# Patient Record
Sex: Male | Born: 1960 | Race: Black or African American | Hispanic: No | Marital: Married | Smoking: Former smoker
Health system: Southern US, Community
[De-identification: ages and names within clinical notes are randomized; demographics above are authoritative.]

## PROBLEM LIST (undated history)

## (undated) DIAGNOSIS — G8929 Other chronic pain: Secondary | ICD-10-CM

## (undated) DIAGNOSIS — I10 Essential (primary) hypertension: Secondary | ICD-10-CM

## (undated) DIAGNOSIS — E785 Hyperlipidemia, unspecified: Secondary | ICD-10-CM

## (undated) DIAGNOSIS — M545 Low back pain, unspecified: Secondary | ICD-10-CM

## (undated) DIAGNOSIS — K219 Gastro-esophageal reflux disease without esophagitis: Secondary | ICD-10-CM

## (undated) DIAGNOSIS — M1990 Unspecified osteoarthritis, unspecified site: Secondary | ICD-10-CM

## (undated) DIAGNOSIS — T7840XA Allergy, unspecified, initial encounter: Secondary | ICD-10-CM

## (undated) DIAGNOSIS — E119 Type 2 diabetes mellitus without complications: Secondary | ICD-10-CM

## (undated) DIAGNOSIS — J302 Other seasonal allergic rhinitis: Secondary | ICD-10-CM

## (undated) HISTORY — DX: Essential (primary) hypertension: I10

## (undated) HISTORY — DX: Type 2 diabetes mellitus without complications: E11.9

## (undated) HISTORY — PX: ANKLE SURGERY: SHX546

## (undated) HISTORY — DX: Allergy, unspecified, initial encounter: T78.40XA

## (undated) HISTORY — PX: JOINT REPLACEMENT: SHX530

## (undated) HISTORY — PX: DENTAL SURGERY: SHX609

## (undated) HISTORY — DX: Hyperlipidemia, unspecified: E78.5

---

## 1999-07-02 ENCOUNTER — Emergency Department (HOSPITAL_COMMUNITY): Admission: EM | Admit: 1999-07-02 | Discharge: 1999-07-02 | Payer: Self-pay | Admitting: Internal Medicine

## 2001-10-23 ENCOUNTER — Encounter: Payer: Self-pay | Admitting: Emergency Medicine

## 2001-10-23 ENCOUNTER — Emergency Department (HOSPITAL_COMMUNITY): Admission: EM | Admit: 2001-10-23 | Discharge: 2001-10-23 | Payer: Self-pay | Admitting: Emergency Medicine

## 2002-04-20 ENCOUNTER — Ambulatory Visit (HOSPITAL_COMMUNITY): Admission: RE | Admit: 2002-04-20 | Discharge: 2002-04-20 | Payer: Self-pay | Admitting: Family Medicine

## 2002-04-20 ENCOUNTER — Encounter: Payer: Self-pay | Admitting: Family Medicine

## 2004-08-06 ENCOUNTER — Emergency Department (HOSPITAL_COMMUNITY): Admission: EM | Admit: 2004-08-06 | Discharge: 2004-08-06 | Payer: Self-pay | Admitting: *Deleted

## 2004-09-19 ENCOUNTER — Ambulatory Visit: Payer: Self-pay | Admitting: Internal Medicine

## 2004-10-09 ENCOUNTER — Ambulatory Visit (HOSPITAL_COMMUNITY): Admission: RE | Admit: 2004-10-09 | Discharge: 2004-10-09 | Payer: Self-pay | Admitting: Orthopedic Surgery

## 2004-10-09 ENCOUNTER — Ambulatory Visit (HOSPITAL_BASED_OUTPATIENT_CLINIC_OR_DEPARTMENT_OTHER): Admission: RE | Admit: 2004-10-09 | Discharge: 2004-10-09 | Payer: Self-pay | Admitting: Orthopedic Surgery

## 2004-11-01 ENCOUNTER — Encounter: Admission: RE | Admit: 2004-11-01 | Discharge: 2004-12-10 | Payer: Self-pay | Admitting: Orthopedic Surgery

## 2005-03-05 ENCOUNTER — Encounter: Admission: RE | Admit: 2005-03-05 | Discharge: 2005-03-22 | Payer: Self-pay | Admitting: Orthopedic Surgery

## 2005-08-03 ENCOUNTER — Emergency Department (HOSPITAL_COMMUNITY): Admission: EM | Admit: 2005-08-03 | Discharge: 2005-08-03 | Payer: Self-pay | Admitting: Emergency Medicine

## 2005-08-05 ENCOUNTER — Ambulatory Visit: Payer: Self-pay | Admitting: Internal Medicine

## 2006-02-19 ENCOUNTER — Emergency Department (HOSPITAL_COMMUNITY): Admission: EM | Admit: 2006-02-19 | Discharge: 2006-02-19 | Payer: Self-pay | Admitting: Family Medicine

## 2006-02-27 ENCOUNTER — Ambulatory Visit: Payer: Self-pay | Admitting: Internal Medicine

## 2006-05-07 ENCOUNTER — Ambulatory Visit: Payer: Self-pay | Admitting: Internal Medicine

## 2006-07-04 ENCOUNTER — Emergency Department (HOSPITAL_COMMUNITY): Admission: EM | Admit: 2006-07-04 | Discharge: 2006-07-04 | Payer: Self-pay | Admitting: Family Medicine

## 2006-10-07 ENCOUNTER — Ambulatory Visit: Payer: Self-pay | Admitting: Internal Medicine

## 2006-12-30 HISTORY — PX: ANKLE ARTHROSCOPY W/ OPEN REPAIR: SHX1145

## 2007-11-17 ENCOUNTER — Emergency Department (HOSPITAL_COMMUNITY): Admission: EM | Admit: 2007-11-17 | Discharge: 2007-11-17 | Payer: Self-pay | Admitting: Emergency Medicine

## 2007-12-07 ENCOUNTER — Emergency Department (HOSPITAL_COMMUNITY): Admission: EM | Admit: 2007-12-07 | Discharge: 2007-12-07 | Payer: Self-pay | Admitting: Emergency Medicine

## 2008-01-04 ENCOUNTER — Emergency Department (HOSPITAL_COMMUNITY): Admission: EM | Admit: 2008-01-04 | Discharge: 2008-01-04 | Payer: Self-pay | Admitting: *Deleted

## 2008-01-08 ENCOUNTER — Ambulatory Visit: Payer: Self-pay | Admitting: *Deleted

## 2008-01-08 ENCOUNTER — Ambulatory Visit: Payer: Self-pay | Admitting: Internal Medicine

## 2008-01-20 ENCOUNTER — Ambulatory Visit: Payer: Self-pay | Admitting: Internal Medicine

## 2008-01-20 LAB — CONVERTED CEMR LAB
BUN: 18 mg/dL (ref 6–23)
CO2: 23 meq/L (ref 19–32)
Cholesterol: 205 mg/dL — ABNORMAL HIGH (ref 0–200)
Glucose, Bld: 97 mg/dL (ref 70–99)
LDL Cholesterol: 109 mg/dL — ABNORMAL HIGH (ref 0–99)
Potassium: 4.6 meq/L (ref 3.5–5.3)
VLDL: 19 mg/dL (ref 0–40)

## 2008-03-15 ENCOUNTER — Ambulatory Visit: Payer: Self-pay | Admitting: Internal Medicine

## 2008-09-14 ENCOUNTER — Ambulatory Visit: Payer: Self-pay | Admitting: Internal Medicine

## 2008-12-14 ENCOUNTER — Ambulatory Visit: Payer: Self-pay | Admitting: Internal Medicine

## 2008-12-14 LAB — CONVERTED CEMR LAB
BUN: 13 mg/dL (ref 6–23)
Chloride: 105 meq/L (ref 96–112)
Potassium: 3.9 meq/L (ref 3.5–5.3)
Sodium: 140 meq/L (ref 135–145)

## 2009-01-24 ENCOUNTER — Emergency Department (HOSPITAL_COMMUNITY): Admission: EM | Admit: 2009-01-24 | Discharge: 2009-01-24 | Payer: Self-pay | Admitting: Emergency Medicine

## 2009-06-16 ENCOUNTER — Ambulatory Visit: Payer: Self-pay | Admitting: Family Medicine

## 2010-09-29 ENCOUNTER — Emergency Department (HOSPITAL_COMMUNITY): Admission: EM | Admit: 2010-09-29 | Discharge: 2010-09-29 | Payer: Self-pay | Admitting: Family Medicine

## 2011-01-25 ENCOUNTER — Emergency Department (HOSPITAL_COMMUNITY)
Admission: EM | Admit: 2011-01-25 | Discharge: 2011-01-25 | Payer: Self-pay | Source: Home / Self Care | Admitting: Family Medicine

## 2011-03-18 ENCOUNTER — Emergency Department (HOSPITAL_COMMUNITY)
Admission: EM | Admit: 2011-03-18 | Discharge: 2011-03-18 | Disposition: A | Discharge status: Home / Self Care | Payer: Self-pay | Attending: Emergency Medicine | Admitting: Emergency Medicine

## 2011-03-18 DIAGNOSIS — Q638 Other specified congenital malformations of kidney: Secondary | ICD-10-CM | POA: Insufficient documentation

## 2011-03-18 DIAGNOSIS — M25569 Pain in unspecified knee: Secondary | ICD-10-CM | POA: Insufficient documentation

## 2011-03-18 DIAGNOSIS — M109 Gout, unspecified: Secondary | ICD-10-CM | POA: Insufficient documentation

## 2011-03-18 DIAGNOSIS — M7989 Other specified soft tissue disorders: Secondary | ICD-10-CM | POA: Insufficient documentation

## 2011-03-18 DIAGNOSIS — I10 Essential (primary) hypertension: Secondary | ICD-10-CM | POA: Insufficient documentation

## 2011-05-17 NOTE — Op Note (Signed)
[name], [name]               ACCOUNT NO.:  [id number]   MEDICAL RECORD NO.:  [id number]          PATIENT TYPE:  AMB   LOCATION:  DSC                          FACILITY:  [facility]   PHYSICIAN:  Leonides Grills, M.D.     DATE OF BIRTH:  [birth year]   DATE OF PROCEDURE:  10/09/2004  DATE OF DISCHARGE:                                 OPERATIVE REPORT   PREOPERATIVE DIAGNOSES:  1.  Right ankle impingement.  2.  Right tibial spurs.  3.  Right talar spurs.   POSTOPERATIVE DIAGNOSES:  1.  Right ankle impingement.  2.  Right tibial spurs.  3.  Right talar spurs.   OPERATION:  1.  Right ankle arthroscopy with extensive debridement.  2.  Excision, right tibial spurs.  3.  Excision, right talar spurs.  4.  Stress x-rays, right ankle.   ANESTHESIA:  General endotracheal tube.   SURGEON:  Leonides Grills, M.D.   ASSISTANT:  Lianne Cure, P.A.   ESTIMATED BLOOD LOSS:  Minimal.   TOURNIQUET:  None.   DISPOSITION:  Stable to the PAR.   INDICATIONS:  This is a 50 year old male who has had longstanding  progressive anterior ankle pain that was interfering with his life to the  point that he could not do what he wants to do.  He was consented for the  above procedure.  All risks, which include infection, nerve or vessel  injury, persistent pain, worsening pain, stiffness, arthritis, and possible  future surgery, which included fusion, were all explained, and questions  were encouraged and answered.   OPERATION:  The patient was brought to the operating room and placed in  supine position after adequate general endotracheal tube anesthesia was  administered as well as Ancef 1 g IV piggyback.  The right lower extremity  was then prepped and draped in a sterile manner over a proximally-placed  thigh tourniquet, which was not used, after a bump was placed on the  ipsilateral hip and the leg was placed on an arthroscopy leg holder.  We  started the procedure by mapping out the anatomical  landmarks, including  anterior tibialis peroneus tertius, and superficial peroneal nerve.  A  spinal needle was then placed through the anterior medial aspect of the  ankle just medial to the anterior tibialis.  Normal saline 20 mL was placed  in the ankle and nick and spread technique was utilized to create the  anterior medial portal.  A blunt-tipped trocar with cannula followed by  camera was then placed in the ankle under direct visualization.  The  anterolateral portal was then created using a spinal needle just lateral to  the peroneus tertius and superficial peroneal nerve and a nick and spread  technique was then utilized to create the portal as well.  There was a  tremendous amount of synovitis in the entire anterior aspect of the ankle.  This was then extensively debrided with a bevel as well as a shaver,  including the entire anterior aspect of the ankle as well as the gutters.  The cartilage had a calcinosis-type appearance similar to gout  as well as  post-cortisone injection.  Once this was extensively debrided and spurs in  both the tibia and talus were exposed nicely, we then used a bur to bur down  the spurs.  This took quite a bit of time to get each individual spur on  both the distal anterior tibia as well as dorsal talar neck.  Stress x-rays  were obtained with the mini C-arm in dorsiflexion and that showed that the  ankle was adequately decompressed.  All bony fragments were obviously  removed from the joint.  Hemostasis was obtained.  Pictures were obtained  throughout the procedure as well.  Cameras were removed.  The wounds were  closed with 4-0 nylon suture.  A sterile dressing was applied.  A Cam walker  boot was applied.  The patient was stable to the PAR.       PB/MEDQ  D:  10/09/2004  T:  10/09/2004  Job:  [id number]

## 2012-01-15 ENCOUNTER — Encounter: Payer: Self-pay | Admitting: Gastroenterology

## 2012-01-22 ENCOUNTER — Ambulatory Visit (AMBULATORY_SURGERY_CENTER): Payer: Self-pay | Admitting: *Deleted

## 2012-01-22 VITALS — Ht 71.0 in | Wt 241.2 lb

## 2012-01-22 DIAGNOSIS — Z1211 Encounter for screening for malignant neoplasm of colon: Secondary | ICD-10-CM

## 2012-01-22 MED ORDER — MOVIPREP 100 G PO SOLR: ORAL | Status: DC

## 2012-01-28 ENCOUNTER — Telehealth: Payer: Self-pay | Admitting: Gastroenterology

## 2012-01-28 NOTE — Telephone Encounter (Signed)
Spoke with pt's wife Angelique Blonder). Pt is unable to afford the Moviprep. Advised wife to come to our office to pick up the voucher for a free Moviprep. She understood and will come pick up the voucher today. She will call back if they have further questions. Ulis Rias RN

## 2012-01-28 NOTE — Telephone Encounter (Signed)
No answer at home number.   Left message on cell number.   

## 2012-02-03 ENCOUNTER — Encounter: Payer: Self-pay | Admitting: Gastroenterology

## 2012-02-03 ENCOUNTER — Ambulatory Visit (AMBULATORY_SURGERY_CENTER): Payer: Self-pay | Admitting: Gastroenterology

## 2012-02-03 VITALS — BP 134/95 | HR 79 | Temp 98.3°F | Resp 13 | Ht 71.0 in | Wt 241.0 lb

## 2012-02-03 DIAGNOSIS — D126 Benign neoplasm of colon, unspecified: Secondary | ICD-10-CM

## 2012-02-03 DIAGNOSIS — Z1211 Encounter for screening for malignant neoplasm of colon: Secondary | ICD-10-CM

## 2012-02-03 MED ORDER — SODIUM CHLORIDE 0.9 % IV SOLN: 500.0000 mL | INTRAVENOUS | Status: DC

## 2012-02-03 NOTE — Patient Instructions (Signed)
FOLLOW DISCHARGE INSTRUCTIONS (BLUE AND GREEN SHEETS).. 

## 2012-02-03 NOTE — Progress Notes (Signed)
Patient did not experience any of the following events: a burn prior to discharge; a fall within the facility; wrong site/side/patient/procedure/implant event; or a hospital transfer or hospital admission upon discharge from the facility. (G8907) Patient did not have preoperative order for IV antibiotic SSI prophylaxis. (G8918)  

## 2012-02-03 NOTE — Op Note (Signed)
[facility] [address] [place], [place]  [address]  COLONOSCOPY PROCEDURE REPORT  PATIENT:  [name], [name]  MR#:  [id number] BIRTHDATE:  07/25/1961, 50 yrs. old  GENDER:  male ENDOSCOPIST:  Barbette Hair. Arlyce Dice, MD REF. BY:  Dow Adolph, M.D. PROCEDURE DATE:  02/03/2012 PROCEDURE:  Colonoscopy with snare polypectomy ASA CLASS:  Class I INDICATIONS:  Routine Risk Screening MEDICATIONS:   MAC sedation, administered by CRNA propofol 350mg IV  DESCRIPTION OF PROCEDURE:   After the risks benefits and alternatives of the procedure were thoroughly explained, informed consent was obtained.  Digital rectal exam was performed and revealed no abnormalities.   The [id number] endoscope was introduced through the anus and advanced to the cecum, which was identified by both the appendix and ileocecal valve, without limitations.  The quality of the prep was excellent, using MoviPrep.  The instrument was then slowly withdrawn as the colon was fully examined. <<PROCEDUREIMAGES>>  FINDINGS:  A sessile polyp was found in the ascending colon. It was 2 - 3 mm in size. Polyp was snared without cautery. Retrieval was successful (see image1). snare polyp  A sessile polyp was found in the descending colon. It was 4 mm in size. Polyp was snared without cautery. Retrieval was successful (see image3). snare polyp  This was otherwise a normal examination of the colon (see image2 and image4).   Retroflexed views in the rectum revealed no abnormalities.    The time to cecum =  1) 1.50 minutes. The scope was then withdrawn in  1) 12.0  minutes from the cecum and the procedure completed. COMPLICATIONS:  None ENDOSCOPIC IMPRESSION: 1) 2 - 3 mm sessile polyp in the ascending colon 2) 4 mm sessile polyp in the descending colon 3) Otherwise normal examination RECOMMENDATIONS: 1) If the polyp(s) removed today are proven to be adenomatous (pre-cancerous) polyps, you will need a repeat colonoscopy  in 5 years. Otherwise you should continue to follow colorectal cancer screening guidelines for "routine risk" patients with colonoscopy in 10 years. REPEAT EXAM:   You will receive a letter from Dr. Arlyce Dice in 1-2 weeks, after reviewing the final pathology, with followup recommendations.  ______________________________ Barbette Hair Arlyce Dice, MD  CC:  n. eSIGNED:   Barbette Hair. Kaplan at 02/03/2012 02:44 PM  Harder, Gaspar Garbe, [phone]

## 2012-02-04 ENCOUNTER — Telehealth: Payer: Self-pay

## 2012-02-04 NOTE — Telephone Encounter (Signed)
  Follow up Call-  Call back number 02/03/2012  Post procedure Call Back phone  # [phone] hm  Permission to leave phone message Yes     Patient questions:  Do you have a fever, pain , or abdominal swelling? no Pain Score  0 *  Have you tolerated food without any problems? no  Have you been able to return to your normal activities? yes  Do you have any questions about your discharge instructions: Diet   no Medications  no Follow up visit  no  Do you have questions or concerns about your Care? no  Actions: * If pain score is 4 or above: No action needed, pain <4.  I spoke with the pt's wife he had left for the morning.  No problems noted. Maw

## 2012-02-10 ENCOUNTER — Encounter: Payer: Self-pay | Admitting: Gastroenterology

## 2012-05-20 ENCOUNTER — Encounter (HOSPITAL_COMMUNITY): Payer: Self-pay | Admitting: *Deleted

## 2012-05-20 ENCOUNTER — Emergency Department (HOSPITAL_COMMUNITY)
Admission: EM | Admit: 2012-05-20 | Discharge: 2012-05-20 | Disposition: A | Discharge status: Home / Self Care | Payer: Self-pay | Source: Home / Self Care | Attending: Family Medicine | Admitting: Family Medicine

## 2012-05-20 DIAGNOSIS — K047 Periapical abscess without sinus: Secondary | ICD-10-CM

## 2012-05-20 MED ORDER — AMOXICILLIN 500 MG PO CAPS: 500.0000 mg | ORAL_CAPSULE | Freq: Three times a day (TID) | ORAL | Status: AC

## 2012-05-20 MED ORDER — AMOXICILLIN 500 MG PO CAPS: 500.0000 mg | ORAL_CAPSULE | Freq: Three times a day (TID) | ORAL | Status: DC

## 2012-05-20 NOTE — ED Notes (Signed)
Pt with c/o right upper tooth pain onset x 2 days - taking ibuprofen without relief - does not have a dentist -

## 2012-05-20 NOTE — ED Provider Notes (Signed)
History     CSN: [id number]  Arrival date & time 05/20/12  1315   First MD Initiated Contact with Patient 05/20/12 1359      Chief Complaint  Patient presents with  . Dental Pain  . Facial Swelling    (Consider location/radiation/quality/duration/timing/severity/associated sxs/prior treatment) Patient is a 51 y.o. male presenting with tooth pain. The history is provided by the patient. No language interpreter was used.  Dental PainThe primary symptoms include mouth pain. The symptoms began 2 days ago. The symptoms are worsening. The symptoms are new. The symptoms occur constantly.  Additional symptoms include: gum swelling and gum tenderness.  Pt request antibiotic.  Pt reports swelling to upper mouth.    Past Medical History  Diagnosis Date  . Allergy   . Hypertension   . Gout     Past Surgical History  Procedure Date  . Ankle arthroscopy w/ open repair 2008    both ankles    Family History  Problem Relation Age of Onset  . Heart disease Mother   . Diabetes Mother   . Liver disease Father   . Diabetes Sister   . Colon polyps Neg Hx   . Colon cancer Neg Hx   . Esophageal cancer Neg Hx   . Stomach cancer Neg Hx     History  Substance Use Topics  . Smoking status: Current Everyday Smoker -- 0.4 packs/day for 32 years    Types: Cigarettes  . Smokeless tobacco: Former Neurosurgeon  . Alcohol Use: 14.4 oz/week    24 Cans of beer per week      Review of Systems  Allergies  Review of patient's allergies indicates no known allergies.  Home Medications   Current Outpatient Rx  Name Route Sig Dispense Refill  . ALLOPURINOL 100 MG PO TABS Oral Take 100 mg by mouth daily at 2 PM daily at 2 PM. Takes two tablets daily    . COLCHICINE PO Oral Take by mouth.    Marland Kitchen DILTIAZEM HCL ER BEADS 120 MG PO CP24 Oral Take 120 mg by mouth daily.    Marland Kitchen LORATADINE 10 MG PO TABS Oral Take 10 mg by mouth daily.    . INDOMETHACIN 25 MG PO CAPS Oral Take 25 mg by mouth 3 (three) times  daily with meals as needed.      BP 156/106  Pulse 86  Temp(Src) 98.6 F (37 C) (Oral)  Resp 16  SpO2 98%  Physical Exam  Nursing note and vitals reviewed. Constitutional: He appears well-developed and well-nourished.  HENT:  Head: Normocephalic and atraumatic.  Right Ear: External ear normal.  Left Ear: External ear normal.  Nose: Nose normal.  Mouth/Throat: Oropharynx is clear and moist.       Swollen right upper gumline  Eyes: Conjunctivae and EOM are normal. Pupils are equal, round, and reactive to light.  Neck: Normal range of motion. Neck supple.  Cardiovascular: Normal rate.   Pulmonary/Chest: Effort normal.    ED Course  Procedures (including critical care time)  Labs Reviewed - No data to display No results found.   1. Dental abscess       MDM  Rx for amoxicillian.          Lonia Skinner [place], [place] 05/20/12 1505

## 2012-05-20 NOTE — ED Notes (Signed)
Pt did not take bp medication this morning

## 2012-05-20 NOTE — Discharge Instructions (Signed)
Abscessed Tooth A tooth abscess is a collection of infected fluid (pus) from a bacterial infection in the inner part of the tooth (pulp). It usually occurs at the end of the tooth's root.  CAUSES   A very bad cavity (extensive tooth decay).   Trauma to the tooth, such as a broken or chipped tooth, that allows bacteria to enter into the pulp.  SYMPTOMS  Severe pain in and around the infected tooth.   Swelling and redness around the abscessed tooth or in the mouth or face.   Tenderness.   Pus drainage.   Bad breath.   Bitter taste in the mouth.   Difficulty swallowing.   Difficulty opening the mouth.   Feeling sick to your stomach (nauseous).   Vomiting.   Chills.   Swollen neck glands.  DIAGNOSIS  A medical and dental history will be taken.   An examination will be performed by tapping on the abscessed tooth.   X-rays may be taken of the tooth to identify the abscess.  TREATMENT The goal of treatment is to eliminate the infection.   You may be prescribed antibiotic medicine to stop the infection from spreading.   A root canal may be performed to save the tooth. If the tooth cannot be saved, it may be pulled (extracted) and the abscess may be drained.  HOME CARE INSTRUCTIONS  Only take over-the-counter or prescription medicines for pain, fever, or discomfort as directed by your caregiver.   Do not drive after taking pain medicine (narcotics).   Rinse your mouth (gargle) often with salt water ( tsp salt in 8 oz of warm water) to relieve pain or swelling.   Do not apply heat to the outside of your face.   Return to your dentist for further treatment as directed.  SEEK IMMEDIATE DENTAL CARE IF:  You have a temperature by mouth above 102 F (38.9 C), not controlled by medicine.   You have chills or a very bad headache.   You have problems breathing or swallowing.   Your have trouble opening your mouth.   You develop swelling in the neck or around the eye.    Your pain is not helped by medicine.   Your pain is getting worse instead of better.  Document Released: 12/16/2005 Document Revised: 12/05/2011 Document Reviewed: 03/26/2011 ExitCare Patient Information 2012 [employer].Dental Abscess A dental abscess usually starts from an infected tooth. Antibiotic medicine and pain pills can be helpful, but dental infections require the attention of a dentist. Rinse around the infected area often with salt water (a pinch of salt in 8 oz of warm water). Do not apply heat to the outside of your face. See your dentist or oral surgeon as soon as possible.  SEEK IMMEDIATE MEDICAL CARE IF:  You have increasing, severe pain that is not relieved by medicine.   You or your child has an oral temperature above 102 F (38.9 C), not controlled by medicine.   Your baby is older than 3 months with a rectal temperature of 102 F (38.9 C) or higher.   Your baby is 3 months old or younger with a rectal temperature of 100.4 F (38 C) or higher.   You develop chills, severe headache, difficulty breathing, or trouble swallowing.   You have swelling in the neck or around the eye.  Document Released: 12/16/2005 Document Revised: 12/05/2011 Document Reviewed: 05/27/2007 Exit[employer] Patient Information 2012 [employer]. 

## 2012-06-11 NOTE — ED Provider Notes (Signed)
Medical screening examination/treatment/procedure(s) were performed by resident physician or non-physician practitioner and as supervising physician I was immediately available for consultation/collaboration.   Barkley Bruns MD.    Linna Hoff, MD 06/11/12 [phone]

## 2013-04-12 ENCOUNTER — Emergency Department (HOSPITAL_COMMUNITY)
Admission: EM | Admit: 2013-04-12 | Discharge: 2013-04-12 | Disposition: A | Discharge status: Home Health | Payer: No Typology Code available for payment source | Source: Home / Self Care

## 2013-04-12 ENCOUNTER — Encounter (HOSPITAL_COMMUNITY): Payer: Self-pay | Admitting: *Deleted

## 2013-04-12 DIAGNOSIS — L0390 Cellulitis, unspecified: Secondary | ICD-10-CM

## 2013-04-12 MED ORDER — TRAMADOL HCL 50 MG PO TABS: 50.0000 mg | ORAL_TABLET | Freq: Four times a day (QID) | ORAL | Status: DC | PRN

## 2013-04-12 MED ORDER — DOXYCYCLINE HYCLATE 100 MG PO TABS: 100.0000 mg | ORAL_TABLET | Freq: Two times a day (BID) | ORAL | Status: DC

## 2013-04-12 NOTE — ED Notes (Signed)
Patient complains of right knee pain that has gotten worse in the last week. Patient states he usually takes an injection for his knee.

## 2013-04-12 NOTE — ED Provider Notes (Signed)
History     CSN: [id number]  Arrival date & time 04/12/13  1019   First MD Initiated Contact with Patient 04/12/13 1040      Chief Complaint  Patient presents with  . Knee Pain    (Consider location/radiation/quality/duration/timing/severity/associated sxs/prior treatment) HPI Patient is a 52 year old male who presents to clinic with main concern of right lower extremity swelling, tenderness to touch, redness and warmth to touch, started one week prior to this visit and has been progressively worse. 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 Patient explains that pain is all, constant, 10 out of 10 in severity, extending from ankle to knee, no similar events in the past, worse with ambulation.  Past Medical History  Diagnosis Date  . Allergy   . Hypertension   . Gout     Past Surgical History  Procedure Laterality Date  . Ankle arthroscopy w/ open repair  2008    both ankles    Family History  Problem Relation Age of Onset  . Heart disease Mother   . Diabetes Mother   . Liver disease Father   . Diabetes Sister   . Colon polyps Neg Hx   . Colon cancer Neg Hx   . Esophageal cancer Neg Hx   . Stomach cancer Neg Hx     History  Substance Use Topics  . Smoking status: Current Every Day Smoker -- 0.40 packs/day for 32 years    Types: Cigarettes  . Smokeless tobacco: Former Neurosurgeon  . Alcohol Use: 14.4 oz/week    24 Cans of beer per week      Review of Systems  Constitutional: Positive for fever, chills, activity change, and fatigue.  HENT: Negative for ear pain, nosebleeds, congestion, facial swelling, rhinorrhea, neck pain, neck stiffness and ear discharge.   Eyes: Negative for pain, discharge, redness, itching and visual disturbance.  Respiratory: Negative for cough, choking, chest tightness, shortness of breath, wheezing and stridor.   Cardiovascular: Negative for chest  pain, palpitations and leg swelling.  Gastrointestinal: Negative for abdominal distention.  Genitourinary: Negative for dysuria, urgency, frequency, hematuria, flank pain, decreased urine volume, difficulty urinating and dyspareunia.  Musculoskeletal: Negative for back pain Neurological: Negative for dizziness, tremors, seizures, syncope, facial asymmetry, speech difficulty, weakness, light-headedness, numbness and headaches.  Hematological: Negative for adenopathy. Does not bruise/bleed easily.  Psychiatric/Behavioral: Negative for hallucinations, behavioral problems, confusion, dysphoric mood, decreased concentration and agitation.    Allergies  Review of patient's allergies indicates no known allergies.  Home Medications   Current Outpatient Rx  Name  Route  Sig  Dispense  Refill  . allopurinol (ZYLOPRIM) 100 MG tablet   Oral   Take 100 mg by mouth daily at 2 PM daily at 2 PM. Takes two tablets daily         . COLCHICINE PO   Oral   Take by mouth.         . diltiazem (TIAZAC) 120 MG 24 hr capsule   Oral   Take 120 mg by mouth daily.         . indomethacin (INDOCIN) 25 MG capsule   Oral   Take 25 mg by mouth 3 (three) times daily with meals as needed.         . loratadine (CLARITIN) 10 MG tablet   Oral   Take 10 mg by mouth daily.         Marland Kitchen  doxycycline (VIBRA-TABS) 100 MG tablet   Oral   Take 1 tablet (100 mg total) by mouth 2 (two) times daily.   20 tablet   0   . traMADol (ULTRAM) 50 MG tablet   Oral   Take 1 tablet (50 mg total) by mouth every 6 (six) hours as needed for pain.   65 tablet   0     BP 152/108  Pulse 83  Temp(Src) 98.8 F (37.1 C) (Oral)  Resp 14  SpO2 98%  Physical Exam  Constitutional: Appears well-developed and well-nourished. No distress.  HENT: Normocephalic. External right and left ear normal. Oropharynx is clear and moist.  Eyes: Conjunctivae and EOM are normal. PERRLA, no scleral icterus.  Neck: Normal ROM. Neck  supple. No JVD. No tracheal deviation. No thyromegaly.  CVS: RRR, S1/S2 +, no murmurs, no gallops, no carotid bruit.  Pulmonary: Effort and breath sounds normal, no stridor, rhonchi, wheezes, rales.  Abdominal: Soft. BS +,  no distension, tenderness, rebound or guarding.  Musculoskeletal: Normal range of motion. Right lower extremity swelling, erythema, tenderness to palpation and 2+ pitting edema extending from the knee to the ankle  Lymphadenopathy: No lymphadenopathy noted, cervical, inguinal. Neuro: Alert. Normal reflexes, muscle tone coordination. No cranial nerve deficit. Skin: Skin is warm and dry. No rash noted. Not diaphoretic. No erythema. No pallor.  Psychiatric: Normal mood and affect. Behavior, judgment, thought content normal.    ED Course  Procedures (including critical care time)  Labs Reviewed - No data to display No results found.   1. Cellulitis    - Symptoms and physical exam findings consistent with cellulitis - Will treat with course of doxycycline 100 mg twice a day - I will also prescribe tramadol for symptomatic relief of pain - Patient advised to come back and see [place] if the symptoms do not improve or get were   MDM  Cellulitis, course of Doxycycline      Dorothea Ogle, MD 04/12/13 1100

## 2013-04-15 ENCOUNTER — Encounter (HOSPITAL_COMMUNITY): Payer: Self-pay

## 2013-04-15 ENCOUNTER — Emergency Department (HOSPITAL_COMMUNITY): Payer: Self-pay

## 2013-04-15 ENCOUNTER — Emergency Department (INDEPENDENT_AMBULATORY_CARE_PROVIDER_SITE_OTHER)
Admission: EM | Admit: 2013-04-15 | Discharge: 2013-04-15 | Disposition: A | Discharge status: Nursing Home (Includes ICF) | Payer: No Typology Code available for payment source | Source: Home / Self Care

## 2013-04-15 ENCOUNTER — Emergency Department (HOSPITAL_COMMUNITY)
Admission: EM | Admit: 2013-04-15 | Discharge: 2013-04-15 | Disposition: A | Discharge status: Home / Self Care | Payer: Self-pay | Attending: Emergency Medicine | Admitting: Emergency Medicine

## 2013-04-15 ENCOUNTER — Encounter (HOSPITAL_COMMUNITY): Payer: Self-pay | Admitting: Emergency Medicine

## 2013-04-15 DIAGNOSIS — I10 Essential (primary) hypertension: Secondary | ICD-10-CM | POA: Insufficient documentation

## 2013-04-15 DIAGNOSIS — F17200 Nicotine dependence, unspecified, uncomplicated: Secondary | ICD-10-CM | POA: Insufficient documentation

## 2013-04-15 DIAGNOSIS — M109 Gout, unspecified: Secondary | ICD-10-CM | POA: Insufficient documentation

## 2013-04-15 DIAGNOSIS — R21 Rash and other nonspecific skin eruption: Secondary | ICD-10-CM | POA: Insufficient documentation

## 2013-04-15 DIAGNOSIS — M1310 Monoarthritis, not elsewhere classified, unspecified site: Secondary | ICD-10-CM

## 2013-04-15 DIAGNOSIS — Z79899 Other long term (current) drug therapy: Secondary | ICD-10-CM | POA: Insufficient documentation

## 2013-04-15 DIAGNOSIS — M25579 Pain in unspecified ankle and joints of unspecified foot: Secondary | ICD-10-CM | POA: Insufficient documentation

## 2013-04-15 DIAGNOSIS — Z9889 Other specified postprocedural states: Secondary | ICD-10-CM | POA: Insufficient documentation

## 2013-04-15 LAB — CBC WITH DIFFERENTIAL/PLATELET
Basophils Absolute: 0 10*3/uL (ref 0.0–0.1)
Basophils Relative: 0 % (ref 0–1)
Eosinophils Relative: 0 % (ref 0–5)
Lymphocytes Relative: 22 % (ref 12–46)
MCHC: 36.3 g/dL — ABNORMAL HIGH (ref 30.0–36.0)
Monocytes Absolute: 1.4 10*3/uL — ABNORMAL HIGH (ref 0.1–1.0)
Neutro Abs: 9.3 10*3/uL — ABNORMAL HIGH (ref 1.7–7.7)
Platelets: 382 10*3/uL (ref 150–400)
RDW: 13.2 % (ref 11.5–15.5)
WBC: 13.9 10*3/uL — ABNORMAL HIGH (ref 4.0–10.5)

## 2013-04-15 LAB — SYNOVIAL CELL COUNT + DIFF, W/ CRYSTALS: Other Cells-SYN: 0

## 2013-04-15 LAB — POCT I-STAT, CHEM 8
Calcium, Ion: 1.19 mmol/L (ref 1.12–1.23)
Chloride: 99 mEq/L (ref 96–112)
Creatinine, Ser: 1.1 mg/dL (ref 0.50–1.35)
Glucose, Bld: 107 mg/dL — ABNORMAL HIGH (ref 70–99)
HCT: 44 % (ref 39.0–52.0)
Potassium: 4 mEq/L (ref 3.5–5.1)

## 2013-04-15 MED ORDER — LIDOCAINE-EPINEPHRINE 2 %-1:100000 IJ SOLN
20.0000 mL | Freq: Once | INTRAMUSCULAR | Status: AC
  Administered 2013-04-15: 20 mL
  Filled 2013-04-15: qty 20

## 2013-04-15 MED ORDER — HYDROMORPHONE HCL PF 1 MG/ML IJ SOLN
1.0000 mg | Freq: Once | INTRAMUSCULAR | Status: AC
  Administered 2013-04-15: 1 mg via INTRAVENOUS
  Filled 2013-04-15: qty 1

## 2013-04-15 NOTE — ED Notes (Signed)
EDP at bedside  

## 2013-04-15 NOTE — ED Notes (Signed)
30mL clear yellow liquid drawn off of right knee by Dr. Noe Gens. Pt tolerated well.

## 2013-04-15 NOTE — ED Notes (Signed)
Pt comfortable with d/c instructions 

## 2013-04-15 NOTE — Progress Notes (Signed)
Patient Demographics  [name], is a 52 y.o. male  ION:[id number]  KGM:[id number]  DOB - [birth year]  Chief Complaint  Patient presents with  .  worsening right knee pain in spite of being on scheduled indomethacin, colchicine and doxycycline.  Now has pain in the left knee and in the bilateral ankle area         Subjective:   [name] today is here for a follow up vist, he was recently seen for right knee pain and swelling and was given doxycycline for presumed cellulitis. He has a history of gout and is on colchicine and indomethacin. Since his last visit, he has had worsening pain, swelling and redness of his right knee. This is in spite of doxycycline and NSAID therapy. He claims that he now has some tenderness and swelling in his left knee and bilateral ankle area. He is unable to ambulate.  Patient has No headache, No chest pain, No abdominal pain - No Nausea, No new weakness tingling or numbness, No Cough - SOB.   Objective:    Filed Vitals:   04/15/13 1558  BP: 123/92  Pulse: 99  Temp: 97.8 F (36.6 C)  TempSrc: Oral  Resp: 16  SpO2: 99%     ALLERGIES:  No Known Allergies  PAST MEDICAL HISTORY: Past Medical History  Diagnosis Date  . Allergy   . Hypertension   . Gout     MEDICATIONS AT HOME: Prior to Admission medications   Medication Sig Start Date End Date Taking? Authorizing Provider  allopurinol (ZYLOPRIM) 100 MG tablet Take 100 mg by mouth daily at 2 PM daily at 2 PM. Takes two tablets daily    Historical Provider, MD  COLCHICINE PO Take by mouth.    Historical Provider, MD  diltiazem (TIAZAC) 120 MG 24 hr capsule Take 120 mg by mouth daily.    Historical Provider, MD  doxycycline (VIBRA-TABS) 100 MG tablet Take 1 tablet (100 mg total) by mouth 2 (two) times daily. 04/12/13   Dorothea Ogle, MD  indomethacin (INDOCIN) 25 MG capsule Take 25 mg by mouth 3 (three) times daily with meals as needed.    Historical Provider, MD  loratadine (CLARITIN)  10 MG tablet Take 10 mg by mouth daily.    Historical Provider, MD  traMADol (ULTRAM) 50 MG tablet Take 1 tablet (50 mg total) by mouth every 6 (six) hours as needed for pain. 04/12/13   Dorothea Ogle, MD     Exam  General appearance :Awake, alert, not in any distress. Speech Clear. Not toxic Looking HEENT: Atraumatic and Normocephalic, pupils equally reactive to light and accomodation Neck: supple, no JVD. No cervical lymphadenopathy.  Chest:Good air entry bilaterally, no added sounds  CVS: S1 S2 regular, no murmurs.  Abdomen: Bowel sounds present, Non tender and not distended with no gaurding, rigidity or rebound. Extremities: B/L Lower Ext shows no edema. Left knee-warm, mildly tender and with effusion/swelling. Right knee- mild swelling. Mild swelling in the bilateral lower ankles as well. Neurology: Awake alert, and oriented X 3, CN II-XII intact, Non focal Skin:No Rash Wounds:N/A    Data Review   CBC No results found for this basename: WBC, HGB, HCT, PLT, MCV, MCH, MCHC, RDW, NEUTRABS, LYMPHSABS, MONOABS, EOSABS, BASOSABS, BANDABS, BANDSABD,  in the last 168 hours  Chemistries   No results found for this basename: NA, K, CL, CO2, GLUCOSE, BUN, CREATININE, GFRCGP, CALCIUM, MG, AST, ALT, ALKPHOS, BILITOT,  in the last 168 hours ------------------------------------------------------------------------------------------------------------------ No results  found for this basename: HGBA1C,  in the last 72 hours ------------------------------------------------------------------------------------------------------------------ No results found for this basename: CHOL, HDL, LDLCALC, TRIG, CHOLHDL, LDLDIRECT,  in the last 72 hours ------------------------------------------------------------------------------------------------------------------ No results found for this basename: TSH, T4TOTAL, FREET3, T3FREE, THYROIDAB,  in the last 72  hours ------------------------------------------------------------------------------------------------------------------ No results found for this basename: VITAMINB12, FOLATE, FERRITIN, TIBC, IRON, RETICCTPCT,  in the last 72 hours  Coagulation profile  No results found for this basename: INR, PROTIME,  in the last 168 hours    Assessment & Plan   Active Problems:  #1 Right knee pain and swelling - Suspect acute gouty arthritis-however and not responsive to colchicine and indomethacin therapy. Patient now unable to ambulate.  We'll need to send to the emergency room for further evaluation-may need intra-articular steroids or systemic steroids after a diagnostic arthrocentesis to make sure he does not have septic arthritis.    Follow-up Information   Follow up with [facility]. Schedule an appointment as soon as possible for a visit in 2 weeks.

## 2013-04-15 NOTE — ED Notes (Signed)
Pt sent here from [facility] for eval for possible infected right knee; pt sts thinks it could be gout but having increasing bilateral knee pain

## 2013-04-15 NOTE — ED Notes (Signed)
Patient complains of a gout flair up in both legs Has gotten worse

## 2013-04-15 NOTE — ED Provider Notes (Signed)
History     CSN: [id number]  Arrival date & time 04/15/13  1642   First MD Initiated Contact with Patient 04/15/13 1913      Chief Complaint  Patient presents with  . Knee Pain    Patient is a 52 y.o. male presenting with knee pain, ankle pain, and rash. The history is provided by the patient.  Knee Pain Location:  Knee Time since incident:  4 days Injury: no   Knee location:  R knee and L knee Pain details:    Quality:  Aching   Radiates to:  Does not radiate   Severity:  Moderate   Onset quality:  Gradual   Timing:  Constant   Progression:  Worsening Chronicity:  Recurrent (History of gout) Dislocation: no   Foreign body present:  No foreign bodies Prior injury to area:  No Worsened by:  Activity Associated symptoms: no fever and no neck pain   Risk factors comment:  History of Gout Ankle Pain Location:  Ankle Time since incident:  4 days Injury: no   Ankle location:  R ankle Pain details:    Quality:  Aching   Radiates to:  Does not radiate   Severity:  Moderate   Onset quality:  Gradual   Duration:  4 days Prior injury to area:  No Associated symptoms: no fever and no neck pain   Rash Location: right shin. Severity:  Moderate Onset quality:  Gradual Duration:  4 days Progression:  Improving Chronicity:  New Associated symptoms: joint pain (right knee, left knee, right ankle)   Associated symptoms: no abdominal pain, no diarrhea, no fever, no headaches, no nausea, no shortness of breath, not vomiting and not wheezing     Past Medical History  Diagnosis Date  . Allergy   . Hypertension   . Gout     Past Surgical History  Procedure Laterality Date  . Ankle arthroscopy w/ open repair  2008    both ankles    Family History  Problem Relation Age of Onset  . Heart disease Mother   . Diabetes Mother   . Liver disease Father   . Diabetes Sister   . Colon polyps Neg Hx   . Colon cancer Neg Hx   . Esophageal cancer Neg Hx   . Stomach cancer Neg  Hx     History  Substance Use Topics  . Smoking status: Current Every Day Smoker -- 0.40 packs/day for 32 years    Types: Cigarettes  . Smokeless tobacco: Former Neurosurgeon  . Alcohol Use: 14.4 oz/week    24 Cans of beer per week      Review of Systems  Constitutional: Negative for fever, chills, diaphoresis, activity change and appetite change.  HENT: Negative for neck pain.   Respiratory: Negative for cough, chest tightness, shortness of breath and wheezing.   Cardiovascular: Negative for chest pain, palpitations and leg swelling.  Gastrointestinal: Negative for nausea, vomiting, abdominal pain, diarrhea and constipation.  Musculoskeletal: Positive for joint swelling (right knee, left knee, right ankle) and arthralgias (right knee, left knee, right ankle).  Skin: Positive for rash. Negative for wound.  Neurological: Negative for dizziness, seizures, syncope, weakness, light-headedness, numbness and headaches.  All other systems reviewed and are negative.    Allergies  Review of patient's allergies indicates no known allergies.  Home Medications   Current Outpatient Rx  Name  Route  Sig  Dispense  Refill  . allopurinol (ZYLOPRIM) 100 MG tablet   Oral  Take 100 mg by mouth daily.          Marland Kitchen diltiazem (TIAZAC) 120 MG 24 hr capsule   Oral   Take 120 mg by mouth daily.         Marland Kitchen doxycycline (VIBRA-TABS) 100 MG tablet   Oral   Take 1 tablet (100 mg total) by mouth 2 (two) times daily.   20 tablet   0   . indomethacin (INDOCIN) 25 MG capsule   Oral   Take 25 mg by mouth 3 (three) times daily with meals.          Marland Kitchen loratadine (CLARITIN) 10 MG tablet   Oral   Take 10 mg by mouth daily.         . traMADol (ULTRAM) 50 MG tablet   Oral   Take 50 mg by mouth every 6 (six) hours as needed for pain.           BP 110/68  Pulse 100  Temp(Src) 98.6 F (37 C) (Oral)  Resp 20  SpO2 96%  Physical Exam  Nursing note and vitals reviewed. Constitutional: He  appears well-developed and well-nourished.  HENT:  Head: Normocephalic and atraumatic.  Right Ear: External ear normal.  Left Ear: External ear normal.  Nose: Nose normal.  Mouth/Throat: Oropharynx is clear and moist. No oropharyngeal exudate.  Eyes: Conjunctivae are normal. Pupils are equal, round, and reactive to light.  Neck: Normal range of motion. Neck supple.  Cardiovascular: Normal rate, regular rhythm, normal heart sounds and intact distal pulses.   Pulmonary/Chest: Effort normal and breath sounds normal. No respiratory distress. He has no wheezes. He has no rales. He exhibits no tenderness.  Abdominal: Soft. Bowel sounds are normal. He exhibits no distension and no mass. There is no tenderness. There is no rebound and no guarding.  Musculoskeletal: He exhibits edema (right knee, left knee, right ankle) and tenderness (right knee, left knee, right ankle).  Neurological: He is alert. He displays normal reflexes. No cranial nerve deficit. He exhibits normal muscle tone. Coordination normal.  Skin: Skin is warm and dry. Rash (mild erythema overlying right shin) noted. There is erythema. No pallor.  Psychiatric: He has a normal mood and affect. His behavior is normal. Judgment and thought content normal.    ED Course  ARTHOCENTESIS Date/Time: 04/15/2013 8:29 PM Performed by: Clemetine Marker Authorized by: Joya Gaskins Consent: written consent obtained. Risks and benefits: risks, benefits and alternatives were discussed Consent given by: patient Patient understanding: patient states understanding of the procedure being performed Patient consent: the patient's understanding of the procedure matches consent given Procedure consent: procedure consent matches procedure scheduled Relevant documents: relevant documents present and verified Test results: test results available and properly labeled Patient identity confirmed: arm band, hospital-assigned identification number and verbally  with patient Time out: Immediately prior to procedure a "time out" was called to verify the correct patient, procedure, equipment, support staff and site/side marked as required. Indications: joint swelling, pain, possible septic joint and diagnostic evaluation  Body area: knee Joint: right knee Local anesthesia used: yes Anesthesia: local infiltration Local anesthetic: lidocaine 1% with epinephrine Anesthetic total: 3 ml Patient sedated: no Preparation: Patient was prepped and draped in the usual sterile fashion. Needle gauge: 20 G Approach: medial Aspirate: serous Aspirate amount: 30 ml Patient tolerance: Patient tolerated the procedure well with no immediate complications.   (including critical care time)  Labs Reviewed  CBC WITH DIFFERENTIAL - Abnormal; Notable for the following:  WBC 13.9 (*)    MCHC 36.3 (*)    Neutro Abs 9.3 (*)    Monocytes Absolute 1.4 (*)    All other components within normal limits  POCT I-STAT, CHEM 8 - Abnormal; Notable for the following:    Glucose, Bld 107 (*)    All other components within normal limits  BODY FLUID CULTURE  HEMOGLOBINOPATHY EVALUATION  BODY FLUID CRYSTAL  CELL COUNT + DIFF,  W/ CRYST-SYNVL FLD   Dg Knee Complete 4 Views Left  04/15/2013  *RADIOLOGY REPORT*  Clinical Data: Left knee pain and swelling.  LEFT KNEE - COMPLETE 4+ VIEW  Comparison: None.  Findings: There is an extensive inhomogeneous sclerotic lesion in the distal femoral shaft, most likely a large bone infarct.  There is no cortical disruption. The proximal extent of the lesion is not appreciable.  The patient has slight medial joint space narrowing as well as tiny osteophytes on the patella.  There is a joint effusion.  IMPRESSION:  1.  No acute osseous abnormality. 2.  Arthritic changes of the medial patellofemoral compartments. 3.  Small joint effusion. 4.  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  Does the patient have a history of sickle cell disease or  steroid use or other disorders that that might create a bone infarction?   Original Report Authenticated By: Francene Boyers, M.D.    Dg Knee Complete 4 Views Right  04/15/2013  *RADIOLOGY REPORT*  Clinical Data: Right knee pain and swelling.  RIGHT KNEE - COMPLETE 4+ VIEW  Comparison: Left knee radiographs dated 04/15/2013  Findings: The patient has an extensive infarct in the distal right femur as well as a small infarct in the proximal right tibia.  The there is a small exostosis of the anterior medial aspect of the proximal right tibia.  Minimal medial joint space narrowing and small osteophytes on the patella.  Large joint effusion.  IMPRESSION:  1.  Multiple bone infarcts. 2.  Large joint effusion. 3.  Minimal arthritic changes of the medial patellofemoral compartments. 4.  The presence of bilateral knee joint effusions in the absence of trauma suggests systemic arthritis/synovitis.   Original Report Authenticated By: Francene Boyers, M.D.      1. Gouty arthritis      MDM  53 yo M w/hx of gout presents for continued bilateral knee and right ankle pain. Recently diagnosed with cellulitis of right shin; on Doxycyline. Pt reports cellulitis has improved since he started taking antibiotics. AFVSS. Effusions on exam of right knee/ankle and left knee. Arthrocentesis performed (see procedure note) and consistent with gout and not septic arthritis. Analgesia administered. Pt counseled to use prescribed Indomethacin and Tramadol for gout and Doxycycline for cellulitis. Patient placed in knee immobilizer, given return precautions, including worsening of signs or symptoms. Patient instructed to follow-up with primary care physician.          Clemetine Marker, MD 04/15/13 2252

## 2013-04-16 NOTE — ED Provider Notes (Signed)
Labs tests c/w gouty arthritis, but pt will require outpatient followup and monitoring Stable for d/c home  Joya Gaskins, MD 04/16/13 1818

## 2013-04-16 NOTE — ED Provider Notes (Signed)
I have personally seen and examined the patient and discussed plan of care with the resident.  I was present for entire procedure.  I have reviewed the appropriate documentation on PMH/FH/Soc. History.  I have reviewed the documentation of the resident and agree.      Joya Gaskins, MD 04/16/13 [phone]

## 2013-04-19 LAB — HEMOGLOBINOPATHY EVALUATION
Hgb A2 Quant: 2.4 % (ref 2.2–3.2)
Hgb A: 97.6 % (ref 96.8–97.8)
Hgb S Quant: 0 %

## 2013-04-19 LAB — BODY FLUID CULTURE: Culture: NO GROWTH

## 2013-04-20 ENCOUNTER — Emergency Department (HOSPITAL_COMMUNITY)
Admission: EM | Admit: 2013-04-20 | Discharge: 2013-04-20 | Disposition: A | Discharge status: Home / Self Care | Payer: Self-pay | Attending: Emergency Medicine | Admitting: Emergency Medicine

## 2013-04-20 ENCOUNTER — Encounter (HOSPITAL_COMMUNITY): Payer: Self-pay | Admitting: *Deleted

## 2013-04-20 DIAGNOSIS — L02419 Cutaneous abscess of limb, unspecified: Secondary | ICD-10-CM | POA: Insufficient documentation

## 2013-04-20 DIAGNOSIS — I10 Essential (primary) hypertension: Secondary | ICD-10-CM | POA: Insufficient documentation

## 2013-04-20 DIAGNOSIS — M25579 Pain in unspecified ankle and joints of unspecified foot: Secondary | ICD-10-CM | POA: Insufficient documentation

## 2013-04-20 DIAGNOSIS — M7989 Other specified soft tissue disorders: Secondary | ICD-10-CM | POA: Insufficient documentation

## 2013-04-20 DIAGNOSIS — F17200 Nicotine dependence, unspecified, uncomplicated: Secondary | ICD-10-CM | POA: Insufficient documentation

## 2013-04-20 DIAGNOSIS — Z9889 Other specified postprocedural states: Secondary | ICD-10-CM | POA: Insufficient documentation

## 2013-04-20 DIAGNOSIS — Z96659 Presence of unspecified artificial knee joint: Secondary | ICD-10-CM | POA: Insufficient documentation

## 2013-04-20 DIAGNOSIS — M109 Gout, unspecified: Secondary | ICD-10-CM | POA: Insufficient documentation

## 2013-04-20 DIAGNOSIS — Z79899 Other long term (current) drug therapy: Secondary | ICD-10-CM | POA: Insufficient documentation

## 2013-04-20 MED ORDER — ALLOPURINOL 100 MG PO TABS: 100.0000 mg | ORAL_TABLET | Freq: Every day | ORAL | Status: DC

## 2013-04-20 MED ORDER — HYDROCODONE-ACETAMINOPHEN 5-325 MG PO TABS: 1.0000 | ORAL_TABLET | Freq: Four times a day (QID) | ORAL | Status: DC | PRN

## 2013-04-20 MED ORDER — INDOMETHACIN 25 MG PO CAPS: 25.0000 mg | ORAL_CAPSULE | Freq: Three times a day (TID) | ORAL | Status: DC | PRN

## 2013-04-20 NOTE — ED Notes (Signed)
Pt was treated for cellulitis in knees and has had antbx.  Pt feel this is gout.  Pt is not diabetic.

## 2013-04-20 NOTE — ED Provider Notes (Signed)
History     CSN: [id number]  Arrival date & time 04/20/13  1244   First MD Initiated Contact with Patient 04/20/13 1340      Chief Complaint  Patient presents with  . Ankle Pain  . Knee Pain    (Consider location/radiation/quality/duration/timing/severity/associated sxs/prior treatment) HPI Comments: Patient with a history of Gout presents with a chief complaint of bilateral knee pain and ankle pain.  Pain has been present for the past couple of weeks.  He reports that he continues to have pain, but the swelling of the joints has improved.  No erythema of the joints at this time.  He was seen in the ED five days ago and had arthrocentesis done of the knee, which showed results consistent with Gout.  He was discharged home with Indomethicin, which he reports that he has been taking.  He states that in the past he was on Allupurinol chronically, but recently ran out of the medication.  He is requesting more Allupurinol and also pain medication.  He was taking Ultram without relief.  He denies fever or chills.    Patient is a 52 y.o. male presenting with ankle pain and knee pain. The history is provided by the patient.  Ankle Pain Associated symptoms: no fever   Knee Pain Associated symptoms: no fever     Past Medical History  Diagnosis Date  . Allergy   . Hypertension   . Gout     Past Surgical History  Procedure Laterality Date  . Ankle arthroscopy w/ open repair  2008    both ankles    Family History  Problem Relation Age of Onset  . Heart disease Mother   . Diabetes Mother   . Liver disease Father   . Diabetes Sister   . Colon polyps Neg Hx   . Colon cancer Neg Hx   . Esophageal cancer Neg Hx   . Stomach cancer Neg Hx     History  Substance Use Topics  . Smoking status: Current Every Day Smoker -- 0.40 packs/day for 32 years    Types: Cigarettes  . Smokeless tobacco: Former Neurosurgeon  . Alcohol Use: 14.4 oz/week    24 Cans of beer per week      Review of  Systems  Constitutional: Negative for fever and chills.  Musculoskeletal:       Bilateral knee pain Left ankle pain  All other systems reviewed and are negative.    Allergies  Review of patient's allergies indicates no known allergies.  Home Medications   Current Outpatient Rx  Name  Route  Sig  Dispense  Refill  . allopurinol (ZYLOPRIM) 100 MG tablet   Oral   Take 100 mg by mouth daily.          Marland Kitchen diltiazem (TIAZAC) 120 MG 24 hr capsule   Oral   Take 120 mg by mouth daily.         Marland Kitchen doxycycline (VIBRA-TABS) 100 MG tablet   Oral   Take 1 tablet (100 mg total) by mouth 2 (two) times daily.   20 tablet   0   . indomethacin (INDOCIN) 25 MG capsule   Oral   Take 25 mg by mouth 3 (three) times daily with meals.          Marland Kitchen loratadine (CLARITIN) 10 MG tablet   Oral   Take 10 mg by mouth daily.         . traMADol (ULTRAM) 50 MG tablet  Oral   Take 50 mg by mouth every 6 (six) hours as needed for pain.           BP 131/87  Pulse 104  Temp(Src) 98 F (36.7 C) (Oral)  Resp 18  SpO2 98%  Physical Exam  Nursing note and vitals reviewed. Constitutional: He appears well-developed and well-nourished. No distress.  HENT:  Head: Normocephalic and atraumatic.  Neck: Normal range of motion. Neck supple.  Cardiovascular: Normal rate, regular rhythm and normal heart sounds.   Pulses:      Dorsalis pedis pulses are 2+ on the right side, and 2+ on the left side.  Pulmonary/Chest: Effort normal and breath sounds normal.  Musculoskeletal:  Mild swelling of both knees.  No erythema of the knees visualized.  Full ROM of both knees.  Mild swelling of the left ankle.  No erythema visualized.  Full ROM of the left ankle.  Neurological: He is alert. Gait normal.  Skin: Skin is warm and dry. He is not diaphoretic.  Psychiatric: He has a normal mood and affect.    ED Course  Procedures (including critical care time)  Labs Reviewed - No data to display No results  found.   No diagnosis found.    MDM  52 yo M w/hx of gout presents for continued bilateral knee and right ankle pain. Recently diagnosed with cellulitis of right shin; on Doxycyline. Cellulitis appears to have improved at this time.  Patient recently seen in the ED 5 days ago and arthrocentesis performed and synovial fluid cultured.   Synovial fluid showed uric acid crystals.  Patient had been on Indomethacin.  Patient presenting today requesting stronger pain medication and wanted refill of Allupurinol.  Patient given short course of pain medication and Allupurinol.  Patient instructed to follow up with PCP.            Pascal Lux Wheeler, PA-C 04/21/13 1745

## 2013-04-22 NOTE — ED Provider Notes (Signed)
Medical screening examination/treatment/procedure(s) were performed by non-physician practitioner and as supervising physician I was immediately available for consultation/collaboration.  Taylin Leder, MD 04/22/13 0950 

## 2013-04-30 ENCOUNTER — Ambulatory Visit (INDEPENDENT_AMBULATORY_CARE_PROVIDER_SITE_OTHER): Payer: No Typology Code available for payment source | Admitting: Internal Medicine

## 2013-04-30 ENCOUNTER — Encounter: Payer: Self-pay | Admitting: Internal Medicine

## 2013-04-30 ENCOUNTER — Ambulatory Visit: Payer: Self-pay

## 2013-04-30 VITALS — BP 126/84 | HR 81 | Temp 97.1°F | Ht 71.0 in | Wt 217.5 lb

## 2013-04-30 DIAGNOSIS — M109 Gout, unspecified: Secondary | ICD-10-CM | POA: Insufficient documentation

## 2013-04-30 DIAGNOSIS — I10 Essential (primary) hypertension: Secondary | ICD-10-CM | POA: Insufficient documentation

## 2013-04-30 LAB — URIC ACID: Uric Acid, Serum: 5.6 mg/dL (ref 4.0–7.8)

## 2013-04-30 MED ORDER — ALLOPURINOL 100 MG PO TABS: 100.0000 mg | ORAL_TABLET | Freq: Every day | ORAL | Status: DC

## 2013-04-30 MED ORDER — DILTIAZEM HCL ER BEADS 120 MG PO CP24: 120.0000 mg | ORAL_CAPSULE | Freq: Every day | ORAL | Status: DC

## 2013-04-30 NOTE — Progress Notes (Signed)
  Subjective:    Patient ID: Reichen Hutzler, male    DOB: 06/06/1961, 52 y.o.   MRN: [id number]  HPI patient is a pleasant 52 year old man with gout and hypertension who comes the clinic for first visit to get established as a new patient.  He had recent flareup of his gout and was seen at urgent care twice in April. Arthrocentesis of right knee showed gout effusion but no septic joint. He was out of his allopurinol before gout flareup happened. Usually he gets a flareup every 2 years.  He also had right leg cellulitis during this time which was treated with doxycycline for 10 days. He has resolution of his cellulitis now.  He still complains of bilateral knee pain and has minimal swelling, which makes it difficult for him to walk and work as he is supposed to do.   He denies any fever, chills, nausea, vomiting abdominal pain, chest pain, short of breath, diarrhea.  He needs refills for allopurinol and diltiazem.  Review of Systems    as per history of present illness. Objective:   Physical Exam  General: NAD HEENT: PERRL, EOMI, no scleral icterus Cardiac: S1, S2, RRR, no rubs, murmurs or gallops Pulm: clear to auscultation bilaterally, moving normal volumes of air Abd: soft, nontender, nondistended, BS present Ext: warm and well perfused. Minimal bilateral knee swelling with minimal lateral tenderness to palpation. No redness. Decrease in range of motion- cannot fully flex the knee joints. Neuro: alert and oriented X3, cranial nerves II-XII grossly intact       Assessment & Plan:

## 2013-04-30 NOTE — Assessment & Plan Note (Signed)
BP Readings from Last 3 Encounters:  04/30/13 126/84  04/20/13 131/86  04/15/13 137/74    Lab Results  Component Value Date   NA 137 04/15/2013   K 4.0 04/15/2013   CREATININE 1.10 04/15/2013    Assessment: Blood pressure control:  At goal Progress toward BP goal:    Comments: continue diltiazem  Plan: Medications:  continue current medications Educational resources provided:   Self management tools provided:   Other plans: prescription sent to pharmacy.

## 2013-04-30 NOTE — Patient Instructions (Addendum)
Please continue taking indomethacin as needed 3 times daily for gout flareup. Continue the allopurinol.  I will call you with results if the dose of allopurinol needs to be increased.  Prescriptions for allopurinol and diltiazem are in pharmacy

## 2013-04-30 NOTE — Assessment & Plan Note (Signed)
Patient has gout for about 10 years.  Is on allopurinol. - prolonged flareup this time.  - Check uric acid levels and adjust allopurinol dose accordingly. - Advised him to start cutting on alcohol intake- to prevent gout attacks. - Continue indomethacin for one more week for symptom relief.

## 2013-05-06 NOTE — Progress Notes (Signed)
Case discussed with Dr. Patel immediately after the resident saw the patient.  We reviewed the resident's history and exam and pertinent patient test results.  I agree with the assessment, diagnosis and plan of care documented in the resident's note. 

## 2013-06-07 ENCOUNTER — Ambulatory Visit: Payer: No Typology Code available for payment source

## 2013-06-22 ENCOUNTER — Encounter: Payer: Self-pay | Admitting: Internal Medicine

## 2013-06-22 ENCOUNTER — Telehealth: Payer: Self-pay | Admitting: *Deleted

## 2013-06-22 ENCOUNTER — Ambulatory Visit (INDEPENDENT_AMBULATORY_CARE_PROVIDER_SITE_OTHER): Payer: No Typology Code available for payment source | Admitting: Internal Medicine

## 2013-06-22 VITALS — BP 144/98 | HR 98 | Temp 98.2°F | Ht 71.0 in | Wt 225.1 lb

## 2013-06-22 DIAGNOSIS — M109 Gout, unspecified: Secondary | ICD-10-CM

## 2013-06-22 LAB — BASIC METABOLIC PANEL
CO2: 28 mEq/L (ref 19–32)
Chloride: 106 mEq/L (ref 96–112)
Creat: 0.9 mg/dL (ref 0.50–1.35)
Potassium: 4.4 mEq/L (ref 3.5–5.3)
Sodium: 141 mEq/L (ref 135–145)

## 2013-06-22 MED ORDER — KETOROLAC TROMETHAMINE 60 MG/2ML IM SOLN
60.0000 mg | Freq: Once | INTRAMUSCULAR | Status: AC
  Administered 2013-06-22: 60 mg via INTRAMUSCULAR

## 2013-06-22 NOTE — Telephone Encounter (Signed)
Pt and family came to clinic - problems with gout and has been out of work - needs note. Appt today with Dr Dierdre Searles 3:15PM. Mahin Guardia RN 06/22/13 2:20PM

## 2013-06-22 NOTE — Progress Notes (Signed)
  Subjective:    Patient ID: [name], male    DOB: Apr 20, 1961, 52 y.o.   MRN: [id number]  HPI  Dx with gout 15 years ago.  Most current pain X 2 days.  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  After these kinds of things, he normally has flares of his gout.  Exactly same as previous gout attacks.  Left knee, left ankle.  Left big toe is improved today.    H/O damage to joints by sports in the past.   Mostly treated with allopurinol over the years.  4 flares in the past half year, which is typical for him.  Indomethacin not helping.  Toradol shots have helped the most in the past.   Drinks lots of beer.  Old habit 12 pack per day of 24 oz beer.  Slowly decreasing to 12 pack every 2-3 days ([place] beer).  (~36 beers per week).  Loves red meat, but tries to stay away from it recently.  He had a flare after shrimp, and he is trying to stay away from this food.  He drinks a lot of fruit juice, which has decreased his water intake.  He is willing to attempt to drink more water.   He is smoking.    Review of Systems  Constitutional: Negative for fever, chills and activity change.  HENT: Negative for hearing loss and ear pain.   Eyes: Negative for pain and visual disturbance.  Respiratory: Negative for cough and shortness of breath.   Cardiovascular: Negative for chest pain and leg swelling.  Gastrointestinal: Negative for abdominal pain, diarrhea and constipation.  Genitourinary: Negative for dysuria and difficulty urinating.  Musculoskeletal: Positive for joint swelling (due to gout), arthralgias and gait problem (due to pain).  Skin: Positive for color change (erythema at knee and ankle on the left).  Neurological: Negative for dizziness, light-headedness and headaches.  Psychiatric/Behavioral: Negative for dysphoric mood and decreased concentration.       Objective:   Physical Exam  Constitutional: He is oriented to person, place, and time. He appears  well-developed and well-nourished. No distress.  HENT:  Head: Normocephalic and atraumatic.  Eyes: Conjunctivae are normal. No scleral icterus.  Cardiovascular: Normal rate, regular rhythm and normal heart sounds.   Pulmonary/Chest: Effort normal and breath sounds normal.  Abdominal: Soft. Bowel sounds are normal.  Musculoskeletal:       Left elbow: He exhibits normal range of motion, no swelling and no effusion.       Left knee: He exhibits decreased range of motion, swelling and erythema. He exhibits no effusion.       Left ankle: He exhibits decreased range of motion and swelling. He exhibits normal pulse. Tenderness.       Arms: Neurological: He is alert and oriented to person, place, and time.  Skin: Skin is warm and dry. There is erythema (at left knee and left ankle).  Psychiatric: He has a normal mood and affect. His behavior is normal.       Assessment & Plan:

## 2013-06-22 NOTE — Patient Instructions (Addendum)
You were seen in the [facility]. Please follow up with your primary care doctor, Dr. Burtis Junes, in one month to follow up your gout.  For your pain today, we gave you an injection of Tordol (ketorolac). Your gout is very likely still a problem in part because of your diet. We spoke today about a number of different ways to avoid gout triggers. The goals that we set for you before your next visit are:  Instead of fruit juices, you will start drinking more water. One of the ways you mentioned that you can do this is to start bringing a gallon jug of water to work.  You have done a great job with cutting back on your beer drinking. You mentioned that you think it is possible to get down to just 1 1/2 to 2 12-packs a week. This will be our goal, but if you think you can cut back more then you may do so.  You were sent to get blood work done today to look at your kidney function. We will contact you with these results in the next few days.   Gout  Gout is an inflammatory condition (arthritis) caused by a buildup of uric acid crystals in the joints. Uric acid is a chemical that is normally present in the blood. Under some circumstances, uric acid can form into crystals in your joints. This causes joint redness, soreness, and swelling (inflammation). Repeat attacks are common. Over time, uric acid crystals can form into masses (tophi) near a joint, causing disfigurement. Gout is treatable and often preventable. CAUSES  The disease begins with elevated levels of uric acid in the blood. Uric acid is produced by your body when it breaks down a naturally found substance called purines. This also happens when you eat certain foods such as meats and fish. Causes of an elevated uric acid level include:  Being passed down from parent to child (heredity).  Diseases that cause increased uric acid production (obesity, psoriasis, some cancers).  Excessive alcohol use.  Diet, especially diets rich in  meat and seafood.  Medicines, including certain cancer-fighting drugs (chemotherapy), diuretics, and aspirin.  Chronic kidney disease. The kidneys are no longer able to remove uric acid well.  Problems with metabolism. Conditions strongly associated with gout include:  Obesity.  High blood pressure.  High cholesterol.  Diabetes. Not everyone with elevated uric acid levels gets gout. It is not understood why some people get gout and others do not. Surgery, joint injury, and eating too much of certain foods are some of the factors that can lead to gout. SYMPTOMS   An attack of gout comes on quickly. It causes intense pain with redness, swelling, and warmth in a joint.  Fever can occur.  Often, only one joint is involved. Certain joints are more commonly involved:  Base of the big toe.  Knee.  Ankle.  Wrist.  Finger. Without treatment, an attack usually goes away in a few days to weeks. Between attacks, you usually will not have symptoms, which is different from many other forms of arthritis. DIAGNOSIS  Your caregiver will suspect gout based on your symptoms and exam. Removal of fluid from the joint (arthrocentesis) is done to check for uric acid crystals. Your caregiver will give you a medicine that numbs the area (local anesthetic) and use a needle to remove joint fluid for exam. Gout is confirmed when uric acid crystals are seen in joint fluid, using a special microscope. Sometimes, blood, urine, and X-ray  tests are also used. TREATMENT  There are 2 phases to gout treatment: treating the sudden onset (acute) attack and preventing attacks (prophylaxis). Treatment of an Acute Attack  Medicines are used. These include anti-inflammatory medicines or steroid medicines.  An injection of steroid medicine into the affected joint is sometimes necessary.  The painful joint is rested. Movement can worsen the arthritis.  You may use warm or cold treatments on painful joints,  depending which works best for you.  Discuss the use of coffee, vitamin C, or cherries with your caregiver. These may be helpful treatment options. Treatment to Prevent Attacks After the acute attack subsides, your caregiver may advise prophylactic medicine. These medicines either help your kidneys eliminate uric acid from your body or decrease your uric acid production. You may need to stay on these medicines for a very long time. The early phase of treatment with prophylactic medicine can be associated with an increase in acute gout attacks. For this reason, during the first few months of treatment, your caregiver may also advise you to take medicines usually used for acute gout treatment. Be sure you understand your caregiver's directions. You should also discuss dietary treatment with your caregiver. Certain foods such as meats and fish can increase uric acid levels. Other foods such as dairy can decrease levels. Your caregiver can give you a list of foods to avoid. HOME CARE INSTRUCTIONS   Do not take aspirin to relieve pain. This raises uric acid levels.  Only take over-the-counter or prescription medicines for pain, discomfort, or fever as directed by your caregiver.  Rest the joint as much as possible. When in bed, keep sheets and blankets off painful areas.  Keep the affected joint raised (elevated).  Use crutches if the painful joint is in your leg.  Drink enough water and fluids to keep your urine clear or pale yellow. This helps your body get rid of uric acid. Do not drink alcoholic beverages. They slow the passage of uric acid.  Follow your caregiver's dietary instructions. Pay careful attention to the amount of protein you eat. Your daily diet should emphasize fruits, vegetables, whole grains, and fat-free or low-fat milk products.  Maintain a healthy body weight. SEEK MEDICAL CARE IF:   You have an oral temperature above 102 F (38.9 C).  You develop diarrhea, vomiting, or  any side effects from medicines.  You do not feel better in 24 hours, or you are getting worse. SEEK IMMEDIATE MEDICAL CARE IF:   Your joint becomes suddenly more tender and you have:  Chills.  An oral temperature above 102 F (38.9 C), not controlled by medicine. MAKE SURE YOU:   Understand these instructions.  Will watch your condition.  Will get help right away if you are not doing well or get worse. Document Released: 12/13/2000 Document Revised: 03/09/2012 Document Reviewed: 03/26/2010 [facility] Patient Information 2014 [place], [place].

## 2013-06-22 NOTE — Assessment & Plan Note (Addendum)
Last uric acid: 5.6 on 04/30/2013 Gout was diagnosed ~15 years ago, per patient.  Gout flare in the left knee, ankle, and big toe, likely precipitated by increased lifting activity at work and diet. His diet is concerning for alcohol intake (~36 beers a week, which is an improvement from prior baseline of 12 pack/day), red meat (which he reports to be cutting out), sugary fruit juices and minimal water (sugary drinks and dehydration). Patient has already removed shrimp from his diet.  The patient continues to have multiple gout flares every year despite therapy with allopurinol 100 mg daily. This appears to in large part be due to diet and activity. 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 In the event that this is a tophi, tophaceous gout warrants more stringent uric acid control to <5 mg/dL (per uptodate). Given his history with this physical exam finding would recommend reassessing the patient in 1 month after the current flare passes to uptitrate his allopurinol. - Tordol 60mg  IM for pain today. - BMET to assess renal function on indomethacin. Would consider increasing indomethacin from 25 mg TID as needed for pain to 50mg  TID as needed for pain in the setting of poor pain response to current dose. - Reassess in one month and strongly consider uptitrating allopurinol to goal serum uric acid of <5 mg/dL in tophaceous gout.   PATIENT GOALS - Decrease fruit juice intake. He will take a gallon jug of water to work and drink from this instead. - Attempt to decrease beer intake to at most 1.5 to 2 12-packs weekly (18-24 beers a week). This would be a small improvement from his current state.  Update by Dr. Criselda Peaches -   Agree with MS III Seraya Jobst's assesement/plan A goal serum urate of <5 mg/dL (<130 micromol/L) may be appropriate in patients with tophi, as such lower serum urate levels may speed resolution of tophi.  (uptodate).  Discussed at length dietary changes  with Mr. Richman including decreasing beer intake, decreasing red meat and also increasing water and decreasing fruit juice intake.  - Toradol today.  BMET shows normal renal function, will inform Mr. Mancinas that he can increase his indocin to 50mg  TID for any further flares.  - F/U in 1 month with PCP Dr. Burtis Junes for evaluation of increase allopurinol dose to reach goal of < 5 uric acid (since he continues to have flare and has ? Tophus on left elbow)  Advised Mr. Dauphinais that his dietary issues would likely cause him to continue to have flares of his gout, despite optimal medical management.

## 2013-06-22 NOTE — Progress Notes (Signed)
This is a Psychologist, occupational Note.  The care of the patient was discussed with Dr. Criselda Peaches and the assessment and plan was formulated with their assistance.  Please see their note for official documentation of the patient encounter.   Subjective:   Patient ID: [name] male   DOB: 10-18-1961 52 y.o.   MRN: [id number]  HPI: [name] is a 52 y.o. male with a history of hypertension, sports injury, and gout presenting with pain in his left knee, ankle, and big toe. [name] attributes his current symptoms to a gout flare, noting the similarity in this to his previous flares. The patient was diagnosed with gout 15 years ago, and has a history of monosodium urate crystals seen in the ED on joint aspiration 04/15/2013. He experiences multiple gout flares a year. Patient also has a history of joint trauma from years of football and basketball, including a surgically repaired right ankle due to basketball injury.  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 The pain and stiffness started in his left knee, then went to his left ankle and big toe. The pain does not respond to indomethacin, so the patient has used some of his remaining tramadol to help with pain. He currently cannot fully bend or flex his left knee. He has more range of motion in the left ankle and swelling has resolved from the left big toe.  He reports compliance with allopurinol but notes a diet rich in known food triggers for gout. The patient has decreased his alcohol intake from a 12-pack of beer daily to a 12-pack of beer every 2 to 3 days. He is reluctant to cut back on his drinking but acknowledges that cutting back may be worth an improvement in knee pain. He expresses a love for seafood and red meat. He has cut back on both, expressing a need to get rid of the "freezer full" of steaks and that he has already removed shrimp  from his diet. He does not drink a lot of water, instead drinking a lot of sugary fruit juices. He is willing to replace his fruit juices with more water. It is also worth noting that the patient mentions a "knot" on his left elbow which has been present for about 3 years. He does not have these bumps anywhere else.   Past Medical History  Diagnosis Date  . Allergy   . Hypertension   . Gout    Current Outpatient Prescriptions  Medication Sig Dispense Refill  . allopurinol (ZYLOPRIM) 100 MG tablet Take 1 tablet (100 mg total) by mouth daily.  30 tablet  0  . allopurinol (ZYLOPRIM) 100 MG tablet Take 1 tablet (100 mg total) by mouth daily.  30 tablet  11  . diltiazem (TIAZAC) 120 MG 24 hr capsule Take 1 capsule (120 mg total) by mouth daily.  30 capsule  5  . doxycycline (VIBRA-TABS) 100 MG tablet Take 1 tablet (100 mg total) by mouth 2 (two) times daily.  20 tablet  0  . HYDROcodone-acetaminophen (NORCO/VICODIN) 5-325 MG per tablet Take 1-2 tablets by mouth every 6 (six) hours as needed for pain.  20 tablet  0  . indomethacin (INDOCIN) 25 MG capsule Take 25 mg by mouth 3 (three) times daily with meals.       . indomethacin (INDOCIN) 25 MG capsule Take 1 capsule (25 mg total)  by mouth 3 (three) times daily as needed.  30 capsule  0  . loratadine (CLARITIN) 10 MG tablet Take 10 mg by mouth daily.      . traMADol (ULTRAM) 50 MG tablet Take 50 mg by mouth every 6 (six) hours as needed for pain.       No current facility-administered medications for this visit.   Family History  Problem Relation Age of Onset  . Heart disease Mother   . Diabetes Mother   . Liver disease Father   . Diabetes Sister   . Colon polyps Neg Hx   . Colon cancer Neg Hx   . Esophageal cancer Neg Hx   . Stomach cancer Neg Hx    History   Social History  . Marital Status: Married    Spouse Name: N/A    Number of Children: N/A  . Years of Education: N/A   Social History Main Topics  . Smoking status: Current  Every Day Smoker -- 0.40 packs/day for 32 years    Types: Cigarettes  . Smokeless tobacco: Former Neurosurgeon  . Alcohol Use: 21.6 oz/week    36 Cans of beer per week  . Drug Use: No     Comment: Used cocaine about 15 years before.  Marland Kitchen Sexually Active: None   Other Topics Concern  . None   Social History Narrative   Patient lives in [place] with his wife and 3 daughters.   He has have a total of 6 daughters.   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   Review of Systems: A comprehensive 12 point review of systems was performed and is negative except as stated above. Objective:  Physical Exam: Filed Vitals:   06/22/13 1434  BP: 144/98  Pulse: 98  Temp: 98.2 F (36.8 C)  TempSrc: Oral  Height: 5\' 11"  (1.803 m)  Weight: 225 lb 1.6 oz (102.105 kg)  SpO2: 98%   General appearance: alert, cooperative and no distress Head: Normocephalic, without obvious abnormality, atraumatic Eyes: EOMI, anicteric sclera. Throat: moist mucus membranes of oropharynx. Neck: no adenopathy, supple, symmetrical, trachea midline and thyroid not enlarged, symmetric, no tenderness/mass/nodules Lungs: clear to auscultation bilaterally and normal work of breathing. Heart: regular rate and rhythm, S1, S2 normal, no murmur, click, rub or gallop Abdomen: soft, nontender. bowel sounds present. Extremities: nontender, semi-firm 2 cm mass at left elbow. Left knee is nonerythematous, warm, boggy, and tender to palpation laterally. ROM is extremely limited but extension > flexion. Left ankle is mildly edematous, nonerythematous, nontender. ROM in ankle is nearly full. 2 cm fluctuant mass on left dorsal foot surface which is nonerythematous, not warm, nontender. Pulses: 2+ and symmetric radial pulses. Assessment & Plan:  [name] is a 52 y.o. male with a history of gout presenting with an acute flare.  Gout Last uric acid: 5.6 on 04/30/2013 Gout was diagnosed ~15 years ago, per  patient.  Gout flare in the left knee, ankle, and big toe, likely precipitated by increased lifting activity at work and diet. His diet is concerning for alcohol intake (~36 beers a week, which is an improvement from prior baseline of 12 pack/day), red meat (which he reports to be cutting out), sugary fruit juices and minimal water (sugary drinks and dehydration). Patient has already removed shrimp from his diet.  The patient continues to have multiple gout flares every year despite therapy with allopurinol 100 mg daily. This appears to in large part  be due to diet and activity. 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 In the event that this is a tophi, tophaceous gout warrants more stringent uric acid control to <5 mg/dL (per uptodate). Given his history with this physical exam finding would recommend reassessing the patient in 1 month after the current flare passes to uptitrate his allopurinol. - Tordol 60mg  IM for pain today. - BMET to assess renal function on indomethacin. Would consider increasing indomethacin from 25 mg TID as needed for pain to 50mg  TID as needed for pain in the setting of poor pain response to current dose. - Reassess in one month and strongly consider uptitrating allopurinol to goal serum uric acid of <5 mg/dL in tophaceous gout.   PATIENT GOALS - Decrease fruit juice intake. He will take a gallon jug of water to work and drink from this instead. - Attempt to decrease beer intake to at most 1.5 to 2 12-packs weekly (18-24 beers a week). This would be a small improvement from his current state.    FOLLOW UP: Return visit in 1 month.

## 2013-06-23 ENCOUNTER — Encounter: Payer: Self-pay | Admitting: Internal Medicine

## 2013-08-31 ENCOUNTER — Encounter: Payer: Self-pay | Admitting: Internal Medicine

## 2013-08-31 ENCOUNTER — Ambulatory Visit (INDEPENDENT_AMBULATORY_CARE_PROVIDER_SITE_OTHER): Payer: No Typology Code available for payment source | Admitting: Internal Medicine

## 2013-08-31 VITALS — BP 140/88 | HR 95 | Temp 97.5°F | Wt 228.2 lb

## 2013-08-31 DIAGNOSIS — F17200 Nicotine dependence, unspecified, uncomplicated: Secondary | ICD-10-CM | POA: Insufficient documentation

## 2013-08-31 DIAGNOSIS — J069 Acute upper respiratory infection, unspecified: Secondary | ICD-10-CM | POA: Insufficient documentation

## 2013-08-31 DIAGNOSIS — H6120 Impacted cerumen, unspecified ear: Secondary | ICD-10-CM

## 2013-08-31 DIAGNOSIS — H6122 Impacted cerumen, left ear: Secondary | ICD-10-CM | POA: Insufficient documentation

## 2013-08-31 DIAGNOSIS — H6123 Impacted cerumen, bilateral: Secondary | ICD-10-CM

## 2013-08-31 HISTORY — DX: Nicotine dependence, unspecified, uncomplicated: F17.200

## 2013-08-31 MED ORDER — ALBUTEROL SULFATE HFA 108 (90 BASE) MCG/ACT IN AERS: 2.0000 | INHALATION_SPRAY | Freq: Four times a day (QID) | RESPIRATORY_TRACT | Status: DC | PRN

## 2013-08-31 MED ORDER — BENZONATATE 200 MG PO CAPS: 200.0000 mg | ORAL_CAPSULE | Freq: Three times a day (TID) | ORAL | Status: DC | PRN

## 2013-08-31 MED ORDER — LORATADINE 10 MG PO TABS: 10.0000 mg | ORAL_TABLET | Freq: Every day | ORAL | Status: DC

## 2013-08-31 NOTE — Progress Notes (Signed)
Patient ID: [name], male   DOB: [birth year], 53 y.o.   MRN: [id number]   Subjective:   Patient ID: [name] male   DOB: 1961-09-17 52 y.o.   MRN: [id number]  HPI: [name] is a 52 y.o. male who presents today with a 4 day history of cough, congestion, HA, runny nose.  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 He reports frequent URIs typically this time of year.  He is also a current smoker of about 1/3 ppd. He reports he has been taking claritin with some relief but yesterday he started taking mucinex which has helped a lot to decrease his cough and help his expectorate what he coughs. Patient denies any history of COPD or asthma.   Past Medical History  Diagnosis Date  . Allergy   . Hypertension   . Gout    Current Outpatient Prescriptions  Medication Sig Dispense Refill  . allopurinol (ZYLOPRIM) 100 MG tablet Take 1 tablet (100 mg total) by mouth daily.  30 tablet  0  . allopurinol (ZYLOPRIM) 100 MG tablet Take 1 tablet (100 mg total) by mouth daily.  30 tablet  11  . benzonatate (TESSALON) 200 MG capsule Take 1 capsule (200 mg total) by mouth 3 (three) times daily as needed for cough.  20 capsule  0  . diltiazem (TIAZAC) 120 MG 24 hr capsule Take 1 capsule (120 mg total) by mouth daily.  30 capsule  5  . doxycycline (VIBRA-TABS) 100 MG tablet Take 1 tablet (100 mg total) by mouth 2 (two) times daily.  20 tablet  0  . HYDROcodone-acetaminophen (NORCO/VICODIN) 5-325 MG per tablet Take 1-2 tablets by mouth every 6 (six) hours as needed for pain.  20 tablet  0  . indomethacin (INDOCIN) 25 MG capsule Take 25 mg by mouth 3 (three) times daily with meals.       . indomethacin (INDOCIN) 25 MG capsule Take 1 capsule (25 mg total) by mouth 3 (three) times daily as needed.  30 capsule  0  . loratadine (CLARITIN) 10 MG tablet Take 1 tablet (10 mg total) by mouth daily.  30 tablet  3  . traMADol  (ULTRAM) 50 MG tablet Take 50 mg by mouth every 6 (six) hours as needed for pain.       No current facility-administered medications for this visit.   Family History  Problem Relation Age of Onset  . Heart disease Mother   . Diabetes Mother   . Liver disease Father   . Diabetes Sister   . Colon polyps Neg Hx   . Colon cancer Neg Hx   . Esophageal cancer Neg Hx   . Stomach cancer Neg Hx    History   Social History  . Marital Status: Married    Spouse Name: N/A    Number of Children: N/A  . Years of Education: N/A   Social History Main Topics  . Smoking status: Current Every Day Smoker -- 0.40 packs/day for 32 years    Types: Cigarettes  . Smokeless tobacco: Former Neurosurgeon  . Alcohol Use: 21.6 oz/week    36 Cans of beer per week  . Drug Use: No     Comment: Used cocaine about 15 years before.  Marland Kitchen Sexual Activity: None   Other Topics Concern  . None   Social History Narrative   Patient lives in [place] with his wife and 3 daughters.  He has have a total of 6 daughters.   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   Review of Systems: Review of Systems  Constitutional: Positive for fever. Negative for chills and malaise/fatigue.  HENT: Positive for congestion. Negative for sore throat.   Eyes: Negative for blurred vision and discharge.  Respiratory: Positive for cough and sputum production. Negative for hemoptysis, shortness of breath and wheezing.   Cardiovascular: Negative for chest pain, palpitations and leg swelling.  Gastrointestinal: Negative for heartburn, nausea, vomiting, abdominal pain, diarrhea and constipation.  Genitourinary: Negative for dysuria.  Musculoskeletal: Negative for myalgias.  Neurological: Negative for dizziness and headaches.    Objective:  Physical Exam: Filed Vitals:   08/31/13 1450  BP: 155/93  Pulse: 95  Temp: 97.5 F (36.4 C)  TempSrc: Oral  Weight: 228 lb 3.2 oz (103.511 kg)  SpO2: 97%    Physical Exam  Nursing note and vitals reviewed. Constitutional: He is well-developed, well-nourished, and in no distress. No distress.  HENT:  Head: Normocephalic and atraumatic.  Right Ear: Hearing and tympanic membrane normal.  Left Ear: Decreased hearing is noted.  Left ear cerumen impaction.  Eyes: Conjunctivae are normal.  Cardiovascular: Normal rate, regular rhythm, normal heart sounds and intact distal pulses.   No murmur heard. Pulmonary/Chest: Effort normal. He has wheezes.  Abdominal: Soft. Bowel sounds are normal. He exhibits no distension. There is no tenderness.  Skin: He is not diaphoretic.    Assessment & Plan:  See Problem Based Assessment and Plan

## 2013-08-31 NOTE — Patient Instructions (Signed)
1.  Continue to take Mucinex twice a day. 2.  Take Benzonatate up to 3 times a day as needed for cough. 3.  Take Loratadine 10mg  daily. 4.  Use Ventonlin Inhaler 2 puffs every 4-6 hours as needed.\ 5.  Call and make another appointment if symptoms continue for another 5-6 days. 6.  Please go to [email or web address] to register to quit smoking and to get free nicotine replacement patches.  Upper Respiratory Infection, Adult An upper respiratory infection (URI) is also sometimes known as the common cold. The upper respiratory tract includes the nose, sinuses, throat, trachea, and bronchi. Bronchi are the airways leading to the lungs. Most people improve within 1 week, but symptoms can last up to 2 weeks. A residual cough may last even longer.  CAUSES Many different viruses can infect the tissues lining the upper respiratory tract. The tissues become irritated and inflamed and often become very moist. Mucus production is also common. A cold is contagious. You can easily spread the virus to others by oral contact. This includes kissing, sharing a glass, coughing, or sneezing. Touching your mouth or nose and then touching a surface, which is then touched by another person, can also spread the virus. SYMPTOMS  Symptoms typically develop 1 to 3 days after you come in contact with a cold virus. Symptoms vary from person to person. They may include:  Runny nose.  Sneezing.  Nasal congestion.  Sinus irritation.  Sore throat.  Loss of voice (laryngitis).  Cough.  Fatigue.  Muscle aches.  Loss of appetite.  Headache.  Low-grade fever. DIAGNOSIS  You might diagnose your own cold based on familiar symptoms, since most people get a cold 2 to 3 times a year. Your caregiver can confirm this based on your exam. Most importantly, your caregiver can check that your symptoms are not due to another disease such as strep throat, sinusitis, pneumonia, asthma, or epiglottitis. Blood tests, throat  tests, and X-rays are not necessary to diagnose a common cold, but they may sometimes be helpful in excluding other more serious diseases. Your caregiver will decide if any further tests are required. RISKS AND COMPLICATIONS  You may be at risk for a more severe case of the common cold if you smoke cigarettes, have chronic heart disease (such as heart failure) or lung disease (such as asthma), or if you have a weakened immune system. The very young and very old are also at risk for more serious infections. Bacterial sinusitis, middle ear infections, and bacterial pneumonia can complicate the common cold. The common cold can worsen asthma and chronic obstructive pulmonary disease (COPD). Sometimes, these complications can require emergency medical care and may be life-threatening. PREVENTION  The best way to protect against getting a cold is to practice good hygiene. Avoid oral or hand contact with people with cold symptoms. Wash your hands often if contact occurs. There is no clear evidence that vitamin C, vitamin E, echinacea, or exercise reduces the chance of developing a cold. However, it is always recommended to get plenty of rest and practice good nutrition. TREATMENT  Treatment is directed at relieving symptoms. There is no cure. Antibiotics are not effective, because the infection is caused by a virus, not by bacteria. Treatment may include:  Increased fluid intake. Sports drinks offer valuable electrolytes, sugars, and fluids.  Breathing heated mist or steam (vaporizer or shower).  Eating chicken soup or other clear broths, and maintaining good nutrition.  Getting plenty of rest.  Using gargles or lozenges for  comfort.  Controlling fevers with ibuprofen or acetaminophen as directed by your caregiver.  Increasing usage of your inhaler if you have asthma. Zinc gel and zinc lozenges, taken in the first 24 hours of the common cold, can shorten the duration and lessen the severity of  symptoms. Pain medicines may help with fever, muscle aches, and throat pain. A variety of non-prescription medicines are available to treat congestion and runny nose. Your caregiver can make recommendations and may suggest nasal or lung inhalers for other symptoms.  HOME CARE INSTRUCTIONS   Only take over-the-counter or prescription medicines for pain, discomfort, or fever as directed by your caregiver.  Use a warm mist humidifier or inhale steam from a shower to increase air moisture. This may keep secretions moist and make it easier to breathe.  Drink enough water and fluids to keep your urine clear or pale yellow.  Rest as needed.  Return to work when your temperature has returned to normal or as your caregiver advises. You may need to stay home longer to avoid infecting others. You can also use a face mask and careful hand washing to prevent spread of the virus. SEEK MEDICAL CARE IF:   After the first few days, you feel you are getting worse rather than better.  You need your caregiver's advice about medicines to control symptoms.  You develop chills, worsening shortness of breath, or brown or red sputum. These may be signs of pneumonia.  You develop yellow or brown nasal discharge or pain in the face, especially when you bend forward. These may be signs of sinusitis.  You develop a fever, swollen neck glands, pain with swallowing, or white areas in the back of your throat. These may be signs of strep throat. SEEK IMMEDIATE MEDICAL CARE IF:   You have a fever.  You develop severe or persistent headache, ear pain, sinus pain, or chest pain.  You develop wheezing, a prolonged cough, cough up blood, or have a change in your usual mucus (if you have chronic lung disease).  You develop sore muscles or a stiff neck. Document Released: 06/11/2001 Document Revised: 03/09/2012 Document Reviewed: 04/19/2011 [facility] Patient Information 2014 [place], [place].

## 2013-08-31 NOTE — Assessment & Plan Note (Signed)
Patient appears to have typical URI.   Advised to continue to take mucinex.   Patient does have some wheezing on exam and was given Albuterol Inhaler and instructed on use.   Benzoate 200mg  TID PRN for cough Loratadine 10mg  daily was refilled. Given note for 2 days off work. Instructed to make follow up appointment if symptoms not improved or get worse in 5-6 days. Smoking cessation consuling was given, patient willing, given information for [place] [facility]uit[facility].  Encouraged to quit with his wife.

## 2013-08-31 NOTE — Assessment & Plan Note (Signed)
Patient reported some decreased hearing, exam revaled impacted cerumen.  Will disimpact in office today.

## 2013-08-31 NOTE — Assessment & Plan Note (Signed)
Patient uses 1/3 PPD willing to quit, referred to [email or web address].

## 2013-09-01 NOTE — Progress Notes (Signed)
I saw and evaluated the patient.  I personally confirmed the key portions of the history and exam documented by Dr. Hoffman and I reviewed pertinent patient test results.  The assessment, diagnosis, and plan were formulated together and I agree with the documentation in the resident's note. 

## 2013-09-03 ENCOUNTER — Telehealth: Payer: Self-pay | Admitting: *Deleted

## 2013-09-03 NOTE — Telephone Encounter (Signed)
Wife called about pt - feeling sl better - thought antibiotic would work faster. Reassured. Had no temp and cont with instructions from Dr Mikey Bussing. Will be glad to see if no change. Stanton Kidney Janiel Derhammer RN 09/03/13 4:40PM

## 2013-10-01 ENCOUNTER — Encounter: Payer: Self-pay | Admitting: Internal Medicine

## 2013-10-01 ENCOUNTER — Ambulatory Visit (INDEPENDENT_AMBULATORY_CARE_PROVIDER_SITE_OTHER): Payer: No Typology Code available for payment source | Admitting: Internal Medicine

## 2013-10-01 VITALS — BP 166/87 | HR 90 | Temp 97.7°F | Ht 71.0 in | Wt 227.3 lb

## 2013-10-01 DIAGNOSIS — J069 Acute upper respiratory infection, unspecified: Secondary | ICD-10-CM

## 2013-10-01 DIAGNOSIS — J329 Chronic sinusitis, unspecified: Secondary | ICD-10-CM

## 2013-10-01 MED ORDER — AMOXICILLIN 500 MG PO CAPS: 500.0000 mg | ORAL_CAPSULE | Freq: Three times a day (TID) | ORAL | Status: DC

## 2013-10-01 MED ORDER — ALBUTEROL SULFATE HFA 108 (90 BASE) MCG/ACT IN AERS: 2.0000 | INHALATION_SPRAY | Freq: Four times a day (QID) | RESPIRATORY_TRACT | Status: DC | PRN

## 2013-10-03 ENCOUNTER — Encounter: Payer: Self-pay | Admitting: Internal Medicine

## 2013-10-03 NOTE — Patient Instructions (Signed)
Continue using nasal saline spray and the inhaler for symptoms. Finish all the antibiotics as this is important. If you don't improve after starting or experience worsening symptoms makes an appt and come back in to be seen.   It is not a recommended to take the flu shot with your active illness. A nurse visit will be scheduled for you to take your flu shot once you are feeling better. Sinusitis Sinusitis is redness, soreness, and puffiness (inflammation) of the air pockets in the bones of your face (sinuses). The redness, soreness, and puffiness can cause air and mucus to get trapped in your sinuses. This can allow germs to grow and cause an infection.  HOME CARE   Drink enough fluids to keep your pee (urine) clear or pale yellow.  Use a humidifier in your home.  Run a hot shower to create steam in the bathroom. Sit in the bathroom with the door closed. Breathe in the steam 3 4 times a day.  Put a warm, moist washcloth on your face 3 4 times a day, or as told by your doctor.  Use salt water sprays (saline sprays) to wet the thick fluid in your nose. This can help the sinuses drain.  Only take medicine as told by your doctor. GET HELP RIGHT AWAY IF:   Your pain gets worse.  You have very bad headaches.  You are sick to your stomach (nauseous).  You throw up (vomit).  You are very sleepy (drowsy) all the time.  Your face is puffy (swollen).  Your vision changes.  You have a stiff neck.  You have trouble breathing. MAKE SURE YOU:   Understand these instructions.  Will watch your condition.  Will get help right away if you are not doing well or get worse. Document Released: 06/03/2008 Document Revised: 09/09/2012 Document Reviewed: 07/21/2012 [facility] Patient Information 2014 [place], [place].

## 2013-10-03 NOTE — Progress Notes (Signed)
Subjective:   Patient ID: [name] male   DOB: Feb 10, 1961 52 y.o.   MRN: [id number]  HPI: [name] is a 52 y.o. AA man here for acute visit in terms of ongoing congestion/URI symptoms. Pt states that for the past 1 month after being evaluated and following symptomatic management prescribed he continues to have productive cough of sometimes yellow sputum, cough, PND worse at night, sinus pressure worse with bending, HA, and some night sweats.   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 He has had no sick contacts, continues to smoke at least 3 unfiltered cigarettes daily. No CP, SOB, LE edema, or orthopnea.     Past Medical History  Diagnosis Date  . Allergy   . Hypertension   . Gout    Current Outpatient Prescriptions  Medication Sig Dispense Refill  . albuterol (PROVENTIL HFA;VENTOLIN HFA) 108 (90 BASE) MCG/ACT inhaler Inhale 2 puffs into the lungs every 6 (six) hours as needed for wheezing.  1 Inhaler  0  . allopurinol (ZYLOPRIM) 100 MG tablet Take 1 tablet (100 mg total) by mouth daily.  30 tablet  0  . amoxicillin (AMOXIL) 500 MG capsule Take 1 capsule (500 mg total) by mouth 3 (three) times daily.  21 capsule  0  . benzonatate (TESSALON) 200 MG capsule Take 1 capsule (200 mg total) by mouth 3 (three) times daily as needed for cough.  20 capsule  0  . diltiazem (TIAZAC) 120 MG 24 hr capsule Take 1 capsule (120 mg total) by mouth daily.  30 capsule  5  . indomethacin (INDOCIN) 25 MG capsule Take 1 capsule (25 mg total) by mouth 3 (three) times daily as needed.  30 capsule  0  . loratadine (CLARITIN) 10 MG tablet Take 1 tablet (10 mg total) by mouth daily.  30 tablet  3   No current facility-administered medications for this visit.   Family History  Problem Relation Age of Onset  . Heart disease Mother   . Diabetes Mother   . Liver disease Father   . Diabetes Sister   . Colon polyps Neg Hx   . Colon cancer Neg Hx    . Esophageal cancer Neg Hx   . Stomach cancer Neg Hx    History   Social History  . Marital Status: Married    Spouse Name: N/A    Number of Children: N/A  . Years of Education: N/A   Social History Main Topics  . Smoking status: Current Every Day Smoker -- 0.40 packs/day for 32 years    Types: Cigarettes  . Smokeless tobacco: Former Neurosurgeon     Comment: PATIENT STATES HE HAS CUT BACK SOME  . Alcohol Use: 21.6 oz/week    36 Cans of beer per week  . Drug Use: No     Comment: Used cocaine about 15 years before.  Marland Kitchen Sexual Activity: Not on file   Other Topics Concern  . Not on file   Social History Narrative   Patient lives in [place] with his wife and 3 daughters.   He has have a total of 6 daughters.   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   Review of Systems: Otherwise negative unless listed in HPI  Objective:  Physical Exam: Filed Vitals:   10/01/13 1525  BP: 166/87  Pulse: 90  Temp: 97.7 F (36.5 C)  TempSrc: Oral  Height: 5\' 11"  (1.803 m)  Weight: 227 lb 4.8 oz (103.103 kg)  SpO2: 96%   General: sitting in chair, speech sounds congested HEENT: PERRL, EOMI, no scleral icterus, conjunctivae injected, bulging TM with clear fluid, no erythema or cerumen Cardiac: RRR, no rubs, murmurs or gallops Pulm: LL with expiratory wheezing, no crackles or rhonci, moving normal volumes of air Abd: soft, nontender, nondistended, BS present Ext: warm and well perfused, no pedal edema Neuro: alert and oriented X3, cranial nerves II-XII grossly intact   Assessment & Plan:  1. URI/sinusitis: pt has had symptoms for 1 month with no relief from symptomatic management.  amoxicillin 500mg  TID for 7 days -no flu shot today as actively sick will schedule nurse visit  Pt discussed with Dr. Dalphine Handing

## 2013-10-04 NOTE — Progress Notes (Signed)
Case discussed with Dr. Sadek at the time of the visit.  We reviewed the resident's history and exam and pertinent patient test results.  I agree with the assessment, diagnosis, and plan of care documented in the resident's note. 

## 2014-01-21 ENCOUNTER — Encounter: Payer: Self-pay | Admitting: Internal Medicine

## 2014-01-21 ENCOUNTER — Ambulatory Visit (INDEPENDENT_AMBULATORY_CARE_PROVIDER_SITE_OTHER): Payer: No Typology Code available for payment source | Admitting: Internal Medicine

## 2014-01-21 VITALS — BP 149/91 | HR 85 | Temp 98.7°F | Ht 71.0 in | Wt 237.3 lb

## 2014-01-21 DIAGNOSIS — I10 Essential (primary) hypertension: Secondary | ICD-10-CM

## 2014-01-21 DIAGNOSIS — Z760 Encounter for issue of repeat prescription: Secondary | ICD-10-CM

## 2014-01-21 DIAGNOSIS — M109 Gout, unspecified: Secondary | ICD-10-CM

## 2014-01-21 MED ORDER — PREDNISONE 10 MG PO TABS: 10.0000 mg | ORAL_TABLET | Freq: Every day | ORAL | Status: DC

## 2014-01-21 MED ORDER — ALLOPURINOL 100 MG PO TABS: 100.0000 mg | ORAL_TABLET | Freq: Every day | ORAL | Status: DC

## 2014-01-21 MED ORDER — KETOROLAC TROMETHAMINE 30 MG/ML IJ SOLN: 30.0000 mg | Freq: Once | INTRAMUSCULAR | Status: DC

## 2014-01-21 MED ORDER — INDOMETHACIN 50 MG PO CAPS: 50.0000 mg | ORAL_CAPSULE | Freq: Three times a day (TID) | ORAL | Status: DC

## 2014-01-21 MED ORDER — OMEPRAZOLE 20 MG PO CPDR: 20.0000 mg | DELAYED_RELEASE_CAPSULE | Freq: Two times a day (BID) | ORAL | Status: DC

## 2014-01-21 MED ORDER — DILTIAZEM HCL ER BEADS 120 MG PO CP24: 120.0000 mg | ORAL_CAPSULE | Freq: Every day | ORAL | Status: DC

## 2014-01-21 MED ORDER — KETOROLAC TROMETHAMINE 60 MG/2ML IM SOLN
60.0000 mg | Freq: Once | INTRAMUSCULAR | Status: AC
  Administered 2014-01-21: 60 mg via INTRAMUSCULAR

## 2014-01-21 NOTE — Progress Notes (Signed)
   Subjective:    Patient ID: [name], male    DOB: 02-Jul-1961, 53 y.o.   MRN: [id number]  HPI [name] is a 52 yo man pmh as listed below presents for gout flare.  Pt states that over the past 3-4 days when he ran out of his indomethacin he began having what are typical gout symptoms for the patient. This includes pain in his hand and elbow. He works at a [employer] and cold temperatures always seem to be an associated trigger for the patient. He also has repetitive trauma to the areas which exacerbate and prolong his symptoms. He takes allopurinol and indomethacin to control his attacks but still seems to have at least 4-6/year per the patient report. He is unable to leave his job at this point.   Review of Systems  Constitutional: Negative for fever, chills, activity change and fatigue.  Respiratory: Negative for shortness of breath.   Cardiovascular: Negative for chest pain, palpitations and leg swelling.  Gastrointestinal: Negative for nausea, vomiting, abdominal pain and diarrhea.  Musculoskeletal: Positive for arthralgias (left elbow and right hand) and joint swelling (left elbow and right hand). Negative for myalgias.  Skin: Negative for color change, rash and wound.  Neurological: Negative for weakness and numbness.    Past Medical History  Diagnosis Date  . Allergy   . Hypertension   . Gout    Social, surgical, family history reviewed with patient and updated in appropriate chart locations.     Objective:   Physical Exam Filed Vitals:   01/21/14 1600  BP: 149/91  Pulse: 85  Temp: 98.7 F (37.1 C)   General: sitting in chair, uncomfortable, NAD HEENT: PERRL, EOMI, no scleral icterus Cardiac: RRR, no rubs, murmurs or gallops Pulm: clear to auscultation bilaterally, moving normal volumes of air Abd: soft, nontender, nondistended, BS present Ext: warm and well perfused, no pedal edema Msk: warm and edematous right hand MP and PIP joints along  with left elbow, pt still has FROM not limited by pain, no other warm joints or other effusions, hard to discern foci in setting of edema Neuro: alert and oriented X3, cranial nerves II-XII grossly intact    Assessment & Plan:  Please see problem oriented charting  Pt discussed with Dr. Lynnae January

## 2014-01-21 NOTE — Patient Instructions (Signed)
Take the steroids:  3 pills for 3 days, 2 pills for 3 days and 1 pill until finished  We will see you back once you are feeling better.

## 2014-01-23 NOTE — Assessment & Plan Note (Signed)
BP Readings from Last 3 Encounters:  01/21/14 149/91  10/01/13 166/87  08/31/13 140/88   Pt continues to have good control will continue current medications. Slight elevation most likely 2/2 ongoing pain from current gouty flare.  -medication refill provided

## 2014-01-23 NOTE — Assessment & Plan Note (Signed)
Per chart review pt has several attacks per year. This is hard to understand given pts most recent uric acid 5.6 on 5/14 on allopurinol and indomethacin. Hard to state whether pt has truly "failed" treatment given goal uric acid but pt continues to have multiple flares per year. His triggers seem to be cold and dietary which the patient can't change at this point in time. During his last evaluation an increase of his indomethacin was recommended but the patient didn't make this change.  -IM toradol in clinic -prednisone taper -indomethacin 50mg  TID prn pain -cont allopurinol -once pt receives insurance may need to have rheumatological referral for possible other agents or tips in flare management

## 2014-01-25 NOTE — Progress Notes (Signed)
Case discussed with Dr. Sadek soon after the resident saw the patient.  We reviewed the resident's history and exam and pertinent patient test results.  I agree with the assessment, diagnosis, and plan of care documented in the resident's note. 

## 2014-03-11 ENCOUNTER — Encounter: Payer: Self-pay | Admitting: Internal Medicine

## 2014-03-11 ENCOUNTER — Ambulatory Visit (INDEPENDENT_AMBULATORY_CARE_PROVIDER_SITE_OTHER): Payer: No Typology Code available for payment source | Admitting: Internal Medicine

## 2014-03-11 VITALS — BP 141/88 | HR 98 | Temp 97.7°F | Ht 71.0 in | Wt 239.2 lb

## 2014-03-11 DIAGNOSIS — M109 Gout, unspecified: Secondary | ICD-10-CM

## 2014-03-11 DIAGNOSIS — R7303 Prediabetes: Secondary | ICD-10-CM | POA: Insufficient documentation

## 2014-03-11 DIAGNOSIS — R7309 Other abnormal glucose: Secondary | ICD-10-CM

## 2014-03-11 LAB — GLUCOSE, CAPILLARY: Glucose-Capillary: 185 mg/dL — ABNORMAL HIGH (ref 70–99)

## 2014-03-11 LAB — POCT GLYCOSYLATED HEMOGLOBIN (HGB A1C): Hemoglobin A1C: 6

## 2014-03-11 MED ORDER — INDOMETHACIN 50 MG PO CAPS: 50.0000 mg | ORAL_CAPSULE | Freq: Three times a day (TID) | ORAL | Status: DC

## 2014-03-11 NOTE — Assessment & Plan Note (Signed)
Patient is doing much better and completed prednisone taper and is not in an acute flare. Patient does need refill on indomethacin. Patient continues on allopurinol

## 2014-03-11 NOTE — Assessment & Plan Note (Addendum)
This is a new diagnosis for the patient as his hemoglobin A1c was 6.0 and random CBG was 185. Patient does have very strong family history and is starting to exhibit some polyuria and polydipsia. Extensive education in terms of this diagnosis and information regarding lifestyle and diet changes were given to the patient. Patient's urinary frequency does not seem to be secondary to a UTI as urine dipstick was negative for nitrites and leukocytes.patient does not have any other systemic signs of concurrent infection. -A followup in 6 months -Recommended smoking cessation -Patient was offered to see diabetes educator and deferred at this time

## 2014-03-11 NOTE — Patient Instructions (Signed)
7 [employer] of Healthy Eating Here are seven sound diet principles that can keep your blood sugars from creeping upward, among other health benefits.  Skip the sugary drinks. No sweet tea. No juice. No soda. No sweetened lemonade. No mocha latte coffee creations. "My number one recommendation to people is: Don't drink your sugar," Borcik says. Sugary drinks provide nothing more than empty calories, and they won't help you feel full. "All the sugary drinks out there are a real risk factor for obesity," she stresses. Pull back on portions. You still can eat many of the foods you like, just have smaller amounts of them, Borcik says, adding that this is especially true for starchy foods like white rice, white potatoes, and pasta. Cut out high-calorie, junky snacks, and save your decadent desserts for special occasions. Remember that even healthy foods can lead to weight gain if you eat too much of them, and being overweight is a primary risk factor for type 2 diabetes. Fill up on fiber. Eat plenty of high-fiber foods, including vegetables, fruits, beans, and whole grains. Fiber helps you feel fuller longer and can help you eat less to avoid weight gain. At least half your plate should be fruits and vegetables that have been steamed or sauted in healthy fats. Those veggies can be fresh, frozen, or canned. Just skip the canned vegetables with added salt. Be choosy about fats. Your diet should have some fat, but opt for the healthiest sources: olive and vegetable oils, nuts, seeds, and avocado. Buy low-fat or fat-free dairy products such as reduced-fat cheeses, non-fat or low-fat yogurt, and skim milk. Drink alcohol only in moderation. Men should have no more than two drinks a day, women no more than one. A drink is 12 ounces of beer, 5 ounces of wine, or 1.5 ounces of distilled spirits of 80-proof. Choose lean meats. To easily identify lean red meats, look for cuts that have the word "round" or "loin" in their  name, such as top round or pork loin. Even with these better-for-you picks, trim all visible fat. Opt for white-meat chicken or [place] without the skin. Adding fish to your diet two to three times a week is part of a diet that can help diabetes prevention. Bake, broil, roast, grill, or saut rather than fry to keep it lean, Borcik says. Stay hydrated. Drink plenty of water. People often mistake thirst for hunger, which can lead to overeating and weight gain. "You feel better when you're hydrated," Borcik says. "Staying hydrated helps to lower your blood sugar, which you should do anyway." Diet and Exercise Go Hand-in-Hand A prediabetes regimen also includes regular exercise, both aerobics and strength training, to help you get to a healthy weight and maintain it once you're there, says Borcik. Plan for at least 30 minutes a day.

## 2014-03-11 NOTE — Progress Notes (Signed)
   Subjective:    Patient ID: [name], male    DOB: 08-08-1961, 53 y.o.   MRN: [id number]  HPI [name] is a 53 yo man pmh as listed below presents for increased urinary frequency.  Patient does not have any fevers or chills, hematuria, new sexual partners, abdominal pain, suprapubic pain, penile discharge, low back pain, or foul-smelling urine. The patient describes that he's been more thirsty and also been peeing more frequently but not necessarily having frank urgency.   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  He continues to smoke and drink EtOH daily (beers/hard liquor). He has extensive family hx of diabetes with a younger sister and mother.   Review of Systems  Constitutional: Negative for fever, diaphoresis, appetite change, fatigue and unexpected weight change.  Cardiovascular: Negative for chest pain, palpitations and leg swelling.  Gastrointestinal: Negative for nausea, vomiting, abdominal pain, diarrhea, constipation, blood in stool and abdominal distention.  Endocrine: Positive for polydipsia and polyuria.  Genitourinary: Positive for frequency. Negative for dysuria, urgency, hematuria, flank pain, decreased urine volume, discharge, penile swelling, scrotal swelling, difficulty urinating, penile pain and testicular pain.  Musculoskeletal: Negative for back pain.  Skin: Negative for rash.  Neurological: Negative for dizziness, syncope, weakness and headaches.    Past Medical History  Diagnosis Date  . Allergy   . Hypertension   . Gout    Social, surgical, family history reviewed with patient and updated in appropriate chart locations.     Objective:   Physical Exam Filed Vitals:   03/11/14 1351  BP: 141/88  Pulse: 98  Temp: 97.7 F (36.5 C)   General: sitting in chair,  NAD HEENT: no scleral icterus Cardiac: RRR, no rubs, murmurs or gallops Pulm: clear to auscultation bilaterally, moving normal volumes of air Abd: soft, nontender, nondistended, BS present Ext: warm and well perfused, no pedal edema Neuro: alert and oriented X3, cranial nerves II-XII grossly intact    Assessment & Plan:  Please see problem oriented charting  Pt discussed with Dr. Ellwood Dense

## 2014-03-18 NOTE — Progress Notes (Signed)
Case discussed with Dr. Sadek at the time of the visit.  We reviewed the resident's history and exam and pertinent patient test results.  I agree with the assessment, diagnosis, and plan of care documented in the resident's note. 

## 2014-04-20 ENCOUNTER — Telehealth: Payer: Self-pay | Admitting: *Deleted

## 2014-04-20 ENCOUNTER — Encounter: Payer: Self-pay | Admitting: Internal Medicine

## 2014-04-20 ENCOUNTER — Ambulatory Visit (INDEPENDENT_AMBULATORY_CARE_PROVIDER_SITE_OTHER): Payer: No Typology Code available for payment source | Admitting: Internal Medicine

## 2014-04-20 VITALS — BP 148/96 | HR 78 | Temp 97.5°F | Ht 71.0 in | Wt 239.6 lb

## 2014-04-20 DIAGNOSIS — I10 Essential (primary) hypertension: Secondary | ICD-10-CM

## 2014-04-20 DIAGNOSIS — M109 Gout, unspecified: Secondary | ICD-10-CM

## 2014-04-20 MED ORDER — PREDNISONE (PAK) 10 MG PO TABS
ORAL_TABLET | Freq: Every day | ORAL | Status: DC
Start: 2014-04-20 — End: 2014-05-25

## 2014-04-20 NOTE — Progress Notes (Signed)
Subjective:   Patient ID: [name] male   DOB: 03-14-1961 53 y.o.   MRN: [id number]  HPI: [name] is a 53 y.o. with PMH significant for Gout with history multiple gouty flares despite on therapy with Allopurinol comes to the clinic with CC of Left elbow pain and swelling for the last 3 days.  Patient reports that his symptoms started about 3 days ago and gradually worsened. He states taking Indomethacin 50 mg TID without any relief and he reports compliance to his allopurinol. He denies any fever, chills, body pains. He states that he movements at the left elbow are limited due to pain.  He denies any other complaints.   Past Medical History  Diagnosis Date  . Allergy   . Hypertension   . Gout    Current Outpatient Prescriptions  Medication Sig Dispense Refill  . albuterol (PROVENTIL HFA;VENTOLIN HFA) 108 (90 BASE) MCG/ACT inhaler Inhale 2 puffs into the lungs every 6 (six) hours as needed for wheezing.  1 Inhaler  0  . allopurinol (ZYLOPRIM) 100 MG tablet Take 1 tablet (100 mg total) by mouth daily.  30 tablet  0  . benzonatate (TESSALON) 200 MG capsule Take 1 capsule (200 mg total) by mouth 3 (three) times daily as needed for cough.  20 capsule  0  . diltiazem (TIAZAC) 120 MG 24 hr capsule Take 1 capsule (120 mg total) by mouth daily.  30 capsule  5  . indomethacin (INDOCIN) 50 MG capsule Take 1 capsule (50 mg total) by mouth 3 (three) times daily with meals.  30 capsule  0  . loratadine (CLARITIN) 10 MG tablet Take 1 tablet (10 mg total) by mouth daily.  30 tablet  3  . omeprazole (PRILOSEC) 20 MG capsule Take 1 capsule (20 mg total) by mouth 2 (two) times daily.  60 capsule  1  . predniSONE (STERAPRED UNI-PAK) 10 MG tablet Take by mouth daily. Use as directed  21 tablet  0   No current facility-administered medications for this visit.   Family History  Problem Relation Age of Onset  . Heart disease Mother   . Diabetes Mother   . Liver disease Father   .  Diabetes Sister   . Colon polyps Neg Hx   . Colon cancer Neg Hx   . Esophageal cancer Neg Hx   . Stomach cancer Neg Hx    History   Social History  . Marital Status: Married    Spouse Name: N/A    Number of Children: N/A  . Years of Education: N/A   Social History Main Topics  . Smoking status: Current Every Day Smoker -- 0.40 packs/day for 32 years    Types: Cigarettes  . Smokeless tobacco: Former Systems developer     Comment: PATIENT STATES HE HAS CUT BACK SOME  . Alcohol Use: 21.6 oz/week    36 Cans of beer per week  . Drug Use: No     Comment: Used cocaine about 15 years before.  Marland Kitchen Sexual Activity: None   Other Topics Concern  . None   Social History Narrative   Patient lives in [place] with his wife and 3 daughters.   He has have a total of 6 daughters.   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   Review of Systems: Pertinent items are noted in HPI. Objective:  Physical Exam: Filed Vitals:   04/20/14 1516  BP: 148/96  Pulse: 78  Temp: 97.5 F (36.4 C)  TempSrc: Oral  Height: 5\' 11"  (1.803 m)  Weight: 239 lb 9.6 oz (108.682 kg)  SpO2: 95%   Constitutional: Vital signs reviewed.  Patient is a well-developed and well-nourished and is in no acute distress and cooperative with exam. Alert and oriented x3.  Cardiovascular: RRR, S1 normal, S2 normal, no MRG, pulses symmetric and intact bilaterally Pulmonary/Chest: normal respiratory effort, CTAB, no wheezes, rales, or rhonchi Musculoskeletal: Left elbow is swollen posteriorly slightly above the olecranon fossa. The skin over the swelling is erythematous. The posterior aspect of the elbow joint feels warm to touch. There is tenderness to palpation over the posterior aspect of the elbow joint and there is no bony tenderness over the olecranon process. ROM slightly restricted secondary to the  Pain. No other swollen joints noted in the body.  Skin: Warm, dry and intact. No rash, cyanosis, or  clubbing.    Assessment & Plan:

## 2014-04-20 NOTE — Patient Instructions (Addendum)
Take Prednisone as instructed. Take all the other medications as advised. If your pain worsens or persists after a week, please give [place] a call or seek medical help. Decrease beer intake as it causes gout flares.

## 2014-04-20 NOTE — Progress Notes (Signed)
Attending note: I reviewed presenting complaints, problems, physical exam, and medications with resident physician Vijaya Boggala  Murriel Hopper, MD, [place]  Hematology-Oncology/Internal Medicine

## 2014-04-20 NOTE — Telephone Encounter (Signed)
Pt called - work has inc working hours  - having a flare up of gout in  left elbow past 2 days. Still on gout med. Appt today 2:45PM Dr Eyvonne Mechanic. Pt aware. Hilda Blades Anayi Bricco RN 04/20/14 12N

## 2014-04-20 NOTE — Assessment & Plan Note (Signed)
Slightly elevated in the setting of Acute gouty attack. Plans: Continue current meds.

## 2014-04-20 NOTE — Assessment & Plan Note (Signed)
Clinical symptoms and signs consistent with another episode of gout flare up. Flare up in the setting of chronic allopurinol therapy and ongoing heavy alcohol intake. Symptoms not responding after 2-3 days of Indomethacin 50 mg TID Discussed with Dr. Beryle Beams regarding further management.  Plans: Short course of Prednisone taper. Continue allopurinol. Counseled him regarding cutting down on alcohol and patient doesn't seem to be interested at this point.

## 2014-05-02 ENCOUNTER — Other Ambulatory Visit: Payer: Self-pay | Admitting: *Deleted

## 2014-05-02 DIAGNOSIS — M109 Gout, unspecified: Secondary | ICD-10-CM

## 2014-05-03 MED ORDER — ALLOPURINOL 100 MG PO TABS: 100.0000 mg | ORAL_TABLET | Freq: Every day | ORAL | Status: DC

## 2014-05-03 NOTE — Telephone Encounter (Signed)
Called to pharm 

## 2014-05-25 ENCOUNTER — Emergency Department (HOSPITAL_COMMUNITY): Payer: No Typology Code available for payment source

## 2014-05-25 ENCOUNTER — Encounter (HOSPITAL_COMMUNITY): Payer: Self-pay | Admitting: Emergency Medicine

## 2014-05-25 ENCOUNTER — Telehealth: Payer: Self-pay | Admitting: *Deleted

## 2014-05-25 ENCOUNTER — Inpatient Hospital Stay (HOSPITAL_COMMUNITY)
Admission: EM | Admit: 2014-05-25 | Discharge: 2014-05-29 | DRG: 638 | Disposition: A | Discharge status: Home / Self Care | Payer: No Typology Code available for payment source | Attending: Oncology | Admitting: Oncology

## 2014-05-25 DIAGNOSIS — R7309 Other abnormal glucose: Secondary | ICD-10-CM

## 2014-05-25 DIAGNOSIS — F17200 Nicotine dependence, unspecified, uncomplicated: Secondary | ICD-10-CM | POA: Diagnosis present

## 2014-05-25 DIAGNOSIS — I10 Essential (primary) hypertension: Secondary | ICD-10-CM

## 2014-05-25 DIAGNOSIS — E871 Hypo-osmolality and hyponatremia: Secondary | ICD-10-CM | POA: Diagnosis present

## 2014-05-25 DIAGNOSIS — Z833 Family history of diabetes mellitus: Secondary | ICD-10-CM

## 2014-05-25 DIAGNOSIS — E872 Acidosis, unspecified: Secondary | ICD-10-CM | POA: Diagnosis present

## 2014-05-25 DIAGNOSIS — E119 Type 2 diabetes mellitus without complications: Secondary | ICD-10-CM

## 2014-05-25 DIAGNOSIS — E875 Hyperkalemia: Secondary | ICD-10-CM | POA: Diagnosis present

## 2014-05-25 DIAGNOSIS — R1310 Dysphagia, unspecified: Secondary | ICD-10-CM | POA: Diagnosis present

## 2014-05-25 DIAGNOSIS — Z8249 Family history of ischemic heart disease and other diseases of the circulatory system: Secondary | ICD-10-CM

## 2014-05-25 DIAGNOSIS — E1129 Type 2 diabetes mellitus with other diabetic kidney complication: Secondary | ICD-10-CM | POA: Insufficient documentation

## 2014-05-25 DIAGNOSIS — E1100 Type 2 diabetes mellitus with hyperosmolarity without nonketotic hyperglycemic-hyperosmolar coma (NKHHC): Principal | ICD-10-CM

## 2014-05-25 DIAGNOSIS — K0889 Other specified disorders of teeth and supporting structures: Secondary | ICD-10-CM | POA: Diagnosis present

## 2014-05-25 DIAGNOSIS — R809 Proteinuria, unspecified: Secondary | ICD-10-CM

## 2014-05-25 DIAGNOSIS — I7389 Other specified peripheral vascular diseases: Secondary | ICD-10-CM | POA: Diagnosis present

## 2014-05-25 DIAGNOSIS — B3781 Candidal esophagitis: Secondary | ICD-10-CM

## 2014-05-25 DIAGNOSIS — M109 Gout, unspecified: Secondary | ICD-10-CM | POA: Diagnosis present

## 2014-05-25 DIAGNOSIS — B370 Candidal stomatitis: Secondary | ICD-10-CM | POA: Diagnosis present

## 2014-05-25 DIAGNOSIS — K1370 Unspecified lesions of oral mucosa: Secondary | ICD-10-CM | POA: Diagnosis present

## 2014-05-25 DIAGNOSIS — E870 Hyperosmolality and hypernatremia: Secondary | ICD-10-CM

## 2014-05-25 LAB — CBC
HEMATOCRIT: 44.3 % (ref 39.0–52.0)
HEMOGLOBIN: 16 g/dL (ref 13.0–17.0)
MCH: 32.2 pg (ref 26.0–34.0)
MCHC: 36.1 g/dL — ABNORMAL HIGH (ref 30.0–36.0)
MCV: 89.1 fL (ref 78.0–100.0)
Platelets: 361 10*3/uL (ref 150–400)
RBC: 4.97 MIL/uL (ref 4.22–5.81)
RDW: 13 % (ref 11.5–15.5)
WBC: 12.5 10*3/uL — ABNORMAL HIGH (ref 4.0–10.5)

## 2014-05-25 LAB — CBC WITH DIFFERENTIAL/PLATELET
Basophils Absolute: 0 10*3/uL (ref 0.0–0.1)
Basophils Relative: 0 % (ref 0–1)
EOS ABS: 0 10*3/uL (ref 0.0–0.7)
EOS PCT: 0 % (ref 0–5)
HEMATOCRIT: 48 % (ref 39.0–52.0)
HEMOGLOBIN: 16.5 g/dL (ref 13.0–17.0)
LYMPHS ABS: 1.7 10*3/uL (ref 0.7–4.0)
Lymphocytes Relative: 19 % (ref 12–46)
MCH: 32.5 pg (ref 26.0–34.0)
MCHC: 34.4 g/dL (ref 30.0–36.0)
MCV: 94.5 fL (ref 78.0–100.0)
MONOS PCT: 8 % (ref 3–12)
Monocytes Absolute: 0.7 10*3/uL (ref 0.1–1.0)
Neutro Abs: 6.7 10*3/uL (ref 1.7–7.7)
Neutrophils Relative %: 73 % (ref 43–77)
Platelets: 380 10*3/uL (ref 150–400)
RBC: 5.08 MIL/uL (ref 4.22–5.81)
RDW: 13.6 % (ref 11.5–15.5)
WBC: 9.1 10*3/uL (ref 4.0–10.5)

## 2014-05-25 LAB — BASIC METABOLIC PANEL
BUN: 20 mg/dL (ref 6–23)
BUN: 18 mg/dL (ref 6–23)
BUN: 17 mg/dL (ref 6–23)
CHLORIDE: 98 meq/L (ref 96–112)
CHLORIDE: 104 meq/L (ref 96–112)
CO2: 26 mEq/L (ref 19–32)
CO2: 29 meq/L (ref 19–32)
CO2: 27 mEq/L (ref 19–32)
Calcium: 10.3 mg/dL (ref 8.4–10.5)
Calcium: 10.7 mg/dL — ABNORMAL HIGH (ref 8.4–10.5)
Calcium: 10.5 mg/dL (ref 8.4–10.5)
Chloride: 103 mEq/L (ref 96–112)
Creatinine, Ser: 0.89 mg/dL (ref 0.50–1.35)
Creatinine, Ser: 0.9 mg/dL (ref 0.50–1.35)
Creatinine, Ser: 0.93 mg/dL (ref 0.50–1.35)
GFR calc Af Amer: >90 mL/min (ref >90)
GFR calc Af Amer: >90 mL/min (ref >90)
GFR calc Af Amer: >90 mL/min (ref >90)
GFR calc non Af Amer: >90 mL/min (ref >90)
GFR calc non Af Amer: >90 mL/min (ref >90)
GFR calc non Af Amer: >90 mL/min (ref >90)
GLUCOSE: 359 mg/dL — AB (ref 70–99)
Glucose, Bld: 210 mg/dL — ABNORMAL HIGH (ref 70–99)
Glucose, Bld: 176 mg/dL — ABNORMAL HIGH (ref 70–99)
POTASSIUM: 3.6 meq/L — AB (ref 3.7–5.3)
POTASSIUM: 3.6 meq/L — AB (ref 3.7–5.3)
Potassium: 3.8 mEq/L (ref 3.7–5.3)
SODIUM: 146 meq/L (ref 137–147)
Sodium: 141 mEq/L (ref 137–147)
Sodium: 144 mEq/L (ref 137–147)

## 2014-05-25 LAB — COMPREHENSIVE METABOLIC PANEL
ALK PHOS: 248 U/L — AB (ref 39–117)
ALT: 27 U/L (ref 0–53)
AST: 19 U/L (ref 0–37)
Albumin: 4.6 g/dL (ref 3.5–5.2)
BUN: 26 mg/dL — ABNORMAL HIGH (ref 6–23)
CALCIUM: 10.9 mg/dL — AB (ref 8.4–10.5)
CO2: 23 meq/L (ref 19–32)
Chloride: 82 mEq/L — ABNORMAL LOW (ref 96–112)
Creatinine, Ser: 1.18 mg/dL (ref 0.50–1.35)
GFR, EST AFRICAN AMERICAN: 80 mL/min — AB (ref >90)
GFR, EST NON AFRICAN AMERICAN: 69 mL/min — AB (ref >90)
GLUCOSE: 1321 mg/dL — AB (ref 70–99)
POTASSIUM: 5.4 meq/L — AB (ref 3.7–5.3)
Sodium: 128 mEq/L — ABNORMAL LOW (ref 137–147)
Total Bilirubin: 0.5 mg/dL (ref 0.3–1.2)
Total Protein: 9.6 g/dL — ABNORMAL HIGH (ref 6.0–8.3)

## 2014-05-25 LAB — URINALYSIS, ROUTINE W REFLEX MICROSCOPIC
Bilirubin Urine: NEGATIVE
KETONES UR: 15 mg/dL — AB
Leukocytes, UA: NEGATIVE
NITRITE: NEGATIVE
Protein, ur: 30 mg/dL — AB
Specific Gravity, Urine: 1.036 — ABNORMAL HIGH (ref 1.005–1.030)
Urobilinogen, UA: 0.2 mg/dL (ref 0.0–1.0)
pH: 6 (ref 5.0–8.0)

## 2014-05-25 LAB — I-STAT ARTERIAL BLOOD GAS, ED
Acid-base deficit: 2 mmol/L (ref 0.0–2.0)
BICARBONATE: 24 meq/L (ref 20.0–24.0)
O2 Saturation: 93 %
PCO2 ART: 42.9 mmHg (ref 35.0–45.0)
PH ART: 7.355 (ref 7.350–7.450)
PO2 ART: 69 mmHg — AB (ref 80.0–100.0)
TCO2: 25 mmol/L (ref 0–100)

## 2014-05-25 LAB — CBG MONITORING, ED
GLUCOSE-CAPILLARY: 368 mg/dL — AB (ref 70–99)
Glucose-Capillary: >600 mg/dL (ref 70–99)
Glucose-Capillary: 598 mg/dL (ref 70–99)
Glucose-Capillary: 442 mg/dL — ABNORMAL HIGH (ref 70–99)
Glucose-Capillary: 479 mg/dL — ABNORMAL HIGH (ref 70–99)
Glucose-Capillary: >600 mg/dL (ref 70–99)
Glucose-Capillary: 300 mg/dL — ABNORMAL HIGH (ref 70–99)
Glucose-Capillary: 272 mg/dL — ABNORMAL HIGH (ref 70–99)

## 2014-05-25 LAB — URINE MICROSCOPIC-ADD ON

## 2014-05-25 LAB — MRSA PCR SCREENING: MRSA by PCR: NEGATIVE

## 2014-05-25 LAB — I-STAT TROPONIN, ED: Troponin i, poc: 0 ng/mL (ref 0.00–0.08)

## 2014-05-25 MED ORDER — HEPARIN SODIUM (PORCINE) 5000 UNIT/ML IJ SOLN
5000.0000 [IU] | Freq: Three times a day (TID) | INTRAMUSCULAR | Status: DC
  Administered 2014-05-25 – 2014-05-28 (×10): 5000 [IU] via SUBCUTANEOUS
  Filled 2014-05-25 (×14): qty 1

## 2014-05-25 MED ORDER — SODIUM CHLORIDE 0.9 % IV SOLN
INTRAVENOUS | Status: AC
  Administered 2014-05-25: 18:00:00 via INTRAVENOUS

## 2014-05-25 MED ORDER — DEXTROSE-NACL 5-0.45 % IV SOLN
INTRAVENOUS | Status: DC
  Administered 2014-05-25: 100 mL/h via INTRAVENOUS

## 2014-05-25 MED ORDER — POTASSIUM CHLORIDE 10 MEQ/100ML IV SOLN: 10.0000 meq | INTRAVENOUS | Status: DC

## 2014-05-25 MED ORDER — SODIUM CHLORIDE 0.9 % IV BOLUS (SEPSIS)
2000.0000 mL | Freq: Once | INTRAVENOUS | Status: AC
  Administered 2014-05-25: 2000 mL via INTRAVENOUS

## 2014-05-25 MED ORDER — ALLOPURINOL 100 MG PO TABS
100.0000 mg | ORAL_TABLET | Freq: Every day | ORAL | Status: DC
  Administered 2014-05-25 – 2014-05-29 (×5): 100 mg via ORAL
  Filled 2014-05-25 (×5): qty 1

## 2014-05-25 MED ORDER — DEXTROSE-NACL 5-0.45 % IV SOLN: INTRAVENOUS | Status: DC

## 2014-05-25 MED ORDER — SODIUM CHLORIDE 0.9 % IV SOLN
INTRAVENOUS | Status: DC
  Administered 2014-05-25: 21:00:00 via INTRAVENOUS
  Filled 2014-05-25: qty 1

## 2014-05-25 MED ORDER — SODIUM CHLORIDE 0.9 % IV SOLN
INTRAVENOUS | Status: DC
  Administered 2014-05-25: 18:00:00 via INTRAVENOUS

## 2014-05-25 MED ORDER — INSULIN REGULAR HUMAN 100 UNIT/ML IJ SOLN
INTRAMUSCULAR | Status: DC
  Administered 2014-05-25: 0.9 [IU]/h via INTRAVENOUS
  Administered 2014-05-25: 5.4 [IU]/h via INTRAVENOUS
  Filled 2014-05-25 (×2): qty 1

## 2014-05-25 MED ORDER — DEXTROSE 50 % IV SOLN: 25.0000 mL | INTRAVENOUS | Status: DC | PRN

## 2014-05-25 NOTE — ED Notes (Signed)
CBG exceeds 600

## 2014-05-25 NOTE — ED Notes (Signed)
Glucose 1321 critical from lab.

## 2014-05-25 NOTE — ED Notes (Signed)
Pt states difficulty swallowing is not related to inability to swallow related to pain. Pt controlling secretions without remark. Speech clear.

## 2014-05-25 NOTE — Telephone Encounter (Signed)
I agree with RN Helen's recommendations. Patient will need evaluation for a possible CVA and should go to the ED ASAP. Thank you

## 2014-05-25 NOTE — ED Provider Notes (Signed)
CSN: [id number]     Arrival date & time 05/25/14  8242 History   First MD Initiated Contact with Patient 05/25/14 1103     Chief Complaint  Patient presents with  . Dysphagia  . Gout  . Weakness     (Consider location/radiation/quality/duration/timing/severity/associated sxs/prior Treatment) The history is provided by the patient.  Jassiel Flye is a 53 y.o. male hx of HTN, gout here with weakness, weight loss. Starting Friday last week (6 days ago) he started feeling weak. He also had right knee pain and felt that he had a gout flare so took some pain medicine. For the last few days, he has been more thirsty and urinating frequently. His family also noted that he has poor energy and diffuse weakness (not true lethargy). Moreover, his daughter also noted that he has lost some weight. No history of diabetes.   Past Medical History  Diagnosis Date  . Allergy   . Hypertension   . Gout    Past Surgical History  Procedure Laterality Date  . Ankle arthroscopy w/ open repair  2008    both ankles   Family History  Problem Relation Age of Onset  . Heart disease Mother   . Diabetes Mother   . Liver disease Father   . Diabetes Sister   . Colon polyps Neg Hx   . Colon cancer Neg Hx   . Esophageal cancer Neg Hx   . Stomach cancer Neg Hx    History  Substance Use Topics  . Smoking status: Current Every Day Smoker -- 0.40 packs/day for 32 years    Types: Cigarettes  . Smokeless tobacco: Former Systems developer     Comment: PATIENT STATES HE HAS CUT BACK SOME  . Alcohol Use: 21.6 oz/week    36 Cans of beer per week    Review of Systems  Musculoskeletal:       R knee pain   Neurological: Positive for dizziness and weakness.  All other systems reviewed and are negative.     Allergies  Review of patient's allergies indicates no known allergies.  Home Medications   Prior to Admission medications   Medication Sig Start Date End Date Taking? Authorizing Provider  albuterol (PROVENTIL  HFA;VENTOLIN HFA) 108 (90 BASE) MCG/ACT inhaler Inhale 2 puffs into the lungs every 6 (six) hours as needed for wheezing. 10/01/13  Yes Clinton Gallant, MD  allopurinol (ZYLOPRIM) 100 MG tablet Take 1 tablet (100 mg total) by mouth daily.   Yes Clinton Gallant, MD  diltiazem [facility]) 120 MG 24 hr capsule Take 1 capsule (120 mg total) by mouth daily. 01/21/14  Yes Clinton Gallant, MD  indomethacin (INDOCIN) 50 MG capsule Take 1 capsule (50 mg total) by mouth 3 (three) times daily with meals. 03/11/14  Yes Clinton Gallant, MD  loratadine (CLARITIN) 10 MG tablet Take 1 tablet (10 mg total) by mouth daily. 08/31/13  Yes Joni Reining, DO  Menthol, Topical Analgesic, (BIOFREEZE EX) Apply 1 application topically 2 (two) times daily as needed (pain).   Yes Historical Provider, MD   BP 124/91  Pulse 86  Temp(Src) 97 F (36.1 C) (Axillary)  Resp 14  Ht 5\' 11"  (1.803 m)  Wt 201 lb (91.173 kg)  BMI 28.05 kg/m2  SpO2 93% Physical Exam  Nursing note and vitals reviewed. Constitutional: He is oriented to person, place, and time. He appears well-developed and well-nourished.  HENT:  Head: Normocephalic.  MM dry   Eyes: Conjunctivae and EOM are normal. Pupils are equal, round,  and reactive to light.  Neck: Normal range of motion. Neck supple.  Cardiovascular: Normal rate, regular rhythm and normal heart sounds.   Pulmonary/Chest: Effort normal and breath sounds normal. No respiratory distress. He has no wheezes. He has no rales.  Abdominal: Soft. Bowel sounds are normal. He exhibits no distension. There is no tenderness. There is no rebound and no guarding.  Musculoskeletal: Normal range of motion. He exhibits no edema and no tenderness.  Neurological: He is alert and oriented to person, place, and time.  4/5 strength throughout   Skin: Skin is warm and dry.  Psychiatric: He has a normal mood and affect. His behavior is normal. Judgment and thought content normal.    ED Course  Procedures (including critical care  time)  CRITICAL CARE Performed by: Wandra Arthurs   Total critical care time: 30 min   Critical care time was exclusive of separately billable procedures and treating other patients.  Critical care was necessary to treat or prevent imminent or life-threatening deterioration.  Critical care was time spent personally by me on the following activities: development of treatment plan with patient and/or surrogate as well as nursing, discussions with consultants, evaluation of patient's response to treatment, examination of patient, obtaining history from patient or surrogate, ordering and performing treatments and interventions, ordering and review of laboratory studies, ordering and review of radiographic studies, pulse oximetry and re-evaluation of patient's condition.   Labs Review Labs Reviewed  COMPREHENSIVE METABOLIC PANEL - Abnormal; Notable for the following:    Sodium 128 (*)    Potassium 5.4 (*)    Chloride 82 (*)    Glucose, Bld 1321 (*)    BUN 26 (*)    Calcium 10.9 (*)    Total Protein 9.6 (*)    Alkaline Phosphatase 248 (*)    GFR calc non Af Amer 69 (*)    GFR calc Af Amer 80 (*)    All other components within normal limits  I-STAT ARTERIAL BLOOD GAS, ED - Abnormal; Notable for the following:    pO2, Arterial 69.0 (*)    All other components within normal limits  CBC WITH DIFFERENTIAL  URINALYSIS, ROUTINE W REFLEX MICROSCOPIC  BLOOD GAS, ARTERIAL  I-STAT TROPOININ, ED    Imaging Review Dg Chest Portable 1 View  05/25/2014   CLINICAL DATA:  Chest tightness.  EXAM: PORTABLE CHEST - 1 VIEW  COMPARISON:  01/04/2008  FINDINGS: The heart size and mediastinal contours are within normal limits. Both lungs are clear. The visualized skeletal structures are unremarkable.  IMPRESSION: No active disease.   Electronically Signed   By: Lajean Manes M.D.   On: 05/25/2014 11:40     EKG Interpretation None      MDM   Final diagnoses:  None    Wilson Dusenbery is a 53 y.o.  male here with weakness. Glucose was 1321 on CMP. AG 23. But PH nl. I think likely new onset diabetes with dehydration and hyperosmolar. Given 2 L NS and insulin drip started. Will admit to stepdown.      Wandra Arthurs, MD 05/25/14 [phone]

## 2014-05-25 NOTE — H&P (Signed)
Date: 05/25/2014               Patient Name:  [name] MRN: [id number]  DOB: September 28, 1961 Age / Sex: 53 y.o., male   PCP: Clinton Gallant, MD         Medical Service: Internal Medicine Teaching Service         Attending Physician: Dr. Annia Belt, MD    First Contact: Dr. Duwaine Maxin Pager: [phone]  Second Contact: Dr. Clinton Gallant Pager: [phone]       After Hours (After 5p/  First Contact Pager: [phone]  weekends / holidays): Second Contact Pager: [phone]   Chief Complaint: Weakness and excessive thirst  History of Present Illness: [name] is a 53 y.o. man who presents with a cc of progressive weakness. Over the last few weeks the patient has been feeling poorly. He has noticed increased thirst and urine output. He has also lost 30 pounds over the last few months. It has progressed to the point where he cannot perform his job.  He denies somnolence, tremors. He admits to N/V over the last 24-48 hours. He also complains of vague abdominal discomfort. BMP revealed a glucose of 1300. The patient was admitted for management of his hyperglycemia.  Meds: Current Facility-Administered Medications  Medication Dose Route Frequency Provider Last Rate Last Dose  . dextrose 5 %-0.45 % sodium chloride infusion   Intravenous Continuous Wandra Arthurs, MD      . insulin regular (NOVOLIN R,HUMULIN R) 1 Units/mL in sodium chloride 0.9 % 100 mL infusion   Intravenous Continuous Wandra Arthurs, MD 11.5 mL/hr at 05/25/14 1623 11.5 Units/hr at 05/25/14 1623   Current Outpatient Prescriptions  Medication Sig Dispense Refill  . albuterol (PROVENTIL HFA;VENTOLIN HFA) 108 (90 BASE) MCG/ACT inhaler Inhale 2 puffs into the lungs every 6 (six) hours as needed for wheezing.  1 Inhaler  0  . allopurinol (ZYLOPRIM) 100 MG tablet Take 1 tablet (100 mg total) by mouth daily.  30 tablet  5  . diltiazem (TIAZAC) 120 MG 24 hr capsule Take 1 capsule (120 mg total) by mouth daily.  30 capsule  5  . indomethacin  (INDOCIN) 50 MG capsule Take 1 capsule (50 mg total) by mouth 3 (three) times daily with meals.  30 capsule  0  . loratadine (CLARITIN) 10 MG tablet Take 1 tablet (10 mg total) by mouth daily.  30 tablet  3  . Menthol, Topical Analgesic, (BIOFREEZE EX) Apply 1 application topically 2 (two) times daily as needed (pain).        Allergies: Allergies as of 05/25/2014  . (No Known Allergies)   Past Medical History  Diagnosis Date  . Allergy   . Hypertension   . Gout    Past Surgical History  Procedure Laterality Date  . Ankle arthroscopy w/ open repair  2008    both ankles   Family History  Problem Relation Age of Onset  . Heart disease Mother   . Diabetes Mother   . Liver disease Father   . Diabetes Sister   . Colon polyps Neg Hx   . Colon cancer Neg Hx   . Esophageal cancer Neg Hx   . Stomach cancer Neg Hx    History   Social History  . Marital Status: Married    Spouse Name: N/A    Number of Children: N/A  . Years of Education: N/A   Occupational History  . Not on file.   Social History  Main Topics  . Smoking status: Current Every Day Smoker -- 0.40 packs/day for 32 years    Types: Cigarettes  . Smokeless tobacco: Former Systems developer     Comment: PATIENT STATES HE HAS CUT BACK SOME  . Alcohol Use: 21.6 oz/week    36 Cans of beer per week  . Drug Use: No     Comment: Used cocaine about 15 years before.  Marland Kitchen Sexual Activity: Not on file   Other Topics Concern  . Not on file   Social History Narrative   Patient lives in [place] with his wife and 3 daughters.   He has have a total of 6 daughters.   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    Review of Systems: Review of Systems  Constitutional: Positive for weight loss and malaise/fatigue. Negative for fever, chills and diaphoresis.  Eyes: Positive for blurred vision. Negative for double vision, photophobia and pain.  Respiratory: Negative for cough, sputum production and  shortness of breath.   Cardiovascular: Negative for chest pain, palpitations and orthopnea.  Gastrointestinal: Positive for nausea, vomiting and abdominal pain. Negative for heartburn, diarrhea, constipation, blood in stool and melena.  Genitourinary: Positive for frequency. Negative for dysuria.  Musculoskeletal: Negative for back pain, myalgias and neck pain.  Neurological: Positive for weakness. Negative for headaches.     Physical Exam: Blood pressure 135/91, pulse 76, temperature 97 F (36.1 C), temperature source Axillary, resp. rate 16, height 5\' 11"  (1.803 m), weight 201 lb (91.173 kg), SpO2 93.00%. Physical Exam  Constitutional: He is oriented to person, place, and time. He appears well-developed and well-nourished.  HENT:  Head: Normocephalic and atraumatic.  Dry mucous membranes  Cardiovascular: Normal rate, regular rhythm, normal heart sounds and intact distal pulses.  Exam reveals no friction rub.   No murmur heard. Pulmonary/Chest: Effort normal and breath sounds normal. No respiratory distress. He has no wheezes. He has no rales.  Abdominal: Soft. Bowel sounds are normal. He exhibits no distension. There is tenderness. There is no rebound and no guarding.  Mild abdominal tenderness.  Neurological: He is alert and oriented to person, place, and time. No cranial nerve deficit. Coordination normal.  Skin: He is not diaphoretic.  Psychiatric: He has a normal mood and affect. His behavior is normal.     Lab results: Basic Metabolic Panel:  Recent Labs  05/25/14 0955  NA 128*  K 5.4*  CL 82*  CO2 23  GLUCOSE 1321*  BUN 26*  CREATININE 1.18  CALCIUM 10.9*   Liver Function Tests:  Recent Labs  05/25/14 0955  AST 19  ALT 27  ALKPHOS 248*  BILITOT 0.5  PROT 9.6*  ALBUMIN 4.6   CBC:  Recent Labs  05/25/14 0955  WBC 9.1  NEUTROABS 6.7  HGB 16.5  HCT 48.0  MCV 94.5  PLT 380   CBG:  Recent Labs  05/25/14 1238 05/25/14 1350 05/25/14 1455  05/25/14 1617 05/25/14 1619 05/25/14 1620  GLUCAP >600* >600* 598* 479* >600* 442*   Urinalysis:  Recent Labs  05/25/14 1234  COLORURINE STRAW*  LABSPEC 1.036*  PHURINE 6.0  GLUCOSEU >1000*  HGBUR MODERATE*  BILIRUBINUR NEGATIVE  KETONESUR 15*  PROTEINUR 30*  UROBILINOGEN 0.2  NITRITE NEGATIVE  LEUKOCYTESUR NEGATIVE    Imaging results:  Dg Chest Portable 1 View  05/25/2014   CLINICAL DATA:  Chest tightness.  EXAM: PORTABLE CHEST - 1 VIEW  COMPARISON:  01/04/2008  FINDINGS: The  heart size and mediastinal contours are within normal limits. Both lungs are clear. The visualized skeletal structures are unremarkable.  IMPRESSION: No active disease.   Electronically Signed   By: Lajean Manes M.D.   On: 05/25/2014 11:40    Other results: EKG: not available at time of HandP  Assessment & Plan by Problem: Principal Problem:   Hyperglycemic hyperosmolar nonketotic coma Active Problems:   Gout  Hyperglycemia hyperosmolar nonketotic state vs early DKA The patient has severe hyperglycemia. His mental status is clear. He has an elevated AG, but no significant acidosis on BMP making DKA unlikely. However, he complains of abdominal pain and nausea and vomiting. This appears to be 2/2 new onset DM. - glucommander protocol - BMP q 2 - follow K closely and replete as it decreases - IVFs 2 L in ED, now at 150 cc/hr - NPO pending gap closure - F/U a1c  Hyperkalemia Likely due to mild acidoses. Will trend K via BMP q 2 hr. Will likely decrease and may require supplementation. - f/u EKG  Hyponatremia Likely pseudohyponatremia due to hyperglycemia, Na corrects to 146. - treat hyperglycemia as above.  Gout - stable - continue home colchicine.  DVT PPX: Heparin sq  Diet NPO, pending AG closure x 2  Dispo: Disposition is deferred at this time, awaiting improvement of current medical problems. Anticipated discharge in approximately 2-3 day(s).   The patient does have a current PCP  Clinton Gallant, MD) and does need an [facility] hospital follow-up appointment after discharge.  The patient does not have transportation limitations that hinder transportation to clinic appointments.  Signed: Marrion Coy, MD 05/25/2014, 5:34 PM  Attending physician admission note: 53 year old African American man with history of hypertension and gout who recently to be prediabetic when a random blood sugar drawn at time of a 03/11/2014 clinic visit was 185. Hemoglobin A1c 6.0. Initial recommendation made with respect to lifestyle and diet changes. Patient declined to meet with the diabetes educator. He now presents with a 2 to three-week history of progressive weakness. He then developed polyuria and polydipsia. Blood sugar on presentation at 1300. Elevated potassium of 5.4 but not yet grossly acidemic with serum bicarbonate of 23. Anion gap difficult to calculate in view of marked hyperglycemia resulting in hyponatremia but arterial blood gas with pH 7.35. He was started on insulin infusion per protocol. He is responding nicely.  Physical exam: BP 136/102  Pulse 76  Temp(Src) 98.6 F (37 C) (Oral)  Resp 13  Ht 5\' 11"  (1.803 m)  Wt 201 lb (91.173 kg)  BMI 28.05 kg/m2  SpO2 96% Well-nourished African American man in no distress Lungs with minimal basilar rales. Regular cardiac rhythm no murmur gallop or rub. Abdomen is soft and nontender without mass or organomegaly. Extremities no edema. No calf tenderness. Neurologic: He is alert and oriented, motor strength is 5 over 5, reflexes absent but symmetric lower extremities, 1+ symmetric uppers extremities. Pupils equal reactive to light. Cranial nerves are grossly normal.  Impression: #1. Hyperosmolar nonketotic new-onset diabetes #2. Essential hypertension #3. Gout  Problem list, physical findings, and management plan, accurate as recorded by resident physician Dr. Marrion Coy. Hydration, insulin, anticipate fall in potassium and  need for replacement.  Appropriate diabetic medication regimen will be initiated when he is otherwise stable. He'll continue followup in the outpatient clinic by Dr. Algis Liming.  Murriel Hopper, MD, [place]  Hematology-Oncology/Internal Medicine

## 2014-05-25 NOTE — Telephone Encounter (Signed)
A male who identifies herself as pt's wife calls and states pt has been since 5/26 with slurred speech, difficulty swallowing, unsteady gait. He states he feels bad. She is ask to take him to ED asap, she is agreeable

## 2014-05-25 NOTE — ED Notes (Signed)
Pharmacy tech and respiratory at bedside drawing blood gas. This RN started second liter bag for bolus. Patient in NAD

## 2014-05-25 NOTE — ED Notes (Addendum)
Pt weakness starting last week due to gout attack; pt also states he had dizziness; dysphagia. States he feels weak. Has lost 30 pounds in a week. Pt also states he has crystals in his urine that he has noticed around the foreskin of his penis. Wife also states speech is abnormla; sounds slurred like he is lethargic (been going on for several days)

## 2014-05-26 DIAGNOSIS — I10 Essential (primary) hypertension: Secondary | ICD-10-CM

## 2014-05-26 LAB — GLUCOSE, CAPILLARY
GLUCOSE-CAPILLARY: 151 mg/dL — AB (ref 70–99)
GLUCOSE-CAPILLARY: 158 mg/dL — AB (ref 70–99)
GLUCOSE-CAPILLARY: 172 mg/dL — AB (ref 70–99)
GLUCOSE-CAPILLARY: 220 mg/dL — AB (ref 70–99)
GLUCOSE-CAPILLARY: 222 mg/dL — AB (ref 70–99)
GLUCOSE-CAPILLARY: 182 mg/dL — AB (ref 70–99)
GLUCOSE-CAPILLARY: 146 mg/dL — AB (ref 70–99)
GLUCOSE-CAPILLARY: 154 mg/dL — AB (ref 70–99)
GLUCOSE-CAPILLARY: 154 mg/dL — AB (ref 70–99)
GLUCOSE-CAPILLARY: 157 mg/dL — AB (ref 70–99)
Glucose-Capillary: 173 mg/dL — ABNORMAL HIGH (ref 70–99)
Glucose-Capillary: 236 mg/dL — ABNORMAL HIGH (ref 70–99)
Glucose-Capillary: 231 mg/dL — ABNORMAL HIGH (ref 70–99)
Glucose-Capillary: 124 mg/dL — ABNORMAL HIGH (ref 70–99)
Glucose-Capillary: 157 mg/dL — ABNORMAL HIGH (ref 70–99)
Glucose-Capillary: >600 mg/dL (ref 70–99)
Glucose-Capillary: 238 mg/dL — ABNORMAL HIGH (ref 70–99)
Glucose-Capillary: 461 mg/dL — ABNORMAL HIGH (ref 70–99)

## 2014-05-26 LAB — BASIC METABOLIC PANEL
BUN: 17 mg/dL (ref 6–23)
BUN: 15 mg/dL (ref 6–23)
BUN: 14 mg/dL (ref 6–23)
BUN: 13 mg/dL (ref 6–23)
BUN: 15 mg/dL (ref 6–23)
CALCIUM: 9.7 mg/dL (ref 8.4–10.5)
CALCIUM: 9.2 mg/dL (ref 8.4–10.5)
CHLORIDE: 104 meq/L (ref 96–112)
CHLORIDE: 102 meq/L (ref 96–112)
CO2: 28 meq/L (ref 19–32)
CO2: 26 mEq/L (ref 19–32)
CO2: 27 mEq/L (ref 19–32)
CO2: 26 meq/L (ref 19–32)
CO2: 24 mEq/L (ref 19–32)
CREATININE: 0.95 mg/dL (ref 0.50–1.35)
Calcium: 10.2 mg/dL (ref 8.4–10.5)
Calcium: 10.6 mg/dL — ABNORMAL HIGH (ref 8.4–10.5)
Calcium: 10.2 mg/dL (ref 8.4–10.5)
Chloride: 106 mEq/L (ref 96–112)
Chloride: 98 mEq/L (ref 96–112)
Chloride: 93 mEq/L — ABNORMAL LOW (ref 96–112)
Creatinine, Ser: 0.96 mg/dL (ref 0.50–1.35)
Creatinine, Ser: 1.04 mg/dL (ref 0.50–1.35)
Creatinine, Ser: 0.85 mg/dL (ref 0.50–1.35)
Creatinine, Ser: 0.87 mg/dL (ref 0.50–1.35)
GFR calc Af Amer: >90 mL/min (ref >90)
GFR calc Af Amer: >90 mL/min (ref >90)
GFR calc non Af Amer: >90 mL/min (ref >90)
GFR calc non Af Amer: >90 mL/min (ref >90)
GFR calc non Af Amer: >90 mL/min (ref >90)
GFR, EST NON AFRICAN AMERICAN: 80 mL/min — AB (ref >90)
GLUCOSE: 156 mg/dL — AB (ref 70–99)
Glucose, Bld: 235 mg/dL — ABNORMAL HIGH (ref 70–99)
Glucose, Bld: 124 mg/dL — ABNORMAL HIGH (ref 70–99)
Glucose, Bld: 289 mg/dL — ABNORMAL HIGH (ref 70–99)
Glucose, Bld: 457 mg/dL — ABNORMAL HIGH (ref 70–99)
POTASSIUM: 4.5 meq/L (ref 3.7–5.3)
Potassium: 3.7 mEq/L (ref 3.7–5.3)
Potassium: 4.2 mEq/L (ref 3.7–5.3)
Potassium: 3.6 mEq/L — ABNORMAL LOW (ref 3.7–5.3)
Potassium: 3.9 mEq/L (ref 3.7–5.3)
SODIUM: 144 meq/L (ref 137–147)
SODIUM: 130 meq/L — AB (ref 137–147)
Sodium: 143 mEq/L (ref 137–147)
Sodium: 145 mEq/L (ref 137–147)
Sodium: 138 mEq/L (ref 137–147)

## 2014-05-26 LAB — HEMOGLOBIN A1C
Hgb A1c MFr Bld: 13.7 % — ABNORMAL HIGH (ref <5.7)
Mean Plasma Glucose: 346 mg/dL — ABNORMAL HIGH (ref <117)

## 2014-05-26 MED ORDER — LIVING WELL WITH DIABETES BOOK
Freq: Once | Status: AC
  Administered 2014-05-26: 12:00:00
  Filled 2014-05-26: qty 1

## 2014-05-26 MED ORDER — DILTIAZEM HCL ER BEADS 120 MG PO CP24
120.0000 mg | ORAL_CAPSULE | Freq: Every day | ORAL | Status: DC
  Administered 2014-05-26 – 2014-05-29 (×4): 120 mg via ORAL
  Filled 2014-05-26 (×4): qty 1

## 2014-05-26 MED ORDER — INSULIN ASPART 100 UNIT/ML ~~LOC~~ SOLN
0.0000 [IU] | Freq: Three times a day (TID) | SUBCUTANEOUS | Status: DC
  Administered 2014-05-26: 3 [IU] via SUBCUTANEOUS
  Administered 2014-05-27: 5 [IU] via SUBCUTANEOUS
  Administered 2014-05-27: 3 [IU] via SUBCUTANEOUS

## 2014-05-26 MED ORDER — FLUCONAZOLE 200 MG PO TABS
200.0000 mg | ORAL_TABLET | Freq: Once | ORAL | Status: AC
  Administered 2014-05-26: 200 mg via ORAL
  Filled 2014-05-26: qty 1

## 2014-05-26 MED ORDER — SODIUM CHLORIDE 0.9 % IV SOLN
INTRAVENOUS | Status: AC
  Filled 2014-05-26: qty 1

## 2014-05-26 MED ORDER — POTASSIUM CHLORIDE CRYS ER 20 MEQ PO TBCR
40.0000 meq | EXTENDED_RELEASE_TABLET | Freq: Once | ORAL | Status: AC
  Administered 2014-05-26: 40 meq via ORAL
  Filled 2014-05-26: qty 2

## 2014-05-26 MED ORDER — INSULIN ASPART 100 UNIT/ML ~~LOC~~ SOLN
10.0000 [IU] | Freq: Once | SUBCUTANEOUS | Status: AC
  Administered 2014-05-26: 10 [IU] via SUBCUTANEOUS

## 2014-05-26 MED ORDER — POTASSIUM CHLORIDE 10 MEQ/100ML IV SOLN
10.0000 meq | INTRAVENOUS | Status: AC
  Administered 2014-05-26 (×2): 10 meq via INTRAVENOUS
  Filled 2014-05-26 (×2): qty 100

## 2014-05-26 MED ORDER — INSULIN GLARGINE 100 UNIT/ML ~~LOC~~ SOLN
9.0000 [IU] | Freq: Every day | SUBCUTANEOUS | Status: DC
  Administered 2014-05-26 – 2014-05-27 (×2): 9 [IU] via SUBCUTANEOUS
  Filled 2014-05-26 (×2): qty 0.09

## 2014-05-26 MED ORDER — ACETAMINOPHEN 325 MG PO TABS
650.0000 mg | ORAL_TABLET | Freq: Four times a day (QID) | ORAL | Status: DC | PRN
  Administered 2014-05-26 – 2014-05-28 (×2): 650 mg via ORAL
  Filled 2014-05-26 (×2): qty 2

## 2014-05-26 NOTE — Progress Notes (Addendum)
Outpatient DM education entered for Internal Medicine clinic followup. Will need to be cosigned by physician.  Harvel Ricks RN BSN CDE

## 2014-05-26 NOTE — Progress Notes (Signed)
Admitted with hyperglycemia, HHNK.  On glucostabilizer.  No history of DM.  HgbA1C is 13.7%.  Started on Lantus 9 units daily.  Recommend increasing Lantus to 15 units daily  (0.2 units/kg would be equal to 18 units daily) and add Novolog MODERATE correction scale AC & HS when transitioning off  drip.  Will need DM education.  Staff RNs to start teaching patient how to give insulin if patient is to be discharged with insulin.  Consult of dietician, Living Well with Diabetes booklet ordered from pharmacy.  Would benefit from outpatient DM education.  Will continue to follow while in hospital.  Harvel Ricks RN BSN CDE

## 2014-05-26 NOTE — Progress Notes (Signed)
Utilization review completed. Fanny Agan, RN, BSN. 

## 2014-05-26 NOTE — Progress Notes (Signed)
Subjective: No acute events overnight.  Patient seen and examined this AM.  Improving this AM.  No complaints.  Objective: Vital signs in last 24 hours: Filed Vitals:   05/25/14 2311 05/26/14 0305 05/26/14 0730 05/26/14 1116  BP: 134/93 128/89 136/91 136/102  Pulse: 91 81 78 76  Temp: 97.8 F (36.6 C) 98.2 F (36.8 C) 98.4 F (36.9 C) 98.6 F (37 C)  TempSrc: Oral Oral Oral Oral  Resp: 19 13 14 13   Height:      Weight:      SpO2: 100% 96% 93% 96%   Weight change:   Intake/Output Summary (Last 24 hours) at 05/26/14 1218 Last data filed at 05/26/14 0900  Gross per 24 hour  Intake   3350 ml  Output   1600 ml  Net   1750 ml   General: resting in bed in NAD HEENT: mucous membranes dry Cardiac: RRR, no rubs, murmurs or gallops Pulm: + rhonchi, moving normal volumes of air Abd: soft, nontender, nondistended, BS present Ext: warm and well perfused, no pedal edema Neuro: alert and oriented X3, responding appropriately, able to move extremities voluntarily  Lab Results: Basic Metabolic Panel:  Recent Labs Lab 05/26/14 0641 05/26/14 0910  NA 145 144  K 4.5 4.2  CL 106 104  CO2 26 28  GLUCOSE 124* 156*  BUN 15 14  CREATININE 1.04 0.85  CALCIUM 10.6* 10.2   Liver Function Tests:  Recent Labs Lab 05/25/14 0955  AST 19  ALT 27  ALKPHOS 248*  BILITOT 0.5  PROT 9.6*  ALBUMIN 4.6   CBC:  Recent Labs Lab 05/25/14 0955 05/25/14 1953  WBC 9.1 12.5*  NEUTROABS 6.7  --   HGB 16.5 16.0  HCT 48.0 44.3  MCV 94.5 89.1  PLT 380 361   CBG:  Recent Labs Lab 05/26/14 0600 05/26/14 0703 05/26/14 0824 05/26/14 0915 05/26/14 1040 05/26/14 1113  GLUCAP 146* 124* 154* 154* 157* 157*   Hemoglobin A1C:  Recent Labs Lab 05/25/14 1953  HGBA1C 13.7*   Urinalysis:  Recent Labs Lab 05/25/14 1234  COLORURINE STRAW*  LABSPEC 1.036*  PHURINE 6.0  GLUCOSEU >1000*  HGBUR MODERATE*  BILIRUBINUR NEGATIVE  KETONESUR 15*  PROTEINUR 30*  UROBILINOGEN 0.2    NITRITE NEGATIVE  LEUKOCYTESUR NEGATIVE   Micro Results: Recent Results (from the past 240 hour(s))  MRSA PCR SCREENING     Status: None   Collection Time    05/25/14  9:12 PM      Result Value Ref Range Status   MRSA by PCR NEGATIVE  NEGATIVE Final   Comment:            The GeneXpert MRSA Assay (FDA     approved for NASAL specimens     only), is one component of a     comprehensive MRSA colonization     surveillance program. It is not     intended to diagnose MRSA     infection nor to guide or     monitor treatment for     MRSA infections.   Studies/Results: Dg Chest Portable 1 View  05/25/2014   CLINICAL DATA:  Chest tightness.  EXAM: PORTABLE CHEST - 1 VIEW  COMPARISON:  01/04/2008  FINDINGS: The heart size and mediastinal contours are within normal limits. Both lungs are clear. The visualized skeletal structures are unremarkable.  IMPRESSION: No active disease.   Electronically Signed   By: Lajean Manes M.D.   On: 05/25/2014 11:40   Medications:  I have reviewed the patient's current medications. Scheduled Meds: . allopurinol  100 mg Oral Daily  . heparin  5,000 Units Subcutaneous 3 times per day  . insulin aspart  0-9 Units Subcutaneous TID WC  . insulin glargine  9 Units Subcutaneous Daily  . living well with diabetes book   Does not apply Once   Continuous Infusions: . sodium chloride 150 mL/hr at 05/25/14 1808  . dextrose 5 % and 0.45% NaCl    . insulin (NOVOLIN-R) infusion     PRN Meds:.dextrose Assessment/Plan: 53 yo man with hx of HTN and gout presenting with hyperglycemia.   Hyperglycemia hyperosmolar nonketotic state vs early DKA:  The patient has severe hyperglycemia. His mental status is clear. He has an elevated AG, but no significant acidosis (pH 7.3, AG 23, bicarb 23) making DKA unlikely.  However, he complains of abdominal pain and nausea and vomiting. This appears to be 2/2 new onset DM.  Hgb A1c 13.7 (this admission).  AG closed x 2.  CBG 150s. -  start Lantus 9 units (based on weight, half of TDD) and SSI sensitive - continue insulin gtt for 2 hours after [place] Lantus given - start carb modified diet - BMP q 2 --> q6h to monitor AG and K - replete K prn; K goal 4-5 - continue D5-0.5NS, can likely d/c or slow if patient eating well  Hyperkalemia: 5.4 at admission likely 2/2 to acidosis.  EKG ordered but not done. K then dropped (as expected) and repleted with 2 runs of 62mEq KCl.  K now stable at 4.2.   - monitor BMP q6h - EKG  Hyponatremia, resolved:  Likely pseudohyponatremia 2/2 hyperglycemia. Na corrects to 146.  Na stable at 144 today with treatment of hyperglycemia. - continue D5-0.5NS for now - monitor BMP  HTN:  DBP elevated today. - resume home diltiazem - monitor BP   Gout:  stable  - continue home allopurinol  Diet:  Carb mod VTE ppx: Heparin [place] Code:  full   Dispo: Disposition is deferred at this time, awaiting improvement of current medical problems.  Anticipated discharge in approximately 2-3 day(s).   The patient does have a current PCP Clinton Gallant, MD) and does need an [facility] hospital follow-up appointment after discharge.  The patient does not know have transportation limitations that hinder transportation to clinic appointments.  .Services Needed at time of discharge: Y = Yes, Blank = No PT:   OT:   RN:   Equipment:   Other:     LOS: 1 day   Duwaine Maxin, DO 05/26/2014, 12:18 PM

## 2014-05-26 NOTE — Plan of Care (Addendum)
Problem: Food- and Nutrition-Related Knowledge Deficit (NB-1.1) Goal: Nutrition education Formal process to instruct or train a patient/client in a skill or to impart knowledge to help patients/clients voluntarily manage or modify food choices and eating behavior to maintain or improve health. Outcome: Completed/Met Date Met:  05/26/14  RD consulted for nutrition education regarding diabetes. Patient with new onset diabetes. Also c/o his throat hurting when he swallows. RN to relay throat issue to Physician.    Lab Results  Component Value Date    HGBA1C 13.7* 05/25/2014    RD provided "Carbohydrate Counting for People with Diabetes" handout from the Academy of [employer] and Dietetics. Discussed different food groups and their effects on blood sugar, emphasizing carbohydrate-containing foods. Provided list of carbohydrates and recommended serving sizes of common foods.  Discussed importance of controlled and consistent carbohydrate intake throughout the day. Provided examples of ways to balance meals/snacks and encouraged intake of high-fiber, whole grain complex carbohydrates. Teach back method used.  Expect fair compliance. Will order OP DM education.  Body mass index is 28.05 kg/(m^2). Pt meets criteria for overweight based on current BMI.  Current diet order is CHO-modified, patient is consuming approximately 50% of meals at this time. Labs and medications reviewed. No further nutrition interventions warranted at this time. RD contact information provided. If additional nutrition issues arise, please re-consult RD.  Molli Barrows, RD, LDN, [place] Pager [phone] After Hours Pager [phone]

## 2014-05-27 ENCOUNTER — Inpatient Hospital Stay (HOSPITAL_COMMUNITY): Payer: No Typology Code available for payment source

## 2014-05-27 DIAGNOSIS — R220 Localized swelling, mass and lump, head: Secondary | ICD-10-CM

## 2014-05-27 DIAGNOSIS — R1310 Dysphagia, unspecified: Secondary | ICD-10-CM

## 2014-05-27 DIAGNOSIS — R221 Localized swelling, mass and lump, neck: Secondary | ICD-10-CM

## 2014-05-27 LAB — BASIC METABOLIC PANEL
BUN: 12 mg/dL (ref 6–23)
BUN: 11 mg/dL (ref 6–23)
BUN: 14 mg/dL (ref 6–23)
CALCIUM: 9.5 mg/dL (ref 8.4–10.5)
CHLORIDE: 93 meq/L — AB (ref 96–112)
CO2: 23 meq/L (ref 19–32)
CO2: 26 mEq/L (ref 19–32)
CO2: 22 meq/L (ref 19–32)
CREATININE: 0.81 mg/dL (ref 0.50–1.35)
CREATININE: 0.92 mg/dL (ref 0.50–1.35)
CREATININE: 0.75 mg/dL (ref 0.50–1.35)
Calcium: 9.8 mg/dL (ref 8.4–10.5)
Calcium: 9.3 mg/dL (ref 8.4–10.5)
Chloride: 97 mEq/L (ref 96–112)
Chloride: 95 mEq/L — ABNORMAL LOW (ref 96–112)
GFR calc Af Amer: >90 mL/min (ref >90)
GFR calc Af Amer: >90 mL/min (ref >90)
GFR calc Af Amer: >90 mL/min (ref >90)
GFR calc non Af Amer: >90 mL/min (ref >90)
GFR calc non Af Amer: >90 mL/min (ref >90)
GLUCOSE: 273 mg/dL — AB (ref 70–99)
GLUCOSE: 319 mg/dL — AB (ref 70–99)
GLUCOSE: 275 mg/dL — AB (ref 70–99)
Potassium: 3.7 mEq/L (ref 3.7–5.3)
Potassium: 4.2 mEq/L (ref 3.7–5.3)
Potassium: 3.9 mEq/L (ref 3.7–5.3)
SODIUM: 132 meq/L — AB (ref 137–147)
Sodium: 134 mEq/L — ABNORMAL LOW (ref 137–147)
Sodium: 132 mEq/L — ABNORMAL LOW (ref 137–147)

## 2014-05-27 LAB — GLUCOSE, CAPILLARY
GLUCOSE-CAPILLARY: 337 mg/dL — AB (ref 70–99)
GLUCOSE-CAPILLARY: 231 mg/dL — AB (ref 70–99)
Glucose-Capillary: 267 mg/dL — ABNORMAL HIGH (ref 70–99)
Glucose-Capillary: 216 mg/dL — ABNORMAL HIGH (ref 70–99)
Glucose-Capillary: 452 mg/dL — ABNORMAL HIGH (ref 70–99)
Glucose-Capillary: 382 mg/dL — ABNORMAL HIGH (ref 70–99)

## 2014-05-27 LAB — CBC
HEMATOCRIT: 39.7 % (ref 39.0–52.0)
Hemoglobin: 13.8 g/dL (ref 13.0–17.0)
MCH: 31.6 pg (ref 26.0–34.0)
MCHC: 34.8 g/dL (ref 30.0–36.0)
MCV: 90.8 fL (ref 78.0–100.0)
Platelets: 319 10*3/uL (ref 150–400)
RBC: 4.37 MIL/uL (ref 4.22–5.81)
RDW: 13.5 % (ref 11.5–15.5)
WBC: 9.3 10*3/uL (ref 4.0–10.5)

## 2014-05-27 LAB — HIV ANTIBODY (ROUTINE TESTING W REFLEX): HIV 1&2 Ab, 4th Generation: NONREACTIVE

## 2014-05-27 MED ORDER — INSULIN ASPART 100 UNIT/ML ~~LOC~~ SOLN
0.0000 [IU] | Freq: Three times a day (TID) | SUBCUTANEOUS | Status: DC
  Administered 2014-05-27: 3 [IU] via SUBCUTANEOUS
  Administered 2014-05-28: 5 [IU] via SUBCUTANEOUS
  Administered 2014-05-28: 7 [IU] via SUBCUTANEOUS

## 2014-05-27 MED ORDER — IOHEXOL 300 MG/ML  SOLN
75.0000 mL | Freq: Once | INTRAMUSCULAR | Status: AC | PRN
  Administered 2014-05-27: 75 mL via INTRAVENOUS

## 2014-05-27 MED ORDER — INSULIN ASPART 100 UNIT/ML ~~LOC~~ SOLN: 0.0000 [IU] | Freq: Three times a day (TID) | SUBCUTANEOUS | Status: DC

## 2014-05-27 MED ORDER — SODIUM CHLORIDE 0.9 % IV SOLN
3.0000 g | Freq: Four times a day (QID) | INTRAVENOUS | Status: DC
  Administered 2014-05-27 – 2014-05-28 (×3): 3 g via INTRAVENOUS
  Filled 2014-05-27 (×6): qty 3

## 2014-05-27 MED ORDER — INSULIN GLARGINE 100 UNIT/ML ~~LOC~~ SOLN
12.0000 [IU] | Freq: Every day | SUBCUTANEOUS | Status: DC
  Administered 2014-05-28: 12 [IU] via SUBCUTANEOUS
  Filled 2014-05-27: qty 0.12

## 2014-05-27 MED ORDER — MAGIC MOUTHWASH
10.0000 mL | Freq: Four times a day (QID) | ORAL | Status: DC | PRN
  Filled 2014-05-27 (×2): qty 10

## 2014-05-27 MED ORDER — FLUCONAZOLE 100 MG PO TABS
100.0000 mg | ORAL_TABLET | Freq: Every day | ORAL | Status: DC
  Administered 2014-05-27 – 2014-05-29 (×3): 100 mg via ORAL
  Filled 2014-05-27 (×3): qty 1

## 2014-05-27 MED ORDER — CHLORHEXIDINE GLUCONATE 0.12 % MT SOLN: 10.0000 mL | Freq: Two times a day (BID) | OROMUCOSAL | Status: DC

## 2014-05-27 MED ORDER — INSULIN ASPART 100 UNIT/ML ~~LOC~~ SOLN
10.0000 [IU] | Freq: Once | SUBCUTANEOUS | Status: AC
  Administered 2014-05-27: 10 [IU] via SUBCUTANEOUS

## 2014-05-27 NOTE — Progress Notes (Signed)
Results for [name], [name] (MRN [id number]) as of 05/27/2014 07:30  Ref. Range 05/26/2014 18:03 05/26/2014 21:56 05/27/2014 01:01  Glucose-Capillary Latest Range: 70-99 mg/dL 238 (H) 461 (H) 337 (H)   CBGs greater than 180 mg/dl.  Recommend increasing Lantus to 20-25  units daily (0.2 - 0.3 units/kg)  And increase Novolog correction scale to MODERATE TID and HS.  Will follow.  Harvel Ricks RN BSN CDE

## 2014-05-27 NOTE — Progress Notes (Signed)
CRITICAL VALUE ALERT  Critical value received:  CBG 452  Date of notification:  05/27/14  Time of notification:  2225  Critical value read back: yes  Nurse who received alert: L. Dionicia Abler  MD notified (1st page):  Dr Ronnald Ramp  Time of first page: 2220  MD notified (2nd page):  Time of second page:  Responding MD: Dr Ronnald Ramp  Time MD responded: 2225

## 2014-05-27 NOTE — Progress Notes (Signed)
Pt's CBG 337 on recheck. MD notified. No new orders received. Will continue to monitor.

## 2014-05-27 NOTE — Progress Notes (Signed)
Transferring to 6N26 via w/c with belongings. Wife aware of new room number. Lab drawn blood culture prior to transfer.

## 2014-05-27 NOTE — Progress Notes (Signed)
Pt's HS CBG = 461. MD notified. New orders received. Will carry out orders and continue to monitor.

## 2014-05-27 NOTE — Care Management Note (Addendum)
  Page 1 of 1   05/27/2014     3:01:14 PM CARE MANAGEMENT NOTE 05/27/2014  Patient:  [name],[name]   Account Number:  [id number]  Date Initiated:  05/27/2014  Documentation initiated by:  GRAVES-BIGELOW,Memorie Yokoyama  Subjective/Objective Assessment:   Pt admitted for Weakness and excessive thirst.  BMP revealed a glucose of 1300.     Action/Plan:   CM has a benefits check in process and will make pt aware once completed for insulin cost. No further needs @ this time.   Anticipated DC Date:  05/28/2014   Anticipated DC Plan:  [place]  CM consult      Choice offered to / List presented to:             Status of service:  Completed, signed off Medicare Important Message given?  NO (If response is "NO", the following Medicare IM given date fields will be blank) Date Medicare IM given:   Date Additional Medicare IM given:    Discharge Disposition:  HOME/SELF CARE  Per UR Regulation:  Reviewed for med. necessity/level of care/duration of stay  If discussed at [place] of Stay Meetings, dates discussed:    Comments:   Benefits Check: Insulin novolog vial: auth needed [id number] / co-pay $55 pen: auth needed [phone] /co-pay $55  lantus vial: auth required [phone]/ co-pay $55 pen: auth required [phone] /co-pay $55  patient can use: [facility], [place], [place] [facility]

## 2014-05-27 NOTE — Progress Notes (Signed)
Report called pt to transfer to [phone]

## 2014-05-27 NOTE — Progress Notes (Addendum)
Subjective: No acute events overnight.  Patient seen and examined this AM.  New left facial swelling started last night.  He says he has been told he has dental problems and thinks he will likely need teeth removed.  He has not experienced facial swelling before.    Objective: Vital signs in last 24 hours: Filed Vitals:   05/26/14 2306 05/27/14 0314 05/27/14 0744 05/27/14 1207  BP: 167/109 127/84 125/87 116/71  Pulse: 86 93 73 76  Temp: 98.3 F (36.8 C) 98.1 F (36.7 C) 98.4 F (36.9 C) 98.3 F (36.8 C)  TempSrc: Oral Oral Axillary Oral  Resp: 23 20 15 19   Height:      Weight:      SpO2: 99% 98% 96% 96%   Weight change:   Intake/Output Summary (Last 24 hours) at 05/27/14 1238 Last data filed at 05/27/14 0744  Gross per 24 hour  Intake    480 ml  Output   1250 ml  Net   -770 ml   General: resting in bed in NAD HEENT: + white plaque on tonge Cardiac: RRR, no rubs, murmurs or gallops Pulm: + rhonchi, moving normal volumes of air Abd: soft, nontender, nondistended, BS present Ext: warm and well perfused, no pedal edema Neuro: alert and oriented X3, responding appropriately, able to move extremities voluntarily  Lab Results: Basic Metabolic Panel:  Recent Labs Lab 05/27/14 0349 05/27/14 0918  NA 134* 132*  K 3.7 4.2  CL 97 93*  CO2 23 26  GLUCOSE 273* 319*  BUN 12 11  CREATININE 0.81 0.92  CALCIUM 9.5 9.8   Liver Function Tests:  Recent Labs Lab 05/25/14 0955  AST 19  ALT 27  ALKPHOS 248*  BILITOT 0.5  PROT 9.6*  ALBUMIN 4.6   CBC:  Recent Labs Lab 05/25/14 0955 05/25/14 1953 05/27/14 0349  WBC 9.1 12.5* 9.3  NEUTROABS 6.7  --   --   HGB 16.5 16.0 13.8  HCT 48.0 44.3 39.7  MCV 94.5 89.1 90.8  PLT 380 361 319   CBG:  Recent Labs Lab 05/26/14 1113 05/26/14 1803 05/26/14 2156 05/27/14 0101 05/27/14 0830 05/27/14 1209  GLUCAP 157* 238* 461* 337* 267* 231*   Hemoglobin A1C:  Recent Labs Lab 05/25/14 1953  HGBA1C 13.7*    Urinalysis:  Recent Labs Lab 05/25/14 1234  COLORURINE STRAW*  LABSPEC 1.036*  PHURINE 6.0  GLUCOSEU >1000*  HGBUR MODERATE*  BILIRUBINUR NEGATIVE  KETONESUR 15*  PROTEINUR 30*  UROBILINOGEN 0.2  NITRITE NEGATIVE  LEUKOCYTESUR NEGATIVE   Micro Results: Recent Results (from the past 240 hour(s))  MRSA PCR SCREENING     Status: None   Collection Time    05/25/14  9:12 PM      Result Value Ref Range Status   MRSA by PCR NEGATIVE  NEGATIVE Final   Comment:            The GeneXpert MRSA Assay (FDA     approved for NASAL specimens     only), is one component of a     comprehensive MRSA colonization     surveillance program. It is not     intended to diagnose MRSA     infection nor to guide or     monitor treatment for     MRSA infections.   Studies/Results: Dg Orthopantogram  05/27/2014   CLINICAL DATA:  Left jaw pain and swelling.  Upper left loose teeth.  EXAM: ORTHOPANTOGRAM/PANORAMIC  COMPARISON:  Head CT 01/04/2008  FINDINGS: Poor dentition. No acute fracture or dislocation. No periapical lucencies identified. No osseous destruction. Lucency about the low left side of the maxilla is favored to be artifactual.  IMPRESSION: No convincing evidence of acute osseous finding or explanation for left-sided jaw pain/swelling. Apparent relative lucency about the left side of the maxilla is favored to be artifactual. CT may be informative.   Electronically Signed   By: Abigail Miyamoto M.D.   On: 05/27/2014 11:08   Medications: I have reviewed the patient's current medications. Scheduled Meds: . allopurinol  100 mg Oral Daily  . diltiazem  120 mg Oral Daily  . fluconazole  100 mg Oral Daily  . heparin  5,000 Units Subcutaneous 3 times per day  . insulin aspart  0-9 Units Subcutaneous TID WC  . insulin glargine  9 Units Subcutaneous Daily   Continuous Infusions: . sodium chloride 150 mL/hr at 05/25/14 1808  . dextrose 5 % and 0.45% NaCl     PRN Meds:.acetaminophen, dextrose,  magic mouthwash  Assessment/Plan: 53 yo man with hx of HTN and gout presenting with hyperglycemia.   Left maxillofacial swelling:  Patient with poor dentition and says he will need his teeth removed.  He has never had swelling before.  He denies throat swelling or difficulty breathing.  Afebrile, normal WBC.  Orthopantogram this AM - no convincing evidence of acute osseous findings; apparent relative lucency about the left side of the maxilla is favored to be artifactual.  - CT maxillofacial to r/o abscess - magic mouthwash  Odynophagia (?esophageal candidiasis):  Patient reports painful swallowing (for solids) for he past month.  He has white patches at the back of his tongue.  He says he has lost significant weight.  Looking back in the chart he was 38 pounds heavier at clinic visit 1 month ago.   HIV negative this admission. - soft (carb mod) diet - diflucan - magic mouthwash - consider endoscopy if no improvement with diflucan after a few days  Hyperglycemia hyperosmolar nonketotic state vs early DKA:  The patient presented with severe hyperglycemia. His mental status was clear. He had an elevated AG, but no significant acidosis (pH 7.3, AG 23, bicarb 23) making DKA unlikely.  However, he complains of abdominal pain and nausea and vomiting. This appears to be 2/2 new onset DM.  Hgb A1c 13.7 (this admission).  Off insulin gtt for past 24 hours.  CBGs 230s-460 in the past 24 hours. - increase Lantus 9 units --> Lantus 12 units daily; continue SSI sensitive (he is not requiring elevated doses at meals) - continue soft carb modified diet (patient complaining of odynophagia for past month) - BMP q6h --> BID to monitor K - replete K prn; K goal 4-5 - stop D5-0.5NS now that he is eating and given his elevated CBGs (however, I called nurse and fluids had not been running for some reason)  Hyperkalemia: 5.4 at admission likely 2/2 to acidosis.  EKG ordered but not done.  K then dropped (as  expected) and repleted with 2 runs of 65mEq KCl.  K now stable at 4.2.   - monitor BMP BID - EKG  Hyponatremia:  Likely pseudohyponatremia 2/2 hyperglycemia. Na corrects to 136.   - increasing insulin as above - monitor BMP  HTN:  stable - continue home diltiazem - monitor BP   Gout:  stable  - continue home allopurinol  Diet:  Carb mod (soft) VTE ppx: Heparin [place] Code:  full   Dispo: Disposition is deferred  at this time, awaiting improvement of current medical problems.  Anticipated discharge in approximately 2-3 day(s).   The patient does have a current PCP Clinton Gallant, MD) and does need an [facility] hospital follow-up appointment after discharge.  The patient does not know have transportation limitations that hinder transportation to clinic appointments.  .Services Needed at time of discharge: Y = Yes, Blank = No PT:   OT:   RN:   Equipment:   Other:     LOS: 2 days   Duwaine Maxin, DO 05/27/2014, 12:38 PM Attending physician note: I personally examined this patient together with resident physician Dr. Duwaine Maxin and I concur with her evaluation and management plan summarized above. Sugars coming under control. He will likely need long-term insulin as part of his regimen. He has left facial swelling which may be related to a dental abscess. We will obtain [employer] dental films. Murriel Hopper, MD, [place]  Hematology-Oncology/Internal Medicine

## 2014-05-28 LAB — BASIC METABOLIC PANEL
BUN: 11 mg/dL (ref 6–23)
CO2: 24 meq/L (ref 19–32)
CREATININE: 0.9 mg/dL (ref 0.50–1.35)
Calcium: 9.7 mg/dL (ref 8.4–10.5)
Chloride: 101 mEq/L (ref 96–112)
GFR calc Af Amer: >90 mL/min (ref >90)
GLUCOSE: 268 mg/dL — AB (ref 70–99)
Potassium: 4.1 mEq/L (ref 3.7–5.3)
Sodium: 137 mEq/L (ref 137–147)

## 2014-05-28 LAB — GLUCOSE, CAPILLARY
GLUCOSE-CAPILLARY: 251 mg/dL — AB (ref 70–99)
GLUCOSE-CAPILLARY: 303 mg/dL — AB (ref 70–99)
GLUCOSE-CAPILLARY: 285 mg/dL — AB (ref 70–99)
Glucose-Capillary: 225 mg/dL — ABNORMAL HIGH (ref 70–99)

## 2014-05-28 LAB — CBC WITH DIFFERENTIAL/PLATELET
BASOS ABS: 0 10*3/uL (ref 0.0–0.1)
BASOS PCT: 0 % (ref 0–1)
EOS PCT: 1 % (ref 0–5)
Eosinophils Absolute: 0.1 10*3/uL (ref 0.0–0.7)
HCT: 37.4 % — ABNORMAL LOW (ref 39.0–52.0)
Hemoglobin: 13.1 g/dL (ref 13.0–17.0)
Lymphocytes Relative: 53 % — ABNORMAL HIGH (ref 12–46)
Lymphs Abs: 4.2 10*3/uL — ABNORMAL HIGH (ref 0.7–4.0)
MCH: 32 pg (ref 26.0–34.0)
MCHC: 35 g/dL (ref 30.0–36.0)
MCV: 91.4 fL (ref 78.0–100.0)
Monocytes Absolute: 0.8 10*3/uL (ref 0.1–1.0)
Monocytes Relative: 10 % (ref 3–12)
NEUTROS PCT: 36 % — AB (ref 43–77)
Neutro Abs: 2.8 10*3/uL (ref 1.7–7.7)
PLATELETS: 307 10*3/uL (ref 150–400)
RBC: 4.09 MIL/uL — ABNORMAL LOW (ref 4.22–5.81)
RDW: 13.4 % (ref 11.5–15.5)
WBC: 7.9 10*3/uL (ref 4.0–10.5)

## 2014-05-28 MED ORDER — INSULIN ASPART 100 UNIT/ML ~~LOC~~ SOLN
0.0000 [IU] | Freq: Three times a day (TID) | SUBCUTANEOUS | Status: DC
  Administered 2014-05-28: 8 [IU] via SUBCUTANEOUS
  Administered 2014-05-29 (×2): 5 [IU] via SUBCUTANEOUS

## 2014-05-28 MED ORDER — INSULIN GLARGINE 100 UNIT/ML ~~LOC~~ SOLN
15.0000 [IU] | Freq: Every day | SUBCUTANEOUS | Status: DC
  Administered 2014-05-29: 15 [IU] via SUBCUTANEOUS
  Filled 2014-05-28: qty 0.15

## 2014-05-28 MED ORDER — HYDROCODONE-ACETAMINOPHEN 5-325 MG PO TABS
1.0000 | ORAL_TABLET | Freq: Once | ORAL | Status: AC
  Administered 2014-05-28: 1 via ORAL
  Filled 2014-05-28: qty 1

## 2014-05-28 MED ORDER — MAGIC MOUTHWASH
2.0000 mL | Freq: Three times a day (TID) | ORAL | Status: DC
  Administered 2014-05-28: 2 mL via ORAL

## 2014-05-28 MED ORDER — AMOXICILLIN-POT CLAVULANATE 500-125 MG PO TABS
1.0000 | ORAL_TABLET | Freq: Three times a day (TID) | ORAL | Status: DC
  Administered 2014-05-28 – 2014-05-29 (×3): 500 mg via ORAL
  Filled 2014-05-28 (×7): qty 1

## 2014-05-28 NOTE — Progress Notes (Addendum)
Subjective: No acute events overnight. Pt has had rapid reduction of facial swelling and decreased pain per the patient. He is eating w/o concern or problem. Pt continues to have some hyperglycemia but that appears to be dietary as the patient was brought soda and snacks from home.     Objective: Vital signs in last 24 hours: Filed Vitals:   05/27/14 2128 05/28/14 0209 05/28/14 0612 05/28/14 0946  BP: 127/76 107/69 115/71 110/68  Pulse: 85 78 74 75  Temp: 98.3 F (36.8 C) 98.1 F (36.7 C) 98.3 F (36.8 C) 98.4 F (36.9 C)  TempSrc: Oral   Oral  Resp: 19 18 18 18   Height:      Weight:      SpO2: 97% 97% 99% 98%   Weight change:   Intake/Output Summary (Last 24 hours) at 05/28/14 1200 Last data filed at 05/28/14 0941  Gross per 24 hour  Intake   1320 ml  Output   2800 ml  Net  -1480 ml   General: resting in bed in NAD HEENT: + white plaque on tonge resolving, no plaques on oropharynx or tonsils, only 1 necrotic looking tooth at the upper left of mouth, no oral ulcers, PERRL Cardiac: RRR, no rubs, murmurs or gallops Pulm: some upper airway noise, CTAB, no crackles or wheezes, moving normal volumes of air Abd: soft, nontender, nondistended, BS present Ext: warm and well perfused, no pedal edema Neuro: alert and oriented X3, responding appropriately, able to move extremities voluntarily  Lab Results: Basic Metabolic Panel:  Recent Labs Lab 05/27/14 1543 05/28/14 0430  NA 132* 137  K 3.9 4.1  CL 95* 101  CO2 22 24  GLUCOSE 275* 268*  BUN 14 11  CREATININE 0.75 0.90  CALCIUM 9.3 9.7   Liver Function Tests:  Recent Labs Lab 05/25/14 0955  AST 19  ALT 27  ALKPHOS 248*  BILITOT 0.5  PROT 9.6*  ALBUMIN 4.6   CBC:  Recent Labs Lab 05/25/14 0955  05/27/14 0349 05/28/14 0430  WBC 9.1  < > 9.3 7.9  NEUTROABS 6.7  --   --  2.8  HGB 16.5  < > 13.8 13.1  HCT 48.0  < > 39.7 37.4*  MCV 94.5  < > 90.8 91.4  PLT 380  < > 319 307  < > = values in this interval  not displayed. CBG:  Recent Labs Lab 05/27/14 0830 05/27/14 1209 05/27/14 1716 05/27/14 2155 05/27/14 2330 05/28/14 0811  GLUCAP 267* 231* 216* 452* 382* 251*   Hemoglobin A1C:  Recent Labs Lab 05/25/14 1953  HGBA1C 13.7*   Urinalysis:  Recent Labs Lab 05/25/14 1234  COLORURINE STRAW*  LABSPEC 1.036*  PHURINE 6.0  GLUCOSEU >1000*  HGBUR MODERATE*  BILIRUBINUR NEGATIVE  KETONESUR 15*  PROTEINUR 30*  UROBILINOGEN 0.2  NITRITE NEGATIVE  LEUKOCYTESUR NEGATIVE   Micro Results: Recent Results (from the past 240 hour(s))  MRSA PCR SCREENING     Status: None   Collection Time    05/25/14  9:12 PM      Result Value Ref Range Status   MRSA by PCR NEGATIVE  NEGATIVE Final   Comment:            The GeneXpert MRSA Assay (FDA     approved for NASAL specimens     only), is one component of a     comprehensive MRSA colonization     surveillance program. It is not     intended to diagnose MRSA  infection nor to guide or     monitor treatment for     MRSA infections.   Studies/Results: Dg Orthopantogram  05/27/2014   CLINICAL DATA:  Left jaw pain and swelling.  Upper left loose teeth.  EXAM: ORTHOPANTOGRAM/PANORAMIC  COMPARISON:  Head CT 01/04/2008  FINDINGS: Poor dentition. No acute fracture or dislocation. No periapical lucencies identified. No osseous destruction. Lucency about the low left side of the maxilla is favored to be artifactual.  IMPRESSION: No convincing evidence of acute osseous finding or explanation for left-sided jaw pain/swelling. Apparent relative lucency about the left side of the maxilla is favored to be artifactual. CT may be informative.   Electronically Signed   By: Abigail Miyamoto M.D.   On: 05/27/2014 11:08   Ct Maxillofacial W/cm  05/27/2014   CLINICAL DATA:  Left jaw pain, left facial swelling, and poor dentition. A newly diagnosed diabetes.  EXAM: CT MAXILLOFACIAL WITH CONTRAST  TECHNIQUE: Multidetector CT imaging of the maxillofacial  structures was performed with intravenous contrast. Multiplanar CT image reconstructions were also generated. A small metallic BB was placed on the right temple in order to reliably differentiate right from left.  CONTRAST:  90mL OMNIPAQUE IOHEXOL 300 MG/ML  SOLN  COMPARISON:  Panorex 05/27/2014 and CT 01/04/2008  FINDINGS: The visualized portion of the brain is unremarkable. Orbits are unremarkable. Small right maxillary sinus mucous retention cyst is noted. There is minimal left maxillary sinus and posterior left ethmoid air cell mucosal thickening. Mastoid air cells are clear.  There is prominent periapical lucency involving the left maxillary first premolar. There is some surrounding low density in the adjacent soft tissues, however no organized, drainable fluid collection is seen. Soft tissue thickening/inflammatory changes are present overlying this tooth with diffuse fat reticulation present in the left face extending superiorly to the inferior aspect of the orbit. Mild periapical lucency is noted involving the maxillary right central incisor as well as maxillary right first premolar.  Nasopharynx and oropharynx are unremarkable. Mildly enlarged bilateral submandibular lymph nodes measure up to 1.2 cm in short axis on the right and 1.1 cm on the left, likely reactive. Enlarged level II lymph nodes measure up to 1.1 cm on the right. Parotid and submandibular glands are unremarkable. Mild-to-moderate disc space narrowing and endplate osteophyte formation are noted at C4-5 and C5-6.  IMPRESSION: Inflammatory changes throughout the soft tissues of the left face, likely odontogenic in origin arising from the left maxillary first premolar. No organized, drainable fluid collection identified.   Electronically Signed   By: Logan Bores   On: 05/27/2014 14:09   Medications: I have reviewed the patient's current medications. Scheduled Meds: . allopurinol  100 mg Oral Daily  . diltiazem  120 mg Oral Daily  .  fluconazole  100 mg Oral Daily  . heparin  5,000 Units Subcutaneous 3 times per day  . insulin aspart  0-9 Units Subcutaneous TID WC  . [START ON 05/29/2014] insulin glargine  15 Units Subcutaneous Daily  . magic mouthwash  2 mL Oral TID   Continuous Infusions:   PRN Meds:.acetaminophen, dextrose, magic mouthwash  Assessment/Plan: 53 yo man with hx of HTN and gout presenting with hyperglycemia.   Left maxillofacial swelling likely 2/2 odontogenic infection:  Patient with poor dentition and says he will need his teeth removed.  He has never had swelling before.  He denies throat swelling or difficulty breathing.  Afebrile, normal WBC.  Orthopantogram showed no convincing evidence of acute osseous findings; apparent relative lucency  about the left side of the maxilla is favored to be artifactual. Therefore a CT maxillofacial to r/o abscess was performed that was only postive for inflammation and no abscess.  -pt was started on Unasyn on 5/29 and then transitioned to oral augmentin 500 tid for 5 days and then dental referral to be provided by PCP at d/c  - magic mouthwash -anticipate d/c on 5/31 with close f/u outpt   Odynophagia (?esophageal candidiasis):  Patient reports painful swallowing (for solids) for the past month.  He has white patches at the back of his tongue.  He says he has lost significant weight.  Looking back in the chart he was 38 pounds heavier at clinic visit 1 month ago.   HIV negative this admission. - soft (carb mod) diet - diflucan - magic mouthwash - consider endoscopy if no improvement with diflucan after a few days  Hyperglycemia hyperosmolar nonketotic state vs early DKA- resolved with residual hyperglycemia 2/2 diet:  The patient presented with severe hyperglycemia. His mental status was clear. He had an elevated AG, but no significant acidosis (pH 7.3, AG 23, bicarb 23) making DKA unlikely.  However, he complains of abdominal pain and nausea and vomiting. This  appears to be 2/2 new onset DM.  Hgb A1c 13.7 (this admission).  Off insulin gtt for past 24 hours.  CBGs 230s-460 in the past 24 hours. - Lantus 12 may  Need to increase to 15 or 20 -increased SSI to moderate -pt will need CBG reading teaching before can go home - continue soft carb modified diet (patient complaining of odynophagia for past month)  HTN:  stable - continue home diltiazem - monitor BP   Gout:  stable  - continue home allopurinol  Diet:  Carb mod (soft) VTE ppx: Heparin [place] Code:  full   Dispo: Disposition is deferred at this time, awaiting improvement of current medical problems.  Anticipated discharge in approximately 2-3 day(s).   The patient does have a current PCP Clinton Gallant, MD) and does need an [facility] hospital follow-up appointment after discharge.  The patient does not know have transportation limitations that hinder transportation to clinic appointments.  .Services Needed at time of discharge: Y = Yes, Blank = No PT:   OT:   RN:   Equipment:   Other:     LOS: 3 days   Clinton Gallant, MD 05/28/2014, 12:00 PM Attending physician note: I examined this patient together with resident physician Dr. Clinton Gallant and I agree with her evaluation and management plan outlined above. Blood sugars are improving but additional adjustments in his insulin need to be made and are in progress. Murriel Hopper, MD, [place]  Hematology-Oncology/Internal Medicine

## 2014-05-29 DIAGNOSIS — E119 Type 2 diabetes mellitus without complications: Secondary | ICD-10-CM

## 2014-05-29 DIAGNOSIS — B3781 Candidal esophagitis: Secondary | ICD-10-CM

## 2014-05-29 LAB — GLUCOSE, CAPILLARY
Glucose-Capillary: 245 mg/dL — ABNORMAL HIGH (ref 70–99)
Glucose-Capillary: 236 mg/dL — ABNORMAL HIGH (ref 70–99)

## 2014-05-29 LAB — BASIC METABOLIC PANEL
BUN: 12 mg/dL (ref 6–23)
CO2: 22 mEq/L (ref 19–32)
Calcium: 9.6 mg/dL (ref 8.4–10.5)
Chloride: 100 mEq/L (ref 96–112)
Creatinine, Ser: 0.81 mg/dL (ref 0.50–1.35)
Glucose, Bld: 286 mg/dL — ABNORMAL HIGH (ref 70–99)
POTASSIUM: 4.7 meq/L (ref 3.7–5.3)
Sodium: 135 mEq/L — ABNORMAL LOW (ref 137–147)

## 2014-05-29 MED ORDER — AMOXICILLIN-POT CLAVULANATE 500-125 MG PO TABS: 1.0000 | ORAL_TABLET | Freq: Three times a day (TID) | ORAL | Status: AC

## 2014-05-29 MED ORDER — INSULIN GLARGINE 100 UNIT/ML ~~LOC~~ SOLN: 15.0000 [IU] | Freq: Every day | SUBCUTANEOUS | Status: DC

## 2014-05-29 MED ORDER — GLUCOSE BLOOD VI STRP: ORAL_STRIP | Status: AC

## 2014-05-29 MED ORDER — FLUCONAZOLE 100 MG PO TABS: 100.0000 mg | ORAL_TABLET | Freq: Every day | ORAL | Status: AC

## 2014-05-29 MED ORDER — RELION CONFIRM GLUCOSE MONITOR W/DEVICE KIT: 1.0000 [IU] | PACK | Freq: Once | Status: AC

## 2014-05-29 MED ORDER — "INSULIN SYRINGE-NEEDLE U-100 31G X 5/16"" 1 ML MISC": 1.0000 | Freq: Every day | Status: DC

## 2014-05-29 NOTE — Discharge Summary (Signed)
Patient Name:  [name]  MRN: [id number]  PCP: Clinton Gallant, MD  DOB:  1961/06/16       Date of Admission:  05/25/2014  Date of Discharge:  05/29/2014      Attending Physician: Dr. Annia Belt, MD         DISCHARGE DIAGNOSES: 1.   New-onset type 2 diabetes mellitus with hyperosmolar nonketotic hyperglycemia 2.   Odontogenic infection 3.   Esophageal candidiasis   DISPOSITION AND FOLLOW-UP: [name] is to follow-up with the listed providers as detailed below, at patient's visiting, please address following issues:  1) control of blood glucose on current insulin dose; he will likely need increase or additional agent 2) compliance with Diflucan, resolution of odynophagia/thrush; he will need referral for endoscopy if symptoms not improving 3) compliance with antibiotic; he will need to be referred to a dentist  Follow-up Information   Follow up with Clinton Gallant, MD. (clinic will call you with your appt date)    Specialty:  Internal Medicine   Contact information:   [place] [address] [phone]      Discharge Instructions   Call MD for:  difficulty breathing, headache or visual disturbances    Complete by:  As directed      Call MD for:  extreme fatigue    Complete by:  As directed      Call MD for:  hives    Complete by:  As directed      Call MD for:  persistant dizziness or light-headedness    Complete by:  As directed      Call MD for:  persistant nausea and vomiting    Complete by:  As directed      Call MD for:  severe uncontrolled pain    Complete by:  As directed      Call MD for:  temperature >100.4    Complete by:  As directed      Diet - low sodium heart healthy    Complete by:  As directed      Increase activity slowly    Complete by:  As directed             DISCHARGE MEDICATIONS:   Medication List         albuterol 108 (90 BASE) MCG/ACT inhaler  Commonly known as:  PROVENTIL HFA;VENTOLIN HFA  Inhale 2  puffs into the lungs every 6 (six) hours as needed for wheezing.     allopurinol 100 MG tablet  Commonly known as:  ZYLOPRIM  Take 1 tablet (100 mg total) by mouth daily.     amoxicillin-clavulanate 500-125 MG per tablet  Commonly known as:  AUGMENTIN  Take 1 tablet (500 mg total) by mouth 3 (three) times daily.     BIOFREEZE EX  Apply 1 application topically 2 (two) times daily as needed (pain).     diltiazem 120 MG 24 hr capsule  Commonly known as:  TIAZAC  Take 1 capsule (120 mg total) by mouth daily.     fluconazole 100 MG tablet  Commonly known as:  DIFLUCAN  Take 1 tablet (100 mg total) by mouth daily.  Start taking on:  05/30/2014     indomethacin 50 MG capsule  Commonly known as:  INDOCIN  Take 1 capsule (50 mg total) by mouth 3 (three) times daily with meals.     insulin glargine 100 UNIT/ML injection  Commonly known as:  LANTUS  Inject  0.15 mLs (15 Units total) into the skin daily.  Start taking on:  05/30/2014     loratadine 10 MG tablet  Commonly known as:  CLARITIN  Take 1 tablet (10 mg total) by mouth daily.         CONSULTS:    None    PROCEDURES PERFORMED:  Dg Orthopantogram  05/27/2014   CLINICAL DATA:  Left jaw pain and swelling.  Upper left loose teeth.  EXAM: ORTHOPANTOGRAM/PANORAMIC  COMPARISON:  Head CT 01/04/2008  FINDINGS: Poor dentition. No acute fracture or dislocation. No periapical lucencies identified. No osseous destruction. Lucency about the low left side of the maxilla is favored to be artifactual.  IMPRESSION: No convincing evidence of acute osseous finding or explanation for left-sided jaw pain/swelling. Apparent relative lucency about the left side of the maxilla is favored to be artifactual. CT may be informative.   Electronically Signed   By: Abigail Miyamoto M.D.   On: 05/27/2014 11:08   Ct Maxillofacial W/cm  05/27/2014   CLINICAL DATA:  Left jaw pain, left facial swelling, and poor dentition. A newly diagnosed diabetes.  EXAM: CT  MAXILLOFACIAL WITH CONTRAST  TECHNIQUE: Multidetector CT imaging of the maxillofacial structures was performed with intravenous contrast. Multiplanar CT image reconstructions were also generated. A small metallic BB was placed on the right temple in order to reliably differentiate right from left.  CONTRAST:  41mL OMNIPAQUE IOHEXOL 300 MG/ML  SOLN  COMPARISON:  Panorex 05/27/2014 and CT 01/04/2008  FINDINGS: The visualized portion of the brain is unremarkable. Orbits are unremarkable. Small right maxillary sinus mucous retention cyst is noted. There is minimal left maxillary sinus and posterior left ethmoid air cell mucosal thickening. Mastoid air cells are clear.  There is prominent periapical lucency involving the left maxillary first premolar. There is some surrounding low density in the adjacent soft tissues, however no organized, drainable fluid collection is seen. Soft tissue thickening/inflammatory changes are present overlying this tooth with diffuse fat reticulation present in the left face extending superiorly to the inferior aspect of the orbit. Mild periapical lucency is noted involving the maxillary right central incisor as well as maxillary right first premolar.  Nasopharynx and oropharynx are unremarkable. Mildly enlarged bilateral submandibular lymph nodes measure up to 1.2 cm in short axis on the right and 1.1 cm on the left, likely reactive. Enlarged level II lymph nodes measure up to 1.1 cm on the right. Parotid and submandibular glands are unremarkable. Mild-to-moderate disc space narrowing and endplate osteophyte formation are noted at C4-5 and C5-6.  IMPRESSION: Inflammatory changes throughout the soft tissues of the left face, likely odontogenic in origin arising from the left maxillary first premolar. No organized, drainable fluid collection identified.   Electronically Signed   By: Logan Bores   On: 05/27/2014 14:09   Dg Chest Portable 1 View  05/25/2014   CLINICAL DATA:  Chest  tightness.  EXAM: PORTABLE CHEST - 1 VIEW  COMPARISON:  01/04/2008  FINDINGS: The heart size and mediastinal contours are within normal limits. Both lungs are clear. The visualized skeletal structures are unremarkable.  IMPRESSION: No active disease.   Electronically Signed   By: Lajean Manes M.D.   On: 05/25/2014 11:40       ADMISSION DATA: HPI: [name] is a 53 y.o. man who presents with a cc of progressive weakness. Over the last few weeks the patient has been feeling poorly. He has noticed increased thirst and urine output. He has also lost 30 pounds  over the last few months. It has progressed to the point where he cannot perform his job. He denies somnolence, tremors. He admits to N/V over the last 24-48 hours. He also complains of vague abdominal discomfort. BMP revealed a glucose of 1300. The patient was admitted for management of his hyperglycemia.  Physical Exam: Blood pressure 135/91, pulse 76, temperature 97 F (36.1 C), temperature source Axillary, resp. rate 16, height 5\' 11"  (1.803 m), weight 201 lb (91.173 kg), SpO2 93.00%.  Physical Exam  Constitutional: He is oriented to person, place, and time. He appears well-developed and well-nourished.  HENT:  Head: Normocephalic and atraumatic.  Dry mucous membranes  Cardiovascular: Normal rate, regular rhythm, normal heart sounds and intact distal pulses. Exam reveals no friction rub.  No murmur heard.  Pulmonary/Chest: Effort normal and breath sounds normal. No respiratory distress. He has no wheezes. He has no rales.  Abdominal: Soft. Bowel sounds are normal. He exhibits no distension. There is tenderness. There is no rebound and no guarding.  Mild abdominal tenderness.  Neurological: He is alert and oriented to person, place, and time. No cranial nerve deficit. Coordination normal.  Skin: He is not diaphoretic.  Psychiatric: He has a normal mood and affect. His behavior is normal  Labs: Basic Metabolic Panel:   Recent Labs    05/25/14 0955   NA  128*   K  5.4*   CL  82*   CO2  23   GLUCOSE  1321*   BUN  26*   CREATININE  1.18   CALCIUM  10.9*    Liver Function Tests:   Recent Labs   05/25/14 0955   AST  19   ALT  27   ALKPHOS  248*   BILITOT  0.5   PROT  9.6*   ALBUMIN  4.6    CBC:   Recent Labs   05/25/14 0955   WBC  9.1   NEUTROABS  6.7   HGB  16.5   HCT  48.0   MCV  94.5   PLT  380    CBG:   Recent Labs   05/25/14 1238  05/25/14 1350  05/25/14 1455  05/25/14 1617  05/25/14 1619  05/25/14 1620   GLUCAP  >600*  >600*  598*  479*  >600*  442*    Urinalysis:   Recent Labs   05/25/14 1234   COLORURINE  STRAW*   LABSPEC  1.036*   PHURINE  6.0   GLUCOSEU  >1000*   HGBUR  MODERATE*   BILIRUBINUR  NEGATIVE   KETONESUR  15*   PROTEINUR  30*   UROBILINOGEN  0.2   NITRITE  NEGATIVE   LEUKOCYTESUR  NEGATIVE     HOSPITAL COURSE: Hyperglycemia hyperosmolar nonketotic state in newly diagnosed diabetic: Despite his marked hyperglycemia, nausea and abdominal pain, the patient had a normal pH with mild AG of 17 making DKA less likely.  His HgbA1c 3 months ago was 6.0.  HgbA1c this admission is 13.7 and his hyperglycemia state is due to new onset diabetes mellitus.  He was started on an insulin drip and electrolytes and AG monitored every two hours until resolution of HHS.  He was then started on a diet and transitioned to subcutaneous insulin 9 units and SSI sensitive.  Insulin was titrated up to 15 units daily by day of discharge.  He was provided with diabetes education and instructed on insulin injection.  He was instructed to check CBGs several times per day  and bring log to [facility] follow-up so that appropriate adjustments in insulin regimen can be made.  He will follow-up in [facility] next week.  Left maxillofacial swelling secondary to odontogenic infection:  The patient has poor dentition and says he will need his teeth removed.  He developed new left facial swelling on hospital day  #2.  He denied throat swelling or difficulty breathing.  He remained afebrile and had a normal WBC.  Orthopantogram showed no convincing evidence of acute osseous findings.  Maxillofacial CT showed inflammation but revealed no abscess.  He was started on IV Unasyn and his facial swelling resolved the following day.  IV antibiotic was transitioned to po Augmentin which was continued at discharge for 7 days total treatment.  He will be referred to a dentist at his hospital follow-up in [facility].  Odynophagia secondary to esophageal candidiasis:  The patient reports painful swallowing (for solids) for the past month. He has white patches at the back of his tongue. He says he has lost significant weight. Looking back in the chart he was 38 pounds heavier at clinic visit 1 month ago. HIV negative this admission.  This is likely secondary to uncontrolled diabetes.  Diflucan was started, he tolerated a soft diet and he had some improvement in pain.  He will need to continue Diflucan for 2-3 weeks after discharge.  He will need referral for endoscopy if he does not improve with Diflucan.  DISCHARGE DATA: Vital Signs: BP 91/59  Pulse 70  Temp(Src) 97.9 F (36.6 C) (Oral)  Resp 16  Ht 5\' 11"  (1.803 m)  Wt 91.173 kg (201 lb)  BMI 28.05 kg/m2  SpO2 99%  Labs: Results for orders placed during the hospital encounter of 05/25/14 (from the past 24 hour(s))  GLUCOSE, CAPILLARY     Status: Abnormal   Collection Time    05/28/14  5:02 PM      Result Value Ref Range   Glucose-Capillary 285 (*) 70 - 99 mg/dL  GLUCOSE, CAPILLARY     Status: Abnormal   Collection Time    05/28/14  9:44 PM      Result Value Ref Range   Glucose-Capillary 225 (*) 70 - 99 mg/dL  BASIC METABOLIC PANEL     Status: Abnormal   Collection Time    05/29/14  6:25 AM      Result Value Ref Range   Sodium 135 (*) 137 - 147 mEq/L   Potassium 4.7  3.7 - 5.3 mEq/L   Chloride 100  96 - 112 mEq/L   CO2 22  19 - 32 mEq/L   Glucose, Bld 286 (*)  70 - 99 mg/dL   BUN 12  6 - 23 mg/dL   Creatinine, Ser 0.81  0.50 - 1.35 mg/dL   Calcium 9.6  8.4 - 10.5 mg/dL   GFR calc non Af Amer >90  >90 mL/min   GFR calc Af Amer >90  >90 mL/min  GLUCOSE, CAPILLARY     Status: Abnormal   Collection Time    05/29/14  7:49 AM      Result Value Ref Range   Glucose-Capillary 245 (*) 70 - 99 mg/dL  GLUCOSE, CAPILLARY     Status: Abnormal   Collection Time    05/29/14 12:00 PM      Result Value Ref Range   Glucose-Capillary 236 (*) 70 - 99 mg/dL     Services Ordered on Discharge: Y = Yes; Blank = No PT:   OT:   RN:  Equipment:   Other:      Time Spent on Discharge: 35 min   Signed: Duwaine Maxin PGY 1, Internal Medicine Resident 05/29/2014, 2:18 PM   Attending Physician discharge note: I personally examined this patient on the day of discharge and I agree with the evaluation and discharge plan as recorded above by resident physician Dr Duwaine Maxin.  Murriel Hopper, MD, [place]  Hematology-Oncology/Internal Medicine

## 2014-05-29 NOTE — Discharge Instructions (Signed)
1. The clinic will call you with your appointment date and time.  Please be sure to come to your appointment.  You will get a referral for a dental appointment at that visit.      2. Please take all medications as prescribed.  Take 15 units of Lantus every day around the same time of day.  Check you blood sugar 3-4 times per day and bring your meter to your clinic appointment.    3. If you have worsening of your symptoms or new symptoms arise, please call the clinic ([phone]), or go to the ER immediately if symptoms are severe.

## 2014-05-29 NOTE — Progress Notes (Signed)
Subjective: No acute events overnight.  He was seen and examined this AM and feels well.  His facial swelling resolved and his odynophagia is improving with treatment.  He is tolerating diet and plans to eat breakfast when it arrives.  He feels ready to go home.    Objective: Vital signs in last 24 hours: Filed Vitals:   05/28/14 0946 05/28/14 1427 05/28/14 2148 05/29/14 0530  BP: 110/68 114/71 120/79 115/77  Pulse: 75 81 77 80  Temp: 98.4 F (36.9 C) 97.9 F (36.6 C) 97.6 F (36.4 C) 97.6 F (36.4 C)  TempSrc: Oral Oral Oral Oral  Resp: 18 18 16 17   Height:      Weight:      SpO2: 98% 99% 100% 100%   Weight change:   Intake/Output Summary (Last 24 hours) at 05/29/14 0850 Last data filed at 05/29/14 0500  Gross per 24 hour  Intake    840 ml  Output    300 ml  Net    540 ml   General: resting in bed in NAD HEENT: + white plaque on tonge resolving, no plaques on oropharynx or tonsils, facial swelling resolved Cardiac: RRR, no rubs, murmurs or gallops Pulm: CTA B/L, no crackles or wheezes, moving normal volumes of air Abd: soft, nontender, nondistended, BS present Ext: warm and well perfused, no pedal edema Neuro: alert and oriented X3, responding appropriately  Lab Results: Basic Metabolic Panel:  Recent Labs Lab 05/28/14 0430 05/29/14 0625  NA 137 135*  K 4.1 4.7  CL 101 100  CO2 24 22  GLUCOSE 268* 286*  BUN 11 12  CREATININE 0.90 0.81  CALCIUM 9.7 9.6   CBC:  Recent Labs Lab 05/25/14 0955  05/27/14 0349 05/28/14 0430  WBC 9.1  < > 9.3 7.9  NEUTROABS 6.7  --   --  2.8  HGB 16.5  < > 13.8 13.1  HCT 48.0  < > 39.7 37.4*  MCV 94.5  < > 90.8 91.4  PLT 380  < > 319 307  < > = values in this interval not displayed. CBG:  Recent Labs Lab 05/27/14 2330 05/28/14 0811 05/28/14 1249 05/28/14 1702 05/28/14 2144 05/29/14 0749  GLUCAP 382* 251* 303* 285* 225* 245*   Hemoglobin A1C:  Recent Labs Lab 05/25/14 1953  HGBA1C 13.7*    Urinalysis:  Recent Labs Lab 05/25/14 1234  COLORURINE STRAW*  LABSPEC 1.036*  PHURINE 6.0  GLUCOSEU >1000*  HGBUR MODERATE*  BILIRUBINUR NEGATIVE  KETONESUR 15*  PROTEINUR 30*  UROBILINOGEN 0.2  NITRITE NEGATIVE  LEUKOCYTESUR NEGATIVE   Micro Results: Recent Results (from the past 240 hour(s))  MRSA PCR SCREENING     Status: None   Collection Time    05/25/14  9:12 PM      Result Value Ref Range Status   MRSA by PCR NEGATIVE  NEGATIVE Final   Comment:            The GeneXpert MRSA Assay (FDA     approved for NASAL specimens     only), is one component of a     comprehensive MRSA colonization     surveillance program. It is not     intended to diagnose MRSA     infection nor to guide or     monitor treatment for     MRSA infections.  CULTURE, BLOOD (ROUTINE X 2)     Status: None   Collection Time    05/27/14  3:45 PM  Result Value Ref Range Status   Specimen Description BLOOD LEFT ARM   Final   Special Requests BOTTLES DRAWN AEROBIC AND ANAEROBIC 10CC   Final   Culture  Setup Time     Final   Value: 05/27/2014 21:41     Performed at [facility]   Culture     Final   Value:        BLOOD CULTURE RECEIVED NO GROWTH TO DATE CULTURE WILL BE HELD FOR 5 DAYS BEFORE ISSUING A FINAL NEGATIVE REPORT     Performed at [facility]   Report Status PENDING   Incomplete  CULTURE, BLOOD (ROUTINE X 2)     Status: None   Collection Time    05/27/14  3:56 PM      Result Value Ref Range Status   Specimen Description BLOOD RIGHT ARM   Final   Special Requests BOTTLES DRAWN AEROBIC AND ANAEROBIC 10CC   Final   Culture  Setup Time     Final   Value: 05/27/2014 21:40     Performed at [facility]   Culture     Final   Value:        BLOOD CULTURE RECEIVED NO GROWTH TO DATE CULTURE WILL BE HELD FOR 5 DAYS BEFORE ISSUING A FINAL NEGATIVE REPORT     Performed at [facility]   Report Status PENDING   Incomplete   Studies/Results: Dg  Orthopantogram  05/27/2014   CLINICAL DATA:  Left jaw pain and swelling.  Upper left loose teeth.  EXAM: ORTHOPANTOGRAM/PANORAMIC  COMPARISON:  Head CT 01/04/2008  FINDINGS: Poor dentition. No acute fracture or dislocation. No periapical lucencies identified. No osseous destruction. Lucency about the low left side of the maxilla is favored to be artifactual.  IMPRESSION: No convincing evidence of acute osseous finding or explanation for left-sided jaw pain/swelling. Apparent relative lucency about the left side of the maxilla is favored to be artifactual. CT may be informative.   Electronically Signed   By: Abigail Miyamoto M.D.   On: 05/27/2014 11:08   Ct Maxillofacial W/cm  05/27/2014   CLINICAL DATA:  Left jaw pain, left facial swelling, and poor dentition. A newly diagnosed diabetes.  EXAM: CT MAXILLOFACIAL WITH CONTRAST  TECHNIQUE: Multidetector CT imaging of the maxillofacial structures was performed with intravenous contrast. Multiplanar CT image reconstructions were also generated. A small metallic BB was placed on the right temple in order to reliably differentiate right from left.  CONTRAST:  43mL OMNIPAQUE IOHEXOL 300 MG/ML  SOLN  COMPARISON:  Panorex 05/27/2014 and CT 01/04/2008  FINDINGS: The visualized portion of the brain is unremarkable. Orbits are unremarkable. Small right maxillary sinus mucous retention cyst is noted. There is minimal left maxillary sinus and posterior left ethmoid air cell mucosal thickening. Mastoid air cells are clear.  There is prominent periapical lucency involving the left maxillary first premolar. There is some surrounding low density in the adjacent soft tissues, however no organized, drainable fluid collection is seen. Soft tissue thickening/inflammatory changes are present overlying this tooth with diffuse fat reticulation present in the left face extending superiorly to the inferior aspect of the orbit. Mild periapical lucency is noted involving the maxillary right  central incisor as well as maxillary right first premolar.  Nasopharynx and oropharynx are unremarkable. Mildly enlarged bilateral submandibular lymph nodes measure up to 1.2 cm in short axis on the right and 1.1 cm on the left, likely reactive. Enlarged level II lymph nodes measure up  to 1.1 cm on the right. Parotid and submandibular glands are unremarkable. Mild-to-moderate disc space narrowing and endplate osteophyte formation are noted at C4-5 and C5-6.  IMPRESSION: Inflammatory changes throughout the soft tissues of the left face, likely odontogenic in origin arising from the left maxillary first premolar. No organized, drainable fluid collection identified.   Electronically Signed   By: Logan Bores   On: 05/27/2014 14:09   Medications: I have reviewed the patient's current medications. Scheduled Meds: . allopurinol  100 mg Oral Daily  . amoxicillin-clavulanate  1 tablet Oral TID  . diltiazem  120 mg Oral Daily  . fluconazole  100 mg Oral Daily  . heparin  5,000 Units Subcutaneous 3 times per day  . insulin aspart  0-15 Units Subcutaneous TID WC  . insulin glargine  15 Units Subcutaneous Daily   Continuous Infusions:   PRN Meds:.acetaminophen, dextrose, magic mouthwash  Assessment/Plan: 53 yo man with hx of HTN and gout presenting with hyperglycemia.   Left maxillofacial swelling likely 2/2 odontogenic infection:  Patient with poor dentition and says he will need his teeth removed.  He has never had swelling before.  He denies throat swelling or difficulty breathing.  Afebrile, normal WBC.  Orthopantogram showed no convincing evidence of acute osseous findings; apparent relative lucency about the left side of the maxilla is favored to be artifactual. Therefore a CT maxillofacial to r/o abscess was performed that was only postive for inflammation and no abscess.  Patient's swelling has resolved. - stable for d/c home today - pt was started on Unasyn on 5/29 and then transitioned to oral  Augmentin, will continue at d/c for 7 days total antibiotic treatment - refer to dentist at discharge  Hyperglycemia hyperosmolar nonketotic state vs early DKA, new DM diagnosis - resolved with residual hyperglycemia 2/2 diet:  The patient presented with severe hyperglycemia. His mental status was clear. He had an elevated AG, but no significant acidosis (pH 7.3, AG 23, bicarb 23) making DKA unlikely.  However, he had complaints of abdominal pain and nausea and vomiting. This appears to be 2/2 new onset DM.  Hgb A1c 13.7 (this admission).  Off insulin gtt and transitioned to [facility].  CBGs 220s-300 in the past 24 hours (improved but not ideal).   - increase Lantus 12 units --> 15 units today and at d/c - instruct patient to check CBGs several times per day and bring log to [facility] follow-up so that appropriate adjustments in insulin regimen can be made - continue SSI moderate - patient can be d/c home today after appropriate insulin administration/CBG monitoring education - follow-up in [facility] next week  Odynophagia 2/2 esophageal candidiasis:  Patient reports painful swallowing (for solids) for the past month.  He has white patches at the back of his tongue.  He says he has lost significant weight.  Looking back in the chart he was 38 pounds heavier at clinic visit 1 month ago.   HIV negative this admission.  Today he says his pain is improving and he is tolerating his diet. - soft (carb mod) diet - continue diflucan at d/c for two weeks total - follow-up in [facility] next week - consider endoscopy if no improvement with diflucan   HTN:  stable - continue home diltiazem  Gout:  stable  - continue home allopurinol  Diet:  Carb mod (soft) VTE ppx: Heparin [place] Code:  Full   Dispo: The patient is feeling better and clinically stable for discharge home today.  He understands  the need to take insulin and watch diet to keep BG under control.  He also understands that he will need to follow-up in [facility] and with a  dentist regarding the problems above.  He is agreeable to follow-up which will be arranged.   The patient does have a current PCP Clinton Gallant, MD) and does need an [facility] hospital follow-up appointment after discharge.  The patient does not know have transportation limitations that hinder transportation to clinic appointments.  .Services Needed at time of discharge: Y = Yes, Blank = No PT:   OT:   RN:   Equipment:   Other:     LOS: 4 days   Duwaine Maxin, DO 05/29/2014, 8:50 AM

## 2014-05-30 ENCOUNTER — Other Ambulatory Visit: Payer: Self-pay | Admitting: Internal Medicine

## 2014-05-30 ENCOUNTER — Telehealth: Payer: Self-pay | Admitting: *Deleted

## 2014-05-30 MED ORDER — INSULIN DETEMIR 100 UNIT/ML ~~LOC~~ SOLN: 15.0000 [IU] | Freq: Every day | SUBCUTANEOUS | Status: DC

## 2014-05-30 NOTE — Telephone Encounter (Signed)
Call to [facility] for Prior Authorization for Lantus at [phone].  Denied Pt will need to try Levemir Insulin first which is covered by [employer].  Call to Dr. Redmond Pulling to ask for a change in Insulin.  Pt was informed of need to change Insulin.    Pt needs prescription sent to [facility] on [address].  Sander Nephew, RN 05/30/2014 1:30  PM

## 2014-06-02 LAB — CULTURE, BLOOD (ROUTINE X 2)
CULTURE: NO GROWTH
Culture: NO GROWTH

## 2014-06-03 ENCOUNTER — Ambulatory Visit: Payer: No Typology Code available for payment source | Admitting: Internal Medicine

## 2014-06-06 ENCOUNTER — Telehealth: Payer: Self-pay | Admitting: Dietician

## 2014-06-06 NOTE — Telephone Encounter (Signed)
Calling to assist with transition of care from hospital to home.  Discharge date:05-29-14 Call date: 06-06-14 Hospital follow up appointment date: 06-03-14  Mobile number: [phone] He says he was able to fill all the prescriptions given to him at discharge and that his blood sugars are " up and down". He did not know he missed his hospital follow up. He agreed to have front office call him to reschedule Other problems/concerns: patient missed his hospital follow up.

## 2014-06-09 ENCOUNTER — Ambulatory Visit (INDEPENDENT_AMBULATORY_CARE_PROVIDER_SITE_OTHER): Payer: No Typology Code available for payment source | Admitting: Internal Medicine

## 2014-06-09 ENCOUNTER — Encounter: Payer: Self-pay | Admitting: Internal Medicine

## 2014-06-09 VITALS — BP 108/63 | HR 73 | Temp 97.0°F | Ht 71.0 in | Wt 222.2 lb

## 2014-06-09 DIAGNOSIS — E119 Type 2 diabetes mellitus without complications: Secondary | ICD-10-CM

## 2014-06-09 DIAGNOSIS — Z8719 Personal history of other diseases of the digestive system: Secondary | ICD-10-CM

## 2014-06-09 LAB — GLUCOSE, CAPILLARY: Glucose-Capillary: 123 mg/dL — ABNORMAL HIGH (ref 70–99)

## 2014-06-09 MED ORDER — INSULIN GLARGINE 100 UNIT/ML ~~LOC~~ SOLN: 15.0000 [IU] | Freq: Every day | SUBCUTANEOUS | Status: DC

## 2014-06-09 MED ORDER — METFORMIN HCL 500 MG PO TABS: 500.0000 mg | ORAL_TABLET | Freq: Two times a day (BID) | ORAL | Status: DC

## 2014-06-09 NOTE — Progress Notes (Signed)
Subjective:    Patient ID: [name], male    DOB: [birth year], 53 y.o.   MRN: [id number]  HPI Mr. Hippert is a 53 yo man pmh as listed below here for HFU.   Pt was recnelty admitted with HHS and new onset DM. HgbA1c was 14 and pt started on lantus 15 units. He reports compliance and brings in his meter. His meter is unable to be down loaded today. But upon review it appears that his preprandial readings are anywhere between a 2- 300s and patient has not had any hypoglycemic readings. The patient reports feeling much better since starting insulin and is even having interval improvement of his vision. The patient denies any chest pain, shortest of breath, dizziness, sweating, jitteriness, lower extreme edema, or headaches.   The patient has been able to return back to work and is functioning well. In terms of his tooth pain it has completely resolved after completing antibiotics as well as his apparent aphasia with completion of the fluconazole. That he has recently quit smoking as of today's visit.  Past Medical History  Diagnosis Date  . Allergy   . Hypertension   . Gout    Current Outpatient Prescriptions on File Prior to Visit  Medication Sig Dispense Refill  . albuterol (PROVENTIL HFA;VENTOLIN HFA) 108 (90 BASE) MCG/ACT inhaler Inhale 2 puffs into the lungs every 6 (six) hours as needed for wheezing.  1 Inhaler  0  . allopurinol (ZYLOPRIM) 100 MG tablet Take 1 tablet (100 mg total) by mouth daily.  30 tablet  5  . Blood Glucose Monitoring Suppl (RELION CONFIRM GLUCOSE MONITOR) W/DEVICE KIT 1 Units by Does not apply route once.  1 kit  0  . diltiazem (TIAZAC) 120 MG 24 hr capsule Take 1 capsule (120 mg total) by mouth daily.  30 capsule  5  . fluconazole (DIFLUCAN) 100 MG tablet Take 1 tablet (100 mg total) by mouth daily.  17 tablet  0  . glucose blood (FREESTYLE TEST STRIPS) test strip Use as instructed  100 each  12  . indomethacin (INDOCIN) 50 MG capsule Take 1 capsule (50 mg  total) by mouth 3 (three) times daily with meals.  30 capsule  0  . Insulin Syringe-Needle U-100 31G X 5/16" 1 ML MISC 1 Container by Does not apply route daily.  100 each  5  . loratadine (CLARITIN) 10 MG tablet Take 1 tablet (10 mg total) by mouth daily.  30 tablet  3  . Menthol, Topical Analgesic, (BIOFREEZE EX) Apply 1 application topically 2 (two) times daily as needed (pain).       No current facility-administered medications on file prior to visit.   Social, surgical, family history reviewed with patient and updated in appropriate chart locations.   Review of Systems  History obtained from chart review and the patient General ROS: negative for - chills, fatigue, fever, weight gain or weight loss Cardiovascular ROS: no chest pain or dyspnea on exertion Gastrointestinal ROS: no abdominal pain, change in bowel habits, or black or bloody stools Genito-Urinary ROS: no dysuria, trouble voiding, or hematuria     Objective:   Physical Exam Filed Vitals:   06/09/14 1047  BP: 108/63  Pulse: 73  Temp: 97 F (36.1 C)   General: sitting in chair, nAD HEENT: PERRL, EOMI, no scleral icterus, no white plaques or oral lesions, MMM, no odontogenic findings of pus along gums, poor dentition Cardiac: RRR, no rubs, murmurs or gallops Pulm: clear to auscultation  bilaterally, moving normal volumes of air Abd: soft, nontender, nondistended, BS present Ext: warm and well perfused, no pedal edema Neuro: alert and oriented X3, cranial nerves II-XII grossly intact    Assessment & Plan:  Please see problem oriented charting  Pt discussed with Dr. Ellwood Dense

## 2014-06-09 NOTE — Patient Instructions (Addendum)
For your diabetes:  -take lantus 15 units at night -take the metformin 500 mg (pill) in the morning with breakfast and with dinner   -follow up in 1 month  General Instructions:   Thank you for bringing your medicines today. This helps [place] keep you safe from mistakes.   Progress Toward Treatment Goals:  No flowsheet data found.  Self Care Goals & Plans:  Self Care Goal 06/09/2014  Manage my medications take my medicines as prescribed; bring my medications to every visit; refill my medications on time  Eat healthy foods drink diet soda or water instead of juice or soda; eat more vegetables; eat foods that are low in salt; eat baked foods instead of fried foods; eat fruit for snacks and desserts  Stop smoking -    No flowsheet data found.   Care Management & Community Referrals:  Referral 08/31/2013  Referrals made to community resources smoking cessation

## 2014-06-09 NOTE — Assessment & Plan Note (Signed)
Lab Results  Component Value Date   HGBA1C 13.7* 05/25/2014   HGBA1C 6.0 03/11/2014     Assessment: Diabetes control:  Poor  Progress toward A1C goal:    Comments: new diagnosis   Plan: Medications:  continue current medications, add metformin 500mg  BID to lantus 15 units Home glucose monitoring: Frequency:   Timing:   Instruction/counseling given: reminded to get eye exam, reminded to bring blood glucose meter & log to each visit, reminded to bring medications to each visit, discussed foot care, discussed the need for weight loss and discussed diet Educational resources provided: brochure Self management tools provided: copy of home glucose meter download Other plans: will f/u in 1 month to titrate medications, refer to DM educator

## 2014-06-13 NOTE — Progress Notes (Signed)
Case discussed with Dr. Sadek at the time of the visit.  We reviewed the resident's history and exam and pertinent patient test results.  I agree with the assessment, diagnosis, and plan of care documented in the resident's note. 

## 2014-06-29 ENCOUNTER — Encounter: Payer: Self-pay | Admitting: Dietician

## 2014-06-29 ENCOUNTER — Ambulatory Visit (INDEPENDENT_AMBULATORY_CARE_PROVIDER_SITE_OTHER): Payer: No Typology Code available for payment source | Admitting: Dietician

## 2014-06-29 VITALS — Ht 71.5 in | Wt 225.5 lb

## 2014-06-29 DIAGNOSIS — E119 Type 2 diabetes mellitus without complications: Secondary | ICD-10-CM

## 2014-06-29 NOTE — Patient Instructions (Addendum)
Dr. Algis Liming wanted to see you around July 11th.   You are doing a great job caring for your diabetes - activity and food intake wise.  I encourage eating consistent carbohydrate throughout the day to the best of your ability - which you seem to be doing well at this point!  Butch Penny [phone]

## 2014-06-29 NOTE — Progress Notes (Signed)
Diabetes Self-Management Education  Visit Number: First/Initial  06/29/2014 [name], identified by name and date of birth, is a 53 y.o. male with Diabetes Type: Type 2.  Other people present during visit:  Spouse/SO   ASSESSMENT  Patient Concerns:  Nutrition/Meal planning;Medication;Monitoring;Healthy Lifestyle;Glycemic Control  Height 5' 11.5" (1.816 m), weight 225 lb 8 oz (102.286 kg). Body mass index is 31.02 kg/(m^2).  Lab Results: LDL Cholesterol  Date Value Ref Range Status  01/20/2008 109* 0-99 mg/dL Final     See lab report for associated comment(s)     Hemoglobin A1C  Date Value Ref Range Status  05/25/2014 13.7* <5.7 % Final     (NOTE)                                                                            03/11/2014 6.0   Final     Family History  Problem Relation Age of Onset  . Heart disease Mother   . Diabetes Mother   . Liver disease Father   . Diabetes Sister   . Colon polyps Neg Hx   . Colon cancer Neg Hx   . Esophageal cancer Neg Hx   . Stomach cancer Neg Hx    History  Substance Use Topics  . Smoking status: Current Every Day Smoker -- 0.40 packs/day for 32 years    Types: Cigarettes  . Smokeless tobacco: Former Systems developer     Comment: PATIENT STATES HE HAS CUT BACK SOME.  Quit  . Alcohol Use: 21.6 oz/week    36 Cans of beer per week    Support Systems:  Spouse;Friends;family members - sister has daughter with diabetes, several siblings have diabetes Special Needs:  None  Prior DM Education:  none formally Daily Foot Exams:  sometimes Patient Belief / Attitude about Diabetes:  Diabetes can be controlled  Assessment comments: patient is doing well, has a lot of family support. Offered diabetes classes which they declined for now. Self rated confidence in self managing his diabetes a "5" today.   Diet Recall: eggs, grits, toast or a few fruits for  Breakfast, watermelon and [place] and cheese for lunch, Dinner is baked or grilled meat- often  fish or poultry like salmon, black beans, vegetables like greens or cabbage or okra, zero calorie drinks, peanut butter and banana sandwich and diet drink for snack at night    Individualized Plan for Diabetes Self-Management Training:  Patient individualized diabetes plan discussed today with patient and includes: what is Diabetes, Nutrition, medications, monitoring, physical activity, how to handle highs and lows, Special care for my body when I have diabetes, Dealing daily with diabetes  Education Topics Reviewed with Patient Today:  Topic Points Discussed  Disease State Factors that contribute to the development of diabetes  Nutrition Management Role of diet in the treatment of diabetes and the relationship between the three main macronutrients and blood glucose level;Meal timing in regards to the patients' current diabetes medication.  Physical Activity and Exercise    Medications Taught/evaluated insulin injection, site rotation, insulin storage and needle disposal.;Reviewed patients medication for diabetes, action, purpose, timing of dose and side effects.  Monitoring Daily foot exams;Yearly dilated eye exam  Acute Complications  Chronic Complications Dental care  Psychosocial Adjustment Helped patient identify a support system for diabetes management  Goal Setting    Preconception Care (if applicable)      PATIENTS GOALS   Learning Objective(s):     Goal The patient agrees to:  Nutrition Follow meal plan discussed- consistent carb throughout the day  Physical Activity    Medications    Monitoring    Problem Solving    Reducing Risk do foot checks daily  Health Coping     Patient Self-Evaluation of Goals (Subsequent Visits)  Goal The patient rates self as meeting goals (% of time)  Nutrition    Physical Activity    Medications    Monitoring    Problem Solving    Reducing Risk    Health Coping       PERSONALIZED PLAN / SUPPORT  Self-Management Support:     ______________________________________________________________________  Outcomes  Expected Outcomes:  Demonstrated interest in learning. Expect positive outcomes Self-Care Barriers:  None Education material provided: yes If problems or questions, patient to contact team via:  Phone Time in: 1030     Time out: 1130 Future DSME appointment: - 4-6 wks   Plyler, Butch Penny 06/29/2014 12:14 PM

## 2014-07-15 ENCOUNTER — Encounter: Payer: No Typology Code available for payment source | Admitting: Internal Medicine

## 2014-07-22 ENCOUNTER — Ambulatory Visit (INDEPENDENT_AMBULATORY_CARE_PROVIDER_SITE_OTHER): Payer: No Typology Code available for payment source | Admitting: Internal Medicine

## 2014-07-22 ENCOUNTER — Encounter: Payer: Self-pay | Admitting: Internal Medicine

## 2014-07-22 VITALS — BP 130/80 | HR 75 | Temp 97.5°F | Ht 71.0 in | Wt 225.3 lb

## 2014-07-22 DIAGNOSIS — E119 Type 2 diabetes mellitus without complications: Secondary | ICD-10-CM

## 2014-07-22 DIAGNOSIS — I10 Essential (primary) hypertension: Secondary | ICD-10-CM

## 2014-07-22 LAB — HM DIABETES EYE EXAM

## 2014-07-22 MED ORDER — INSULIN DETEMIR 100 UNIT/ML ~~LOC~~ SOLN: 15.0000 [IU] | Freq: Every day | SUBCUTANEOUS | Status: DC

## 2014-07-22 MED ORDER — METFORMIN HCL 500 MG PO TABS: 500.0000 mg | ORAL_TABLET | Freq: Two times a day (BID) | ORAL | Status: DC

## 2014-07-22 NOTE — Assessment & Plan Note (Signed)
Lab Results  Component Value Date   HGBA1C 13.7* 05/25/2014   HGBA1C 6.0 03/11/2014     Assessment: Diabetes control:   Progress toward A1C goal:    Comments: will recheck at next visit  Plan: Medications:  continue current medications, lantus 10 units qhs changed to levemir 10 units qhs and metformin 500mg  BID Home glucose monitoring: Frequency:   Timing:   Instruction/counseling given: reminded to get eye exam, reminded to bring blood glucose meter & log to each visit, reminded to bring medications to each visit, discussed foot care, discussed the need for weight loss and discussed diet Educational resources provided: brochure Self management tools provided:   Other plans: pt got lipid panel, eye scan, and microalbumin ordered

## 2014-07-22 NOTE — Patient Instructions (Signed)
General Instructions: Excellent work on your diabetes control!! We will just be doing some regular test for your diabetes today.   Thank you for bringing your medicines today. This helps [place] keep you safe from mistakes.   Progress Toward Treatment Goals:  No flowsheet data found.  Self Care Goals & Plans:  Self Care Goal 07/22/2014  Manage my medications take my medicines as prescribed; bring my medications to every visit; refill my medications on time  Eat healthy foods drink diet soda or water instead of juice or soda; eat more vegetables; eat foods that are low in salt; eat baked foods instead of fried foods; eat fruit for snacks and desserts  Stop smoking -    No flowsheet data found.   Care Management & Community Referrals:  Referral 08/31/2013  Referrals made to community resources smoking cessation

## 2014-07-22 NOTE — Assessment & Plan Note (Signed)
BP Readings from Last 3 Encounters:  07/22/14 130/80  06/09/14 108/63  05/29/14 91/59    Lab Results  Component Value Date   NA 135* 05/29/2014   K 4.7 05/29/2014   CREATININE 0.81 05/29/2014    Assessment: Blood pressure control:   Progress toward BP goal:    Comments: at goal   Plan: Medications:  cont diltiazem Educational resources provided: brochure Self management tools provided:   Other plans:

## 2014-07-22 NOTE — Progress Notes (Signed)
Attending physician note: Presenting problems, physical findings, medications, reviewed with resident physician Dr. Sol Blazing and I concur with her assessment and management plan.  This patient is known to me from recent hospitalization. He is a new onset diabetic. Glucose at presentation over 600. He has received education and instruction on insulin administration. His condition has stabilized at this time. [name], M.D., [place]

## 2014-07-22 NOTE — Progress Notes (Signed)
   Subjective:    Patient ID: [name], male    DOB: 1961/02/14, 53 y.o.   MRN: [id number]  HPI [name] is a 53 yo man pmh as listed below presents for DM recheck.   DM - Patient checking blood sugars 3 times daily, before breakfast and lunch and dinner. Pt brings in his log that shows average of 120 highest 200 and lowest of 45. Currently taking metformin 557m BID and lantus 10 units qhs. one hypoglycemic episodes since last visit. Pt has also been attending DM educator visits in regards to helping develop and maintain lifestyle changes. denies polyuria, polydipsia, nausea, vomiting, diarrhea.  does request refills today and would like to switch to levemir as insurance pays for it better.He has not been able to obtain dental care for much needed caries extractions.    Past Medical History  Diagnosis Date  . Allergy   . Hypertension   . Gout    Current Outpatient Prescriptions on File Prior to Visit  Medication Sig Dispense Refill  . albuterol (PROVENTIL HFA;VENTOLIN HFA) 108 (90 BASE) MCG/ACT inhaler Inhale 2 puffs into the lungs every 6 (six) hours as needed for wheezing.  1 Inhaler  0  . allopurinol (ZYLOPRIM) 100 MG tablet Take 1 tablet (100 mg total) by mouth daily.  30 tablet  5  . Blood Glucose Monitoring Suppl (RELION CONFIRM GLUCOSE MONITOR) W/DEVICE KIT 1 Units by Does not apply route once.  1 kit  0  . diltiazem (TIAZAC) 120 MG 24 hr capsule Take 1 capsule (120 mg total) by mouth daily.  30 capsule  5  . glucose blood (FREESTYLE TEST STRIPS) test strip Use as instructed  100 each  12  . indomethacin (INDOCIN) 50 MG capsule Take 1 capsule (50 mg total) by mouth 3 (three) times daily with meals.  30 capsule  0  . insulin glargine (LANTUS) 100 UNIT/ML injection Inject 0.15 mLs (15 Units total) into the skin at bedtime.  10 mL  3  . Insulin Syringe-Needle U-100 31G X 5/16" 1 ML MISC 1 Container by Does not apply route daily.  100 each  5  . loratadine (CLARITIN) 10 MG tablet  Take 1 tablet (10 mg total) by mouth daily.  30 tablet  3  . Menthol, Topical Analgesic, (BIOFREEZE EX) Apply 1 application topically 2 (two) times daily as needed (pain).       No current facility-administered medications on file prior to visit.   Social, surgical, family history reviewed with patient and updated in appropriate chart locations.   Review of Systems Full 13 pt ROS performed and pertitent positives listed in HPI    Objective:   Physical Exam Filed Vitals:   07/22/14 1427  BP: 130/80  Pulse: 75  Temp: 97.5 F (36.4 C)   General: sitting in chair, NAD HEENT: PERRL, EOMI, no scleral icterus Cardiac: RRR, no rubs, murmurs or gallops Pulm: clear to auscultation bilaterally, moving normal volumes of air Abd: soft, nontender, nondistended, BS present Ext: warm and well perfused, no pedal edema Neuro: alert and oriented X3, cranial nerves II-XII grossly intact    Assessment & Plan:  Please see problem oriented charting  Pt discussed with Dr. GBeryle Beams

## 2014-07-23 LAB — LIPID PANEL
Cholesterol: 188 mg/dL (ref 0–200)
HDL: 60 mg/dL (ref >39)
LDL CALC: 90 mg/dL (ref 0–99)
Total CHOL/HDL Ratio: 3.1 Ratio
Triglycerides: 191 mg/dL — ABNORMAL HIGH (ref <150)
VLDL: 38 mg/dL (ref 0–40)

## 2014-07-23 LAB — MICROALBUMIN / CREATININE URINE RATIO
Creatinine, Urine: 171.2 mg/dL
Microalb Creat Ratio: 462.6 mg/g — ABNORMAL HIGH (ref 0.0–30.0)
Microalb, Ur: 79.19 mg/dL — ABNORMAL HIGH (ref 0.00–1.89)

## 2014-07-25 LAB — GLUCOSE, CAPILLARY: GLUCOSE-CAPILLARY: 89 mg/dL (ref 70–99)

## 2014-08-01 ENCOUNTER — Ambulatory Visit: Payer: No Typology Code available for payment source | Admitting: Dietician

## 2014-08-10 ENCOUNTER — Other Ambulatory Visit: Payer: Self-pay | Admitting: *Deleted

## 2014-08-10 DIAGNOSIS — I10 Essential (primary) hypertension: Secondary | ICD-10-CM

## 2014-08-10 MED ORDER — DILTIAZEM HCL ER BEADS 120 MG PO CP24: 120.0000 mg | ORAL_CAPSULE | Freq: Every day | ORAL | Status: DC

## 2014-08-26 ENCOUNTER — Other Ambulatory Visit: Payer: Self-pay | Admitting: *Deleted

## 2014-08-26 DIAGNOSIS — J069 Acute upper respiratory infection, unspecified: Secondary | ICD-10-CM

## 2014-08-26 MED ORDER — LORATADINE 10 MG PO TABS: 10.0000 mg | ORAL_TABLET | Freq: Every day | ORAL | Status: DC

## 2014-09-13 ENCOUNTER — Other Ambulatory Visit: Payer: Self-pay | Admitting: Internal Medicine

## 2014-09-27 ENCOUNTER — Encounter: Payer: Self-pay | Admitting: *Deleted

## 2014-09-29 ENCOUNTER — Telehealth: Payer: Self-pay | Admitting: *Deleted

## 2014-09-29 ENCOUNTER — Ambulatory Visit (INDEPENDENT_AMBULATORY_CARE_PROVIDER_SITE_OTHER): Payer: No Typology Code available for payment source | Admitting: Internal Medicine

## 2014-09-29 ENCOUNTER — Encounter: Payer: Self-pay | Admitting: Internal Medicine

## 2014-09-29 VITALS — BP 128/74 | HR 78 | Temp 97.5°F | Ht 71.0 in | Wt 227.1 lb

## 2014-09-29 DIAGNOSIS — M5442 Lumbago with sciatica, left side: Secondary | ICD-10-CM | POA: Insufficient documentation

## 2014-09-29 DIAGNOSIS — I10 Essential (primary) hypertension: Secondary | ICD-10-CM

## 2014-09-29 DIAGNOSIS — M545 Low back pain, unspecified: Secondary | ICD-10-CM

## 2014-09-29 DIAGNOSIS — G8929 Other chronic pain: Secondary | ICD-10-CM | POA: Insufficient documentation

## 2014-09-29 DIAGNOSIS — Z23 Encounter for immunization: Secondary | ICD-10-CM

## 2014-09-29 DIAGNOSIS — E119 Type 2 diabetes mellitus without complications: Secondary | ICD-10-CM

## 2014-09-29 LAB — GLUCOSE, CAPILLARY: Glucose-Capillary: 71 mg/dL (ref 70–99)

## 2014-09-29 LAB — POCT GLYCOSYLATED HEMOGLOBIN (HGB A1C): Hemoglobin A1C: 5.5

## 2014-09-29 MED ORDER — INSULIN DETEMIR 100 UNIT/ML ~~LOC~~ SOLN: 10.0000 [IU] | Freq: Every day | SUBCUTANEOUS | Status: DC

## 2014-09-29 MED ORDER — IBUPROFEN 600 MG PO TABS: 600.0000 mg | ORAL_TABLET | Freq: Four times a day (QID) | ORAL | Status: DC | PRN

## 2014-09-29 NOTE — Progress Notes (Signed)
Case discussed with Dr. Jones at time of visit.  We reviewed the resident's history and exam and pertinent patient test results.  I agree with the assessment, diagnosis, and plan of care documented in the resident's note. 

## 2014-09-29 NOTE — Assessment & Plan Note (Signed)
Lab Results  Component Value Date   HGBA1C 5.5 09/29/2014   HGBA1C 13.7* 05/25/2014   HGBA1C 6.0 03/11/2014     Assessment: Comments: Patient w/ very tight HbA1c control compared to his previous HbA1c (most likely inflated 2/2 steroid use during gout flare). Currently taking Levemir 15 units daily + Metformin 500 mg bid. Does not describe significant low blood sugars on CBG checks, or symptoms of hypoglycemia.   Plan: Medications:  Decrease Levemir 15 units to 10 units daily. Continue Metformin.  Home glucose monitoring: Frequency:   Timing:   Instruction/counseling given: reminded to bring blood glucose meter & log to each visit Educational resources provided:   Self management tools provided: home glucose logbook Other plans: Decreased Levemir as above. Given significantly low HbA1c, would think that patient could likely do WITHOUT Insulin in the very near future.  -Instructed patient to keep close track of his CBG's prior to his next visit, at which time his basal insulin could possibly be decreased, or even discontinued.

## 2014-09-29 NOTE — Telephone Encounter (Signed)
Pt c/o severe back pain with no radiation until  he moves then pain will radiate to legs.   He has been using bio-freeze, rubs on back.  Not helping. He went to work today but not able to lift. Onset 2 days ago.  He did some lifting at work and not sure if that is cause.  No past Hx of back pain.  Will see today at 2:15

## 2014-09-29 NOTE — Assessment & Plan Note (Signed)
BP Readings from Last 3 Encounters:  09/29/14 128/74  07/22/14 130/80  06/09/14 108/63    Lab Results  Component Value Date   NA 135* 05/29/2014   K 4.7 05/29/2014   CREATININE 0.81 05/29/2014    Assessment: Comments: BP well controlled. Currently only taking Diltiazem 120 mg (24 hr caps).   Plan: Medications:  continue current medications Other plans: RTC in 4 weeks.

## 2014-09-29 NOTE — Telephone Encounter (Signed)
Agree with appointment this afternoon for examination/evaluation.

## 2014-09-29 NOTE — Patient Instructions (Addendum)
General Instructions:  1. Please schedule a follow up appointment for 4 weeks.  2. Please take all medications as prescribed.   Please take Ibuprofen 600 mg every 6 hours AS NEEDED for back pain.   Use heat pack for back pain.   Do not lift heavy objects until back pain has resolved. Continue normal activities otherwise.   3. If you have worsening of your symptoms or new symptoms arise, please call the clinic ([phone]), or go to the ER immediately if symptoms are severe.  You have done a great job in taking all your medications. I appreciate it very much. Please continue doing that.    Please bring your medicines with you each time you come to clinic.  Medicines may include prescription medications, over-the-counter medications, herbal remedies, eye drops, vitamins, or other pills.   Lumbosacral Strain Lumbosacral strain is a strain of any of the parts that make up your lumbosacral vertebrae. Your lumbosacral vertebrae are the bones that make up the lower third of your backbone. Your lumbosacral vertebrae are held together by muscles and tough, fibrous tissue (ligaments).  CAUSES  A sudden blow to your back can cause lumbosacral strain. Also, anything that causes an excessive stretch of the muscles in the low back can cause this strain. This is typically seen when people exert themselves strenuously, fall, lift heavy objects, bend, or crouch repeatedly. RISK FACTORS  Physically demanding work.  Participation in pushing or pulling sports or sports that require a sudden twist of the back (tennis, golf, baseball).  Weight lifting.  Excessive lower back curvature.  Forward-tilted pelvis.  Weak back or abdominal muscles or both.  Tight hamstrings. SIGNS AND SYMPTOMS  Lumbosacral strain may cause pain in the area of your injury or pain that moves (radiates) down your leg.  DIAGNOSIS Your health care provider can often diagnose lumbosacral strain through a physical exam. In some  cases, you may need tests such as X-ray exams.  TREATMENT  Treatment for your lower back injury depends on many factors that your clinician will have to evaluate. However, most treatment will include the use of anti-inflammatory medicines. HOME CARE INSTRUCTIONS   Avoid hard physical activities (tennis, racquetball, waterskiing) if you are not in proper physical condition for it. This may aggravate or create problems.  If you have a back problem, avoid sports requiring sudden body movements. Swimming and walking are generally safer activities.  Maintain good posture.  Maintain a healthy weight.  For acute conditions, you may put ice on the injured area.  Put ice in a plastic bag.  Place a towel between your skin and the bag.  Leave the ice on for 20 minutes, 2-3 times a day.  When the low back starts healing, stretching and strengthening exercises may be recommended. SEEK MEDICAL CARE IF:  Your back pain is getting worse.  You experience severe back pain not relieved with medicines. SEEK IMMEDIATE MEDICAL CARE IF:   You have numbness, tingling, weakness, or problems with the use of your arms or legs.  There is a change in bowel or bladder control.  You have increasing pain in any area of the body, including your belly (abdomen).  You notice shortness of breath, dizziness, or feel faint.  You feel sick to your stomach (nauseous), are throwing up (vomiting), or become sweaty.  You notice discoloration of your toes or legs, or your feet get very cold. MAKE SURE YOU:   Understand these instructions.  Will watch your condition.  Will  get help right away if you are not doing well or get worse. Document Released: 09/25/2005 Document Revised: 12/21/2013 Document Reviewed: 08/04/2013 [facility] Patient Information 2015 [place], [place]. This information is not intended to replace advice given to you by your health care provider. Make sure you discuss any questions you have with  your health care provider.

## 2014-09-29 NOTE — Progress Notes (Signed)
Subjective:   Patient ID: [name] male   DOB: [birth year] 53 y.o.   MRN: [id number]  HPI: [name] is a 53 y.o. male w/ PMHx of HTN, DM type II, and Gout, presents to the clinic today w/ complaints of lower back pain. Patient claims the pain has been present for 3 days and started after he lifted a heavy crate at work. He says the pain started as a dull pain and became more sharp, now w/ sudden stabbing pains as well. He claims it is worse w/ twisting, walking, and lifting. He has not tried anything for the pain. He also claims that he has had muscle spasms in the past that was quite different from this. He denies any alarm symptoms; no LE weakness, numbness, tingling, urinary or fecal incontinence, or saddle anesthesia. The patient is quite active and works daily, unloading trucks and lifting heavy weight.    Past Medical History  Diagnosis Date  . Allergy   . Hypertension   . Gout    Current Outpatient Prescriptions  Medication Sig Dispense Refill  . albuterol (PROVENTIL HFA;VENTOLIN HFA) 108 (90 BASE) MCG/ACT inhaler Inhale 2 puffs into the lungs every 6 (six) hours as needed for wheezing.  1 Inhaler  0  . allopurinol (ZYLOPRIM) 100 MG tablet Take 1 tablet (100 mg total) by mouth daily.  30 tablet  5  . Blood Glucose Monitoring Suppl (RELION CONFIRM GLUCOSE MONITOR) W/DEVICE KIT 1 Units by Does not apply route once.  1 kit  0  . diltiazem (TIAZAC) 120 MG 24 hr capsule Take 1 capsule (120 mg total) by mouth daily.  90 capsule  4  . glucose blood (FREESTYLE TEST STRIPS) test strip Use as instructed  100 each  12  . indomethacin (INDOCIN) 50 MG capsule TAKE 1 CAPSULE BY MOUTH EVERY 3 TIMES DAILY WITH MEALS  30 capsule  4  . insulin detemir (LEVEMIR) 100 UNIT/ML injection Inject 0.15 mLs (15 Units total) into the skin at bedtime.  10 mL  11  . insulin glargine (LANTUS) 100 UNIT/ML injection Inject 0.15 mLs (15 Units total) into the skin at bedtime.  10 mL  3  . Insulin  Syringe-Needle U-100 31G X 5/16" 1 ML MISC 1 Container by Does not apply route daily.  100 each  5  . loratadine (CLARITIN) 10 MG tablet Take 1 tablet (10 mg total) by mouth daily.  30 tablet  2  . Menthol, Topical Analgesic, (BIOFREEZE EX) Apply 1 application topically 2 (two) times daily as needed (pain).      . metFORMIN (GLUCOPHAGE) 500 MG tablet Take 1 tablet (500 mg total) by mouth 2 (two) times daily with a meal.  60 tablet  11   No current facility-administered medications for this visit.    Review of Systems: General: Denies fever, chills, diaphoresis, appetite change and fatigue.  Respiratory: Denies SOB, DOE, cough, and wheezing.   Cardiovascular: Denies chest pain and palpitations.  Gastrointestinal: Denies nausea, vomiting, abdominal pain, and diarrhea.  Genitourinary: Denies dysuria, increased frequency, and flank pain. Endocrine: Denies hot or cold intolerance, polyuria, and polydipsia. Musculoskeletal: Positive for back pain. Denies myalgias, joint swelling, arthralgias and gait problem.  Skin: Denies pallor, rash and wounds.  Neurological: Denies dizziness, seizures, syncope, weakness, lightheadedness, numbness and headaches.  Psychiatric/Behavioral: Denies mood changes, and sleep disturbances.  Objective:   Physical Exam: Filed Vitals:   09/29/14 1445  BP: 128/74  Pulse: 78  Temp: 97.5 F (36.4 C)  TempSrc: Oral  Height: '5\' 11"'  (1.803 m)  Weight: 227 lb 1.6 oz (103.012 kg)  SpO2: 96%   Physical Exam: General: Alert, cooperative, NAD. HEENT: PERRL, EOMI. Moist mucus membranes Neck: Full range of motion without pain, supple, no lymphadenopathy or carotid bruits Lungs: Clear to ascultation bilaterally, normal work of respiration, no wheezes, rales, rhonchi Heart: RRR, no murmurs, gallops, or rubs Abdomen: Soft, non-tender, non-distended, BS + Back: Tender over lumbar spine, pain w/ twisting, and bending forward. Only mild pain w/ positive straight leg raise  bilaterally. Extremities: No cyanosis, clubbing, or edema Neurologic: Alert & oriented X3, cranial nerves II-XII intact, strength grossly intact, sensation intact to light touch   Assessment & Plan:   Please see problem based assessment and plan.

## 2014-09-29 NOTE — Assessment & Plan Note (Signed)
Patient presents w/ what seems to be lumbar spinal strain. He exhibits signs and symptoms of muscle pain and possibly some localized nerve inflammation. He exhibits no alarm symptoms. Given the acute nature of his complaints and relatively mild physical exam findings, do not feel that imaging is necessary at this time.  -Instructed patient to continue normal activity WITHOUT heavy lifting.  -Hot compresses while sitting/resting -Ibuprofen 600 mg q6h prn for pain/inflammation -Patient to return in 2-4 weeks for re-evaluation. If pain is not improved by this time, can consider imaging and further investigation.

## 2014-11-10 ENCOUNTER — Other Ambulatory Visit: Payer: Self-pay | Admitting: *Deleted

## 2014-11-10 MED ORDER — ALLOPURINOL 100 MG PO TABS: 100.0000 mg | ORAL_TABLET | Freq: Every day | ORAL | Status: DC

## 2014-11-11 NOTE — Telephone Encounter (Signed)
Called to pharm 

## 2014-11-22 ENCOUNTER — Other Ambulatory Visit: Payer: Self-pay | Admitting: Internal Medicine

## 2015-02-10 ENCOUNTER — Encounter: Payer: Self-pay | Admitting: Internal Medicine

## 2015-02-10 ENCOUNTER — Ambulatory Visit (INDEPENDENT_AMBULATORY_CARE_PROVIDER_SITE_OTHER): Payer: No Typology Code available for payment source | Admitting: Internal Medicine

## 2015-02-10 ENCOUNTER — Ambulatory Visit (HOSPITAL_COMMUNITY)
Admission: RE | Admit: 2015-02-10 | Discharge: 2015-02-10 | Disposition: A | Discharge status: Home / Self Care | Payer: No Typology Code available for payment source | Source: Ambulatory Visit | Attending: Internal Medicine | Admitting: Internal Medicine

## 2015-02-10 VITALS — BP 148/93 | HR 77 | Temp 98.2°F | Ht 71.0 in | Wt 236.7 lb

## 2015-02-10 DIAGNOSIS — S6991XA Unspecified injury of right wrist, hand and finger(s), initial encounter: Secondary | ICD-10-CM

## 2015-02-10 DIAGNOSIS — M79644 Pain in right finger(s): Secondary | ICD-10-CM | POA: Insufficient documentation

## 2015-02-10 DIAGNOSIS — S6990XA Unspecified injury of unspecified wrist, hand and finger(s), initial encounter: Secondary | ICD-10-CM | POA: Insufficient documentation

## 2015-02-10 DIAGNOSIS — E118 Type 2 diabetes mellitus with unspecified complications: Secondary | ICD-10-CM

## 2015-02-10 DIAGNOSIS — M19041 Primary osteoarthritis, right hand: Secondary | ICD-10-CM | POA: Insufficient documentation

## 2015-02-10 LAB — GLUCOSE, CAPILLARY: GLUCOSE-CAPILLARY: 100 mg/dL — AB (ref 70–99)

## 2015-02-10 LAB — POCT GLYCOSYLATED HEMOGLOBIN (HGB A1C): HEMOGLOBIN A1C: 5.8

## 2015-02-10 NOTE — Patient Instructions (Signed)
General Instructions:   Thank you for bringing your medicines today. This helps [place] keep you safe from mistakes.   Progress Toward Treatment Goals:  No flowsheet data found.  Self Care Goals & Plans:  Self Care Goal 02/10/2015  Manage my medications take my medicines as prescribed; bring my medications to every visit; refill my medications on time  Monitor my health keep track of my blood glucose; bring my glucose meter and log to each visit  Eat healthy foods drink diet soda or water instead of juice or soda; eat more vegetables; eat foods that are low in salt; eat baked foods instead of fried foods; eat fruit for snacks and desserts  Stop smoking -    No flowsheet data found.   Care Management & Community Referrals:  Referral 09/29/2014  Referrals made to community resources none

## 2015-02-10 NOTE — Progress Notes (Signed)
Subjective:   Patient ID: [name] male   DOB: 14-Jul-1961 54 y.o.   MRN: [id number]  HPI: [name] is a 54 y.o. man pmh as listed below presents for acute hand injury.   Patient states that when he was entering the large full size freezer he overextended his right hand more his middle finger and had some popping sensation. He has still been able to maintain use and function but has had some edema and discoordinated extension of that middle finger. He's not had any erythema or warmth, fevers or chills, or palpable bone. He does not have any pain or discoloration/ulcerations in that hand. He has not tried any over-the-counter medication for management.  Past Medical History  Diagnosis Date  . Allergy   . Hypertension   . Gout    Current Outpatient Prescriptions  Medication Sig Dispense Refill  . albuterol (PROVENTIL HFA;VENTOLIN HFA) 108 (90 BASE) MCG/ACT inhaler Inhale 2 puffs into the lungs every 6 (six) hours as needed for wheezing. 1 Inhaler 0  . allopurinol (ZYLOPRIM) 100 MG tablet Take 1 tablet (100 mg total) by mouth daily. 90 tablet 1  . Blood Glucose Monitoring Suppl (RELION CONFIRM GLUCOSE MONITOR) W/DEVICE KIT 1 Units by Does not apply route once. 1 kit 0  . diltiazem (TIAZAC) 120 MG 24 hr capsule Take 1 capsule (120 mg total) by mouth daily. 90 capsule 4  . glucose blood (FREESTYLE TEST STRIPS) test strip Use as instructed 100 each 12  . ibuprofen (ADVIL,MOTRIN) 600 MG tablet Take 1 tablet (600 mg total) by mouth every 6 (six) hours as needed. 120 tablet 0  . indomethacin (INDOCIN) 50 MG capsule TAKE 1 CAPSULE BY MOUTH EVERY 3 TIMES DAILY WITH MEALS 30 capsule 4  . insulin detemir (LEVEMIR) 100 UNIT/ML injection Inject 0.1 mLs (10 Units total) into the skin at bedtime. 10 mL 11  . Insulin Syringe-Needle U-100 31G X 5/16" 1 ML MISC 1 Container by Does not apply route daily. 100 each 5  . loratadine (CLARITIN) 10 MG tablet TAKE 1 TABLET (10 MG TOTAL) BY MOUTH DAILY.  30 tablet 2  . Menthol, Topical Analgesic, (BIOFREEZE EX) Apply 1 application topically 2 (two) times daily as needed (pain).    . metFORMIN (GLUCOPHAGE) 500 MG tablet Take 1 tablet (500 mg total) by mouth 2 (two) times daily with a meal. 60 tablet 11   No current facility-administered medications for this visit.   Family History  Problem Relation Age of Onset  . Heart disease Mother   . Diabetes Mother   . Liver disease Father   . Diabetes Sister   . Colon polyps Neg Hx   . Colon cancer Neg Hx   . Esophageal cancer Neg Hx   . Stomach cancer Neg Hx    History   Social History  . Marital Status: Married    Spouse Name: N/A  . Number of Children: N/A  . Years of Education: 8   Occupational History  . employed     [employer]   Social History Main Topics  . Smoking status: Current Some Day Smoker -- 0.40 packs/day for 32 years    Types: Cigarettes  . Smokeless tobacco: Former Systems developer     Comment: PATIENT STATES HE HAS CUT BACK SOME. Occassional  . Alcohol Use: 21.6 oz/week    36 Cans of beer per week  . Drug Use: No     Comment: Used cocaine about 15 years before.  Marland Kitchen Sexual  Activity: Not on file   Other Topics Concern  . None   Social History Narrative   Patient lives in [place] with his wife and 3 daughters.   He has have a total of 6 daughters.   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   Review of Systems: Pertinent items are noted in HPI. Objective:  Physical Exam: Filed Vitals:   02/10/15 1332  BP: 148/93  Pulse: 77  Temp: 98.2 F (36.8 C)  TempSrc: Oral  Height: 5' 11" (1.803 m)  Weight: 236 lb 11.2 oz (107.366 kg)  SpO2: 97%   General: sitting in chair, NAD Cardiac: RRR, no rubs, murmurs or gallops Pulm: clear to auscultation bilaterally, moving normal volumes of air Abd: soft, nontender, nondistended, BS present Ext: warm and well perfused, no pedal edema Hands: slight edema over middle finger of  right hand, no erythema or warmth, can still actively flex DIP and PIP joints of all fingers, no swan neck deformity, some discoordinated extension of right hand middle finger Neuro: alert and oriented X3, cranial nerves II-XII grossly intact  Assessment & Plan:  Please see problem oriented charting  Pt discussed with Dr. Dareen Piano

## 2015-02-10 NOTE — Assessment & Plan Note (Signed)
Seems to be a hyperextension injury. Pt still has FROM of finger but some dis coordinated extension indicating a tendon injury. no neurovascular compromise -complete right hand xray to r/o avulsion fracture -referral to sports medicine  -tape injured finger for stablization

## 2015-02-10 NOTE — Assessment & Plan Note (Signed)
Lab Results  Component Value Date   HGBA1C 5.8 02/10/2015   HGBA1C 5.5 09/29/2014   HGBA1C 13.7* 05/25/2014     Assessment: Diabetes control:   Progress toward A1C goal:    Comments: excellent control  Plan: Medications:  Patient advised can stop metformin and continue with dietary and left modifications Home glucose monitoring: Frequency:   Timing:   Instruction/counseling given: reminded to get eye exam, reminded to bring blood glucose meter & log to each visit, reminded to bring medications to each visit and discussed diet Educational resources provided: brochure (denies) Self management tools provided: copy of home glucose meter download Other plans: Follow-up in 6 months

## 2015-02-14 NOTE — Progress Notes (Signed)
INTERNAL MEDICINE TEACHING ATTENDING ADDENDUM - Ryett Hamman, MD: I reviewed and discussed at the time of visit with the resident Dr. Sadek, the patient's medical history, physical examination, diagnosis and results of pertinent tests and treatment and I agree with the patient's care as documented.  

## 2015-02-28 ENCOUNTER — Encounter: Payer: Self-pay | Admitting: *Deleted

## 2015-03-27 ENCOUNTER — Other Ambulatory Visit: Payer: Self-pay | Admitting: Internal Medicine

## 2015-04-20 ENCOUNTER — Other Ambulatory Visit: Payer: Self-pay | Admitting: *Deleted

## 2015-04-20 MED ORDER — INDOMETHACIN 50 MG PO CAPS: ORAL_CAPSULE | ORAL | Status: DC

## 2015-04-20 MED ORDER — ALLOPURINOL 100 MG PO TABS
100.0000 mg | ORAL_TABLET | Freq: Every day | ORAL | Status: DC
Start: 2015-04-20 — End: 2016-07-22

## 2015-04-28 NOTE — Telephone Encounter (Signed)
Called to pharm 

## 2015-05-10 ENCOUNTER — Encounter: Payer: Self-pay | Admitting: *Deleted

## 2015-06-29 ENCOUNTER — Other Ambulatory Visit: Payer: Self-pay | Admitting: Internal Medicine

## 2015-06-29 NOTE — Telephone Encounter (Signed)
Needs an appointment with new PCP prior to filling; appointment for back pain.  Has Rx for indomethacin and ibuprofen (both NSAIDs).  From notes, was only supposed to be on high dose ibuprofen for 2-4 weeks for acute back pain.  Will not refill.

## 2015-07-13 ENCOUNTER — Telehealth: Payer: Self-pay | Admitting: Internal Medicine

## 2015-07-13 NOTE — Telephone Encounter (Addendum)
Call to patient to confirm appointment for 07/14/15 at 9:45 lmtcb

## 2015-07-14 ENCOUNTER — Ambulatory Visit: Payer: No Typology Code available for payment source | Admitting: Internal Medicine

## 2015-08-22 ENCOUNTER — Other Ambulatory Visit: Payer: Self-pay | Admitting: Internal Medicine

## 2015-08-28 ENCOUNTER — Ambulatory Visit (INDEPENDENT_AMBULATORY_CARE_PROVIDER_SITE_OTHER): Payer: No Typology Code available for payment source | Admitting: Internal Medicine

## 2015-08-28 ENCOUNTER — Encounter: Payer: Self-pay | Admitting: Internal Medicine

## 2015-08-28 VITALS — BP 150/97 | HR 68 | Temp 98.4°F

## 2015-08-28 DIAGNOSIS — Z23 Encounter for immunization: Secondary | ICD-10-CM | POA: Diagnosis not present

## 2015-08-28 DIAGNOSIS — E119 Type 2 diabetes mellitus without complications: Secondary | ICD-10-CM

## 2015-08-28 DIAGNOSIS — F17210 Nicotine dependence, cigarettes, uncomplicated: Secondary | ICD-10-CM

## 2015-08-28 DIAGNOSIS — I10 Essential (primary) hypertension: Secondary | ICD-10-CM

## 2015-08-28 DIAGNOSIS — E118 Type 2 diabetes mellitus with unspecified complications: Secondary | ICD-10-CM

## 2015-08-28 DIAGNOSIS — F17200 Nicotine dependence, unspecified, uncomplicated: Secondary | ICD-10-CM

## 2015-08-28 LAB — GLUCOSE, CAPILLARY: Glucose-Capillary: 113 mg/dL — ABNORMAL HIGH (ref 65–99)

## 2015-08-28 LAB — POCT GLYCOSYLATED HEMOGLOBIN (HGB A1C): Hemoglobin A1C: 6

## 2015-08-28 MED ORDER — NICOTINE POLACRILEX 2 MG MT GUM: 2.0000 mg | CHEWING_GUM | OROMUCOSAL | Status: DC | PRN

## 2015-08-28 MED ORDER — LOSARTAN POTASSIUM 25 MG PO TABS: 25.0000 mg | ORAL_TABLET | Freq: Every day | ORAL | Status: DC

## 2015-08-28 NOTE — Patient Instructions (Addendum)
[name],  It was great to meet you today; you're a great patient and I'm very impressed at how well you've managed your diabetes. Your sugars looked great since your last visit. We don't need to change a thing.  Your blood pressure was a little high and there was some protein in your urine, indicating your kidneys might be slightly damaged from the high blood pressure itself and perhaps the diabetes as well.  We're starting one medicine today:  1. It is called LOSARTAN. Please take it at night. This drug will help lower your blood pressure, help your kidneys work easier, and lower your risk for gout. This is a very safe drug that does NOT cause cough like another drug in its class can do.  We also got cholesterol levels and looked at your kidney function; I'll give you a call when I get the results. We MAY want to consider starting a "statin" drug to lower your cholesterol, which will also lower your risk of heart attack and stroke.  Thanks for being such a great patient, Dr. Melburn Hake

## 2015-08-28 NOTE — Assessment & Plan Note (Addendum)
He has great control of his diabetes since he stopped taking prednisone for his gout and cut out fried foods.  His A1c was 6.0 today, and he's not on any medications. His sugars for the last 3 months looked great, all within 100-120.  He'll get a retinal exam today and his foot exam was normal with good pulses.  We started Losartan today to help his proteinuria which I suspect is from his previous diabetes.

## 2015-08-28 NOTE — Assessment & Plan Note (Signed)
Blood pressure was previously controlled on diltiazem 120mg  XR but was elevated to 160s/90s today.  I'm not exactly sure why he is on the Diltiazem; he does have significant proteinuria so Dr. Kem Kays and I suspected that may have been the reason as calcium channel blockers can help proteinuria with minimal effect on the blood pressure.  Because he has proteinuria, we started him on losartan 25mg  daily.  This will 1) lower his blood pressure, 2) lower his proteinuria, and 3) decrease his serum uric acid and hopefully decrease his gout flares.  We may want to consider stopping his Diltiazem next time depending on how his pressures do.

## 2015-08-28 NOTE — Assessment & Plan Note (Signed)
He's smoking about 5 cigarettes per day for the last 40 years.  He wants to quit. Out of the options I listed, he was most interested in the gum so I gave him some.  He said we can talk about medications next visit depending on how the gum goes.

## 2015-08-28 NOTE — Progress Notes (Signed)
Patient ID: [name], male   DOB: 14-Jul-1961, 54 y.o.   MRN: [id number]   Subjective:   Patient ID: [name] male   DOB: 1961/12/29 54 y.o.   MRN: [id number]  HPI: [name] is a 54 y.o. with hypertension, gout, pre-diabetes, and tobacco use disorder here for a follow-up appointment.  Please see problem-based charting for assessment and plan.  Past Medical History  Diagnosis Date  . Allergy   . Hypertension   . Gout    Current Outpatient Prescriptions  Medication Sig Dispense Refill  . albuterol (PROVENTIL HFA;VENTOLIN HFA) 108 (90 BASE) MCG/ACT inhaler Inhale 2 puffs into the lungs every 6 (six) hours as needed for wheezing. 1 Inhaler 0  . allopurinol (ZYLOPRIM) 100 MG tablet Take 1 tablet (100 mg total) by mouth daily. 90 tablet 3  . Blood Glucose Monitoring Suppl (RELION CONFIRM GLUCOSE MONITOR) W/DEVICE KIT 1 Units by Does not apply route once. 1 kit 0  . diltiazem (TIAZAC) 120 MG 24 hr capsule TAKE 1 CAPSULE (120 MG TOTAL) BY MOUTH DAILY. 90 capsule 3  . glucose blood (FREESTYLE TEST STRIPS) test strip Use as instructed 100 each 12  . ibuprofen (ADVIL,MOTRIN) 600 MG tablet Take 1 tablet (600 mg total) by mouth every 6 (six) hours as needed. 120 tablet 0  . indomethacin (INDOCIN) 50 MG capsule TAKE 1 CAPSULE BY MOUTH EVERY 3 TIMES DAILY WITH MEALS 30 capsule 4  . Insulin Syringe-Needle U-100 31G X 5/16" 1 ML MISC 1 Container by Does not apply route daily. 100 each 5  . loratadine (CLARITIN) 10 MG tablet TAKE 1 TABLET (10 MG TOTAL) BY MOUTH DAILY. 30 tablet 2  . loratadine (CLARITIN) 10 MG tablet TAKE 1 TABLET (10 MG TOTAL) BY MOUTH DAILY. 30 tablet 2  . losartan (COZAAR) 25 MG tablet Take 1 tablet (25 mg total) by mouth daily. 30 tablet 11  . Menthol, Topical Analgesic, (BIOFREEZE EX) Apply 1 application topically 2 (two) times daily as needed (pain).    . nicotine polacrilex (NICORETTE) 2 MG gum Take 1 each (2 mg total) by mouth as needed for smoking cessation. 100  tablet 0   No current facility-administered medications for this visit.   Family History  Problem Relation Age of Onset  . Heart disease Mother   . Diabetes Mother   . Liver disease Father   . Diabetes Sister   . Colon polyps Neg Hx   . Colon cancer Neg Hx   . Esophageal cancer Neg Hx   . Stomach cancer Neg Hx    Social History   Social History  . Marital Status: Married    Spouse Name: N/A  . Number of Children: N/A  . Years of Education: 8   Occupational History  . employed     [employer]   Social History Main Topics  . Smoking status: Current Some Day Smoker -- 0.40 packs/day for 32 years    Types: Cigarettes  . Smokeless tobacco: Former Systems developer     Comment: PATIENT STATES HE HAS CUT BACK SOME. Occassional  . Alcohol Use: 21.6 oz/week    36 Cans of beer per week  . Drug Use: No     Comment: Used cocaine about 15 years before.  Marland Kitchen Sexual Activity: Not on file   Other Topics Concern  . Not on file   Social History Narrative   Patient lives in [place] with his wife and 3 daughters.   He has have a total of  6 daughters.   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   Review of Systems  Constitutional: Negative for fever and chills.  Eyes: Negative for blurred vision.  Respiratory: Negative for cough.   Cardiovascular: Negative for chest pain and orthopnea.  Gastrointestinal: Negative for heartburn, nausea and vomiting.  Skin: Negative for rash.  Neurological: Negative for dizziness and headaches.  Psychiatric/Behavioral: Positive for substance abuse.    Objective:  Physical Exam: Filed Vitals:   08/28/15 1330 08/28/15 1419  BP: 166/114 150/97  Pulse: 74 68  Temp: 98.4 F (36.9 C)   TempSrc: Oral   SpO2: 99%    Physical Exam  Constitutional: He appears well-developed and well-nourished.  HENT:  Head: Normocephalic and atraumatic.  Eyes: Conjunctivae are normal.  Neck: Normal range of motion. Neck supple.    Cardiovascular: Normal rate, regular rhythm, normal heart sounds and intact distal pulses.   Pulmonary/Chest: Effort normal and breath sounds normal.  Abdominal: Soft. Bowel sounds are normal.  Musculoskeletal: Normal range of motion.  Skin: Skin is warm and dry.   Assessment & Plan:

## 2015-08-29 ENCOUNTER — Telehealth: Payer: Self-pay | Admitting: Internal Medicine

## 2015-08-29 ENCOUNTER — Other Ambulatory Visit: Payer: Self-pay | Admitting: Internal Medicine

## 2015-08-29 DIAGNOSIS — E785 Hyperlipidemia, unspecified: Secondary | ICD-10-CM

## 2015-08-29 LAB — MICROALBUMIN / CREATININE URINE RATIO
Creatinine, Urine: 77.5 mg/dL
MICROALB/CREAT RATIO: 647.7 mg/g creat — ABNORMAL HIGH (ref 0.0–30.0)
MICROALBUM., U, RANDOM: 502 ug/mL

## 2015-08-29 LAB — BASIC METABOLIC PANEL
BUN / CREAT RATIO: 18 (ref 9–20)
BUN: 15 mg/dL (ref 6–24)
CHLORIDE: 99 mmol/L (ref 97–108)
CO2: 23 mmol/L (ref 18–29)
CREATININE: 0.85 mg/dL (ref 0.76–1.27)
Calcium: 10.1 mg/dL (ref 8.7–10.2)
GFR calc Af Amer: 113 mL/min/{1.73_m2} (ref >59)
GFR calc non Af Amer: 98 mL/min/{1.73_m2} (ref >59)
GLUCOSE: 101 mg/dL — AB (ref 65–99)
Potassium: 4.1 mmol/L (ref 3.5–5.2)
SODIUM: 140 mmol/L (ref 134–144)

## 2015-08-29 LAB — LIPID PANEL
CHOLESTEROL TOTAL: 239 mg/dL — AB (ref 100–199)
Chol/HDL Ratio: 4.5 ratio units (ref 0.0–5.0)
HDL: 53 mg/dL (ref >39)
Triglycerides: 543 mg/dL — ABNORMAL HIGH (ref 0–149)

## 2015-08-29 MED ORDER — ATORVASTATIN CALCIUM 40 MG PO TABS: 40.0000 mg | ORAL_TABLET | Freq: Every day | ORAL | Status: DC

## 2015-08-29 NOTE — Telephone Encounter (Signed)
Notified patient of hyperlipidemia and hypercholesterolemia and called in Lipitor 40mg .

## 2015-08-30 NOTE — Progress Notes (Signed)
Internal Medicine Clinic Attending  I saw and evaluated the patient.  I personally confirmed the key portions of the history and exam documented by Dr. Flores and I reviewed pertinent patient test results.  The assessment, diagnosis, and plan were formulated together and I agree with the documentation in the resident's note.  

## 2015-09-23 ENCOUNTER — Other Ambulatory Visit: Payer: Self-pay | Admitting: Internal Medicine

## 2015-12-12 ENCOUNTER — Encounter: Payer: Self-pay | Admitting: Internal Medicine

## 2016-04-18 ENCOUNTER — Other Ambulatory Visit: Payer: Self-pay | Admitting: Internal Medicine

## 2016-07-15 ENCOUNTER — Other Ambulatory Visit: Payer: Self-pay | Admitting: Internal Medicine

## 2016-07-19 ENCOUNTER — Encounter: Payer: Self-pay | Admitting: Internal Medicine

## 2016-07-19 ENCOUNTER — Ambulatory Visit: Payer: No Typology Code available for payment source

## 2016-07-19 ENCOUNTER — Ambulatory Visit (INDEPENDENT_AMBULATORY_CARE_PROVIDER_SITE_OTHER): Payer: BLUE CROSS/BLUE SHIELD | Admitting: Internal Medicine

## 2016-07-19 VITALS — BP 149/91 | HR 63 | Temp 97.7°F | Ht 71.0 in | Wt 227.3 lb

## 2016-07-19 DIAGNOSIS — E785 Hyperlipidemia, unspecified: Secondary | ICD-10-CM

## 2016-07-19 DIAGNOSIS — E1129 Type 2 diabetes mellitus with other diabetic kidney complication: Secondary | ICD-10-CM

## 2016-07-19 DIAGNOSIS — Z1159 Encounter for screening for other viral diseases: Secondary | ICD-10-CM

## 2016-07-19 DIAGNOSIS — R809 Proteinuria, unspecified: Secondary | ICD-10-CM | POA: Diagnosis not present

## 2016-07-19 DIAGNOSIS — I10 Essential (primary) hypertension: Secondary | ICD-10-CM | POA: Diagnosis not present

## 2016-07-19 DIAGNOSIS — F1090 Alcohol use, unspecified, uncomplicated: Secondary | ICD-10-CM

## 2016-07-19 DIAGNOSIS — E1169 Type 2 diabetes mellitus with other specified complication: Secondary | ICD-10-CM | POA: Insufficient documentation

## 2016-07-19 DIAGNOSIS — M1A49X0 Other secondary chronic gout, multiple sites, without tophus (tophi): Secondary | ICD-10-CM

## 2016-07-19 DIAGNOSIS — Z7289 Other problems related to lifestyle: Secondary | ICD-10-CM | POA: Insufficient documentation

## 2016-07-19 DIAGNOSIS — Z789 Other specified health status: Secondary | ICD-10-CM

## 2016-07-19 LAB — POCT GLYCOSYLATED HEMOGLOBIN (HGB A1C): Hemoglobin A1C: 6.2

## 2016-07-19 LAB — GLUCOSE, CAPILLARY: GLUCOSE-CAPILLARY: 74 mg/dL (ref 65–99)

## 2016-07-19 MED ORDER — INDOMETHACIN 50 MG PO CAPS: 50.0000 mg | ORAL_CAPSULE | Freq: Three times a day (TID) | ORAL | Status: DC | PRN

## 2016-07-19 MED ORDER — LOSARTAN POTASSIUM 25 MG PO TABS: 50.0000 mg | ORAL_TABLET | Freq: Every day | ORAL | Status: DC

## 2016-07-19 NOTE — Assessment & Plan Note (Addendum)
Assessment Given his age, he is at risk for chronic HCV. He denies a prior history of IV drug use or having received blood products. He is agreeable however for screening test today.  Plan -Check HCV Ab  ADDENDUM 07/22/2016  1:31 PM:  HCV negative.

## 2016-07-19 NOTE — Progress Notes (Deleted)
d 

## 2016-07-19 NOTE — Assessment & Plan Note (Signed)
Assessment He is requesting a refill of his allopurinol 100 mg daily. He has been taking indomethacin 50 mg capsules daily with breakfast and contrast to the instructions listed in our chart. He was not aware that allopurinol is a prophylactic medicine and indomethacin should only be used as needed. Otherwise, he denies having any recent episodes of a gout attack, as far back as the last several months. He does acknowledge heavy alcohol intake ["a tall bottle of Corona Light measuring roughly 20 ounces"] as well as red meat. His gout seems to affect his knees, ankles, elbows, and his wife reports that at one point his right elbow had significant swelling.  His physical exam today is without findings of an acute attack, joint swelling, erythema.  Plan -Refilled allopurinol 100 mg daily -Counseled the patient that allopurinol is a prophylactic medicine which should be taken daily whereas indomethacin should only be taken as needed -Counseled the patient on limiting his use of indomethacin in conjunction with ibuprofen given the risk for gastritis, especially with his daily alcohol intake

## 2016-07-19 NOTE — Progress Notes (Signed)
   Subjective:    Patient ID: [name], male    DOB: March 26, 1961, 55 y.o.   MRN: [id number]  HPI [name] is a 55 year old male who presents today with his wife for hypertension. Please see assessment & plan for status of chronic medical problems.  Review of Systems A pertinent review of systems can be found under each problem charted below.    Objective:   Physical Exam  Constitutional: He is oriented to person, place, and time. He appears well-developed and well-nourished.  HENT:  Head: Normocephalic and atraumatic.  Eyes: Conjunctivae are normal. No scleral icterus.  Cardiovascular: Normal rate and regular rhythm.   Pulmonary/Chest: Effort normal. No respiratory distress.  Musculoskeletal: He exhibits no edema or tenderness.  Neurological: He is alert and oriented to person, place, and time.  Skin: Skin is warm and dry.          Assessment & Plan:

## 2016-07-19 NOTE — Patient Instructions (Signed)
For your blood pressure, STOP diltiazem and DOUBLE your losartan. Take 2 tablets until you run out.  For the gout, TAKE allopurinol. Do NOT take indomethacin AND ibuprofen together. If you are finding you are using indomethacin a lot, we will need to consider a medicine to protect your stomach.  For your cholesterol, we will talk next week once we have your blood work back about the medicine.  Please see me back next week to recheck blood pressure.

## 2016-07-19 NOTE — Assessment & Plan Note (Addendum)
Assessment A1c 6.0 when last checked at 08/28/15. He reports checking his sugars occasionally just to see how they are doing and is unable to quantify them to me today but tells me they are doing "quite well." He has not been on any medications though was on some previously when he was taking prednisone for his chronic gout.  Given his other metabolic risk factors, he is certainly at risk for deterioration in glycemic control.  Plan -Check A1c today  ADDENDUM 07/22/2016  1:31 PM:  A1c 6.2, up from 6.0 back in 08/28/15.

## 2016-07-19 NOTE — Assessment & Plan Note (Addendum)
Assessment His initial blood pressure today is 165/95 though improved to 149/91 on recheck. He reports adherence to diltiazem XR 120 mg daily and losartan 25 mg daily. He denies prior cardiac history. He also does not report signs of hypertensive urgency, like headache, blurry vision, chest pain, difficulty breathing.  Given that his blood pressure is not less than 140/90 on 2 agents, I'm inclined to up titrate his losartan as it has benefited given his gout, proteinuria and to discontinue diltiazem altogether, especially since he is unclear of indications for this medication.  Plan -Increased losartan 25 mg to be taken once daily to 2 tablets once daily until he runs out of medication at which point we will represcribe 50 mg tablets -Follow-up in 1 week for blood pressure recheck -Check CMET today  ADDENDUM 07/22/2016  1:32 PM:  Electrolytes reassuring.

## 2016-07-20 LAB — CBC
HEMATOCRIT: 39.8 % (ref 37.5–51.0)
HEMOGLOBIN: 13.5 g/dL (ref 12.6–17.7)
MCH: 31.3 pg (ref 26.6–33.0)
MCHC: 33.9 g/dL (ref 31.5–35.7)
MCV: 92 fL (ref 79–97)
PLATELETS: 335 10*3/uL (ref 150–379)
RBC: 4.32 x10E6/uL (ref 4.14–5.80)
RDW: 14.2 % (ref 12.3–15.4)
WBC: 8 10*3/uL (ref 3.4–10.8)

## 2016-07-20 LAB — CMP14 + ANION GAP
ALT: 44 IU/L (ref 0–44)
AST: 41 IU/L — AB (ref 0–40)
Albumin/Globulin Ratio: 1.6 (ref 1.2–2.2)
Albumin: 4.4 g/dL (ref 3.5–5.5)
Alkaline Phosphatase: 100 IU/L (ref 39–117)
Anion Gap: 15 mmol/L (ref 10.0–18.0)
BUN/Creatinine Ratio: 18 (ref 9–20)
BUN: 15 mg/dL (ref 6–24)
Bilirubin Total: 0.3 mg/dL (ref 0.0–1.2)
CALCIUM: 9.6 mg/dL (ref 8.7–10.2)
CO2: 23 mmol/L (ref 18–29)
CREATININE: 0.82 mg/dL (ref 0.76–1.27)
Chloride: 105 mmol/L (ref 96–106)
GFR calc Af Amer: 115 mL/min/{1.73_m2} (ref >59)
GFR, EST NON AFRICAN AMERICAN: 100 mL/min/{1.73_m2} (ref >59)
GLOBULIN, TOTAL: 2.7 g/dL (ref 1.5–4.5)
Glucose: 72 mg/dL (ref 65–99)
Potassium: 4.3 mmol/L (ref 3.5–5.2)
SODIUM: 143 mmol/L (ref 134–144)
Total Protein: 7.1 g/dL (ref 6.0–8.5)

## 2016-07-20 LAB — HEPATITIS C ANTIBODY: HEP C VIRUS AB: 0.1 {s_co_ratio} (ref 0.0–0.9)

## 2016-07-22 ENCOUNTER — Other Ambulatory Visit: Payer: Self-pay | Admitting: Internal Medicine

## 2016-07-22 NOTE — Progress Notes (Signed)
Internal Medicine Clinic Attending  Case discussed with Dr. Patel,Rushil at the time of the visit.  We reviewed the resident's history and exam and pertinent patient test results.  I agree with the assessment, diagnosis, and plan of care documented in the resident's note.  

## 2016-07-22 NOTE — Assessment & Plan Note (Signed)
ADDENDUM 07/22/2016  1:33 PM:  AST 41, up from 19 back in 05/25/14. ALT 44, up from 27 back in 05/25/14. These borderline elevations may be non-specific, but given his ongoing alcohol use, I'm in favor of recommending he reduce his alcohol intake.

## 2016-07-29 ENCOUNTER — Encounter: Payer: Self-pay | Admitting: Internal Medicine

## 2016-07-29 ENCOUNTER — Ambulatory Visit: Payer: BLUE CROSS/BLUE SHIELD

## 2016-08-21 ENCOUNTER — Telehealth: Payer: Self-pay | Admitting: Internal Medicine

## 2016-08-21 NOTE — Telephone Encounter (Signed)
APT. REMINDER CALL, NO ANSWER, NO VOICEMAIL °

## 2016-08-22 ENCOUNTER — Ambulatory Visit (INDEPENDENT_AMBULATORY_CARE_PROVIDER_SITE_OTHER): Payer: BLUE CROSS/BLUE SHIELD | Admitting: Internal Medicine

## 2016-08-22 DIAGNOSIS — M1A9XX0 Chronic gout, unspecified, without tophus (tophi): Secondary | ICD-10-CM

## 2016-08-22 DIAGNOSIS — I10 Essential (primary) hypertension: Secondary | ICD-10-CM

## 2016-08-22 DIAGNOSIS — Z79899 Other long term (current) drug therapy: Secondary | ICD-10-CM

## 2016-08-22 DIAGNOSIS — M1A49X0 Other secondary chronic gout, multiple sites, without tophus (tophi): Secondary | ICD-10-CM

## 2016-08-22 MED ORDER — LOSARTAN POTASSIUM 50 MG PO TABS: 50.0000 mg | ORAL_TABLET | Freq: Every day | ORAL | 2 refills | Status: DC

## 2016-08-22 MED ORDER — INDOMETHACIN 50 MG PO CAPS: 50.0000 mg | ORAL_CAPSULE | Freq: Three times a day (TID) | ORAL | 2 refills | Status: DC | PRN

## 2016-08-22 NOTE — Progress Notes (Signed)
Internal Medicine Clinic Attending  I saw and evaluated the patient.  I personally confirmed the key portions of the history and exam documented by Dr. Hoffman and I reviewed pertinent patient test results.  The assessment, diagnosis, and plan were formulated together and I agree with the documentation in the resident's note.      

## 2016-08-22 NOTE — Progress Notes (Signed)
   CC: blood pressure follow-up  HPI:  [name] is a 55 y.o.male with past medical history noted below presents to [facility] for blood pressure follow-up and medication refills. On last office visit 07/19/2016 patient was told to stop diltiazem and start taking losartan 50 mg daily. Patient ran out of losartan 1 week ago and started taking diltiazem before making it to his appointment today.  Patient denies dizziness, blurry vision, headaches, lightheadedness, chest pain or shortness of breath. Patient also needs a refill on his indomethacin. He takes this as needed for musculoskeletal pain and for gout flares.  In the last month he is only taken 3 indomethacin tablets and this was for musculoskeletal pain as his job requires heavy lifting. He states he takes allopurinol daily and has not had a gout flare in over one year. When he does have gout flares they are usually in his ankles knees and first big toe.  Patient denies abdominal pain, nausea or vomiting.    Past Medical History:  Diagnosis Date  . Allergy   . Gout   . Hypertension     Review of Systems:  Review of Systems  Eyes: Negative for blurred vision.  Respiratory: Negative for shortness of breath.   Cardiovascular: Negative for chest pain.  Gastrointestinal: Negative for abdominal pain, nausea and vomiting.  Musculoskeletal: Positive for joint pain.  Neurological: Negative for dizziness and weakness.     Physical Exam:  Vitals:   08/22/16 0900 08/22/16 0931  BP: (!) 139/92 140/90  Pulse: 71 70  Temp: 97.8 F (36.6 C)   TempSrc: Oral   SpO2: 100%   Weight: 225 lb 8 oz (102.3 kg)   Height: 5' 11.5" (1.816 m)    Physical Exam  Constitutional: He is well-developed, well-nourished, and in no distress.  HENT:  Head: Normocephalic and atraumatic.  Cardiovascular: Normal rate, regular rhythm and normal heart sounds.  Exam reveals no gallop and no friction rub.   No murmur heard. Pulmonary/Chest:  Clear to  auscultation bilaterally with no rales, rhonchi or wheezing moving air adequately with normal effort.  Abdominal: Soft. He exhibits no distension. There is no tenderness.  Musculoskeletal: He exhibits no edema.     Assessment & Plan:   See encounters tab for problem based medical decision making.   Patient seen with Dr. Evette Doffing

## 2016-08-22 NOTE — Assessment & Plan Note (Addendum)
Assessment: History of gout Stable.  Patient states that he takes allopurinol 100 mg daily. He has not had a gout flare in over one year. He only uses indomethacin as needed for joint pain or if he were to have a gout flare. Today he denies any GI symptoms.   Plan: -refill indomethacin 50mg  tablets, with 2 refills

## 2016-08-22 NOTE — Patient Instructions (Addendum)
Mr. Fleischhacker  Indomethacin and Losartan has been sent to the pharmacy.  Please follow-up with your primary care physician in the next 3 months.

## 2016-08-22 NOTE — Assessment & Plan Note (Addendum)
Assessment: essential hypertension Today blood pressure is 139/92. Patient has not been taking losartan 50 mg daily for the past 1 week due to running out of his medication. At this point will not make any changes to medication. Blood pressure is improved from previous visit at 149/91.  Today he denies any chest pain, shortness of breath, dizziness, lightheadedness. He denies any issues with the medication.   Plan: -refilled losartan 50 mg pills once daily. 2 refills were given and told patient if he ever runs out he can call the clinic. -Follow-up appointment with PCP in 2 months

## 2016-09-17 ENCOUNTER — Other Ambulatory Visit: Payer: Self-pay | Admitting: Internal Medicine

## 2016-09-17 DIAGNOSIS — E118 Type 2 diabetes mellitus with unspecified complications: Secondary | ICD-10-CM

## 2016-09-17 DIAGNOSIS — I10 Essential (primary) hypertension: Secondary | ICD-10-CM

## 2016-10-06 ENCOUNTER — Other Ambulatory Visit: Payer: Self-pay | Admitting: Internal Medicine

## 2016-10-17 ENCOUNTER — Telehealth: Payer: Self-pay | Admitting: Internal Medicine

## 2016-10-17 NOTE — Telephone Encounter (Signed)
APT. REMINDER CALL, LMTCB °

## 2016-10-18 ENCOUNTER — Encounter: Payer: BLUE CROSS/BLUE SHIELD | Admitting: Internal Medicine

## 2016-11-28 ENCOUNTER — Telehealth: Payer: Self-pay | Admitting: Internal Medicine

## 2016-11-28 NOTE — Telephone Encounter (Signed)
APT. REMINDER CALL, LMTCB °

## 2016-11-29 ENCOUNTER — Ambulatory Visit (INDEPENDENT_AMBULATORY_CARE_PROVIDER_SITE_OTHER): Payer: BLUE CROSS/BLUE SHIELD | Admitting: Internal Medicine

## 2016-11-29 ENCOUNTER — Encounter: Payer: Self-pay | Admitting: Internal Medicine

## 2016-11-29 ENCOUNTER — Other Ambulatory Visit: Payer: Self-pay | Admitting: Internal Medicine

## 2016-11-29 VITALS — BP 129/70 | HR 89 | Temp 98.4°F | Wt 229.1 lb

## 2016-11-29 DIAGNOSIS — F17210 Nicotine dependence, cigarettes, uncomplicated: Secondary | ICD-10-CM

## 2016-11-29 DIAGNOSIS — E1169 Type 2 diabetes mellitus with other specified complication: Secondary | ICD-10-CM | POA: Diagnosis not present

## 2016-11-29 DIAGNOSIS — R808 Other proteinuria: Secondary | ICD-10-CM | POA: Diagnosis not present

## 2016-11-29 DIAGNOSIS — E784 Other hyperlipidemia: Secondary | ICD-10-CM

## 2016-11-29 DIAGNOSIS — E119 Type 2 diabetes mellitus without complications: Secondary | ICD-10-CM

## 2016-11-29 DIAGNOSIS — M545 Low back pain, unspecified: Secondary | ICD-10-CM

## 2016-11-29 DIAGNOSIS — E785 Hyperlipidemia, unspecified: Secondary | ICD-10-CM

## 2016-11-29 DIAGNOSIS — M1A9XX0 Chronic gout, unspecified, without tophus (tophi): Secondary | ICD-10-CM

## 2016-11-29 DIAGNOSIS — R809 Proteinuria, unspecified: Secondary | ICD-10-CM

## 2016-11-29 DIAGNOSIS — M25542 Pain in joints of left hand: Secondary | ICD-10-CM | POA: Diagnosis not present

## 2016-11-29 DIAGNOSIS — E1129 Type 2 diabetes mellitus with other diabetic kidney complication: Secondary | ICD-10-CM

## 2016-11-29 DIAGNOSIS — M79645 Pain in left finger(s): Secondary | ICD-10-CM

## 2016-11-29 DIAGNOSIS — I10 Essential (primary) hypertension: Secondary | ICD-10-CM

## 2016-11-29 DIAGNOSIS — Z79899 Other long term (current) drug therapy: Secondary | ICD-10-CM

## 2016-11-29 DIAGNOSIS — M1A49X0 Other secondary chronic gout, multiple sites, without tophus (tophi): Secondary | ICD-10-CM

## 2016-11-29 LAB — POCT GLYCOSYLATED HEMOGLOBIN (HGB A1C): Hemoglobin A1C: 6.1

## 2016-11-29 LAB — GLUCOSE, CAPILLARY: Glucose-Capillary: 78 mg/dL (ref 65–99)

## 2016-11-29 MED ORDER — DICLOFENAC SODIUM 1 % TD GEL: 4.0000 g | Freq: Four times a day (QID) | TRANSDERMAL | 1 refills | Status: DC

## 2016-11-29 MED ORDER — LOSARTAN POTASSIUM 50 MG PO TABS: 50.0000 mg | ORAL_TABLET | Freq: Every day | ORAL | 11 refills | Status: DC

## 2016-11-29 NOTE — Assessment & Plan Note (Signed)
He complained of pain at the base of his left thumb, radiating along the lateral palmar and dorsal aspect of hand to his wrist, it's constant , occasionally become sharp in character, certain body postures while asleep aggravates the pain which woke him up from sleep.he has tried indometacin with not much relief.he denies any focal weakness or tingling or numbness.he denies any swelling. He states that pain started while lifting a box couple of month ago.   His exam was positive for mildly restricted abduction and abduction. Strength was normal and sensations were intact.  Most probably due to tendinitis because of repetitive use.  Voltaren Gel and thumb brace.

## 2016-11-29 NOTE — Assessment & Plan Note (Signed)
His last lipid profile was done in August 2016.  He said he is compliant with Lipitor 40 mg daily.  Recheck lipid profile today. Continue Lipitor 40 mg daily.

## 2016-11-29 NOTE — Assessment & Plan Note (Addendum)
He stopped taking Levemir and metformin 2 years ago. Keeping his diabetes under control with diet and exercise. His A1c today was 6.1  We encouraged him with the good work. We will keep monitoring A1c every 3-6 month. Urine for microalbumin.

## 2016-11-29 NOTE — Patient Instructions (Addendum)
Thank you for visiting clinic today. You can use ibuprofen 600 mg if needed for your back pain. I'm giving you a prescription of Voltaren gel you can try that on your left thumb. You can also try a thumb brace to see if that helped. We are giving you some back exercises, try doing them regularly. Use your back brace each time to lift heavy weight. Try if you can limit your heavy lifting at work.  Back Exercises Introduction If you have pain in your back, do these exercises 2-3 times each day or as told by your doctor. When the pain goes away, do the exercises once each day, but repeat the steps more times for each exercise (do more repetitions). If you do not have pain in your back, do these exercises once each day or as told by your doctor. Exercises Single Knee to Chest  Do these steps 3-5 times in a row for each leg: 1. Lie on your back on a firm bed or the floor with your legs stretched out. 2. Bring one knee to your chest. 3. Hold your knee to your chest by grabbing your knee or thigh. 4. Pull on your knee until you feel a gentle stretch in your lower back. 5. Keep doing the stretch for 10-30 seconds. 6. Slowly let go of your leg and straighten it. Pelvic Tilt  Do these steps 5-10 times in a row: 1. Lie on your back on a firm bed or the floor with your legs stretched out. 2. Bend your knees so they point up to the ceiling. Your feet should be flat on the floor. 3. Tighten your lower belly (abdomen) muscles to press your lower back against the floor. This will make your tailbone point up to the ceiling instead of pointing down to your feet or the floor. 4. Stay in this position for 5-10 seconds while you gently tighten your muscles and breathe evenly. Cat-Cow  Do these steps until your lower back bends more easily: 1. Get on your hands and knees on a firm surface. Keep your hands under your shoulders, and keep your knees under your hips. You may put padding under your knees. 2. Let  your head hang down, and make your tailbone point down to the floor so your lower back is round like the back of a cat. 3. Stay in this position for 5 seconds. 4. Slowly lift your head and make your tailbone point up to the ceiling so your back hangs low (sags) like the back of a cow. 5. Stay in this position for 5 seconds. Press-Ups  Do these steps 5-10 times in a row: 1. Lie on your belly (face-down) on the floor. 2. Place your hands near your head, about shoulder-width apart. 3. While you keep your back relaxed and keep your hips on the floor, slowly straighten your arms to raise the top half of your body and lift your shoulders. Do not use your back muscles. To make yourself more comfortable, you may change where you place your hands. 4. Stay in this position for 5 seconds. 5. Slowly return to lying flat on the floor. Bridges  Do these steps 10 times in a row: 1. Lie on your back on a firm surface. 2. Bend your knees so they point up to the ceiling. Your feet should be flat on the floor. 3. Tighten your butt muscles and lift your butt off of the floor until your waist is almost as high as your knees.  If you do not feel the muscles working in your butt and the back of your thighs, slide your feet 1-2 inches farther away from your butt. 4. Stay in this position for 3-5 seconds. 5. Slowly lower your butt to the floor, and let your butt muscles relax. If this exercise is too easy, try doing it with your arms crossed over your chest. Belly Crunches  Do these steps 5-10 times in a row: 1. Lie on your back on a firm bed or the floor with your legs stretched out. 2. Bend your knees so they point up to the ceiling. Your feet should be flat on the floor. 3. Cross your arms over your chest. 4. Tip your chin a little bit toward your chest but do not bend your neck. 5. Tighten your belly muscles and slowly raise your chest just enough to lift your shoulder blades a tiny bit off of the  floor. 6. Slowly lower your chest and your head to the floor. Back Lifts  Do these steps 5-10 times in a row: 1. Lie on your belly (face-down) with your arms at your sides, and rest your forehead on the floor. 2. Tighten the muscles in your legs and your butt. 3. Slowly lift your chest off of the floor while you keep your hips on the floor. Keep the back of your head in line with the curve in your back. Look at the floor while you do this. 4. Stay in this position for 3-5 seconds. 5. Slowly lower your chest and your face to the floor. Contact a doctor if:  Your back pain gets a lot worse when you do an exercise.  Your back pain does not lessen 2 hours after you exercise. If you have any of these problems, stop doing the exercises. Do not do them again unless your doctor says it is okay. Get help right away if:  You have sudden, very bad back pain. If this happens, stop doing the exercises. Do not do them again unless your doctor says it is okay. This information is not intended to replace advice given to you by your health care provider. Make sure you discuss any questions you have with your health care provider. Document Released: 01/18/2011 Document Revised: 05/23/2016 Document Reviewed: 02/09/2015  2017 [employer]vier

## 2016-11-29 NOTE — Assessment & Plan Note (Signed)
He came to the clinic  with complaint of lower back pain for few months, non radiating, 3-4/10 in intensity, gets better with postural adjustment and heavy lifting makes it worse. Denies any tingling and numbness or focal weakness. 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  There was no tenderness on exam.  ROM was normal with subjective complaint of mild discomfort with bending.  Most probably due to muscle strain because of heavy lifting.  We provided him with some back exercises. And advised him to use back brace while lifting and try not to lift very heavy weights. We will ask him to use ibuprofen 600 mg for any acute exacerbation.

## 2016-11-29 NOTE — Assessment & Plan Note (Signed)
BP Readings from Last 3 Encounters:  11/29/16 129/70  08/22/16 140/90  07/19/16 (!) 149/91   His blood pressure is well controlled with losartan 50 mg daily.  Continue current management. He was provided with more refill for losartan 50 mg daily.

## 2016-11-29 NOTE — Progress Notes (Signed)
   CC: Lower backache, and pain at his left thumb metacarpophalangeal joint.  HPI:  [name] is a 55 y.o. his past medical history as listed below, came to the clinic  with complaint of lower back pain for few months, non radiating, 3-4/10 in intensity, gets better with postural adjustment and heavy lifting makes it worse. Denies any tingling and numbness or focal weakness. 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  Also complained of pain at the base of his left thumb, radiating along the lateral palmar and dorsal aspect of hand to his wrist, it's constant , occasionally become sharp in character, certain body postures while asleep aggravates the pain which woke him up from sleep.he has tried indometacin with not much relief.he denies any focal weakness or tingling or numbness.he denies any swelling. He states that pain started while lifting a box couple of month ago.   He has no other complaints.  Past Medical History:  Diagnosis Date  . Allergy   . Gout   . Hypertension     Review of Systems:  AS per HPI.  Physical Exam:  Vitals:   11/29/16 1538  BP: 129/70  Pulse: 89  Temp: 98.4 F (36.9 C)  TempSrc: Oral  SpO2: 99%  Weight: 229 lb 1.6 oz (103.9 kg)    General: Vital signs reviewed.  Patient is well-developed and well-nourished, in no acute distress and cooperative with exam.  Head: Normocephalic and atraumatic. Eyes: EOMI, conjunctivae normal, no scleral icterus.  Cardiovascular: RRR, S1 normal, S2 normal, no murmurs, gallops, or rubs. Pulmonary/Chest: Clear to auscultation bilaterally, no wheezes, rales, or rhonchi. Abdominal: Soft, non-tender, non-distended, BS +, no masses, organomegaly, or guarding present.  Musculoskeletal: No joint deformities, erythema, or stiffness, ROM full with subjective complaint of mild discomfort with bending over and twisting at lower back. Left thumb metacarpophalangeal joint, mildly tender  with mildly restricted ROM, especially adduction and abduction. Strength normal and sensations intact. Extremities: No lower extremity edema bilaterally,  pulses symmetric and intact bilaterally. No cyanosis or clubbing.   Assessment & Plan:   See Encounters Tab for problem based charting.  Patient seen with Dr. Evette Doffing.

## 2016-11-29 NOTE — Assessment & Plan Note (Signed)
He did not had any gout exacerbation for the last year.  Continue allopurinol.

## 2016-11-30 LAB — LIPID PANEL
CHOL/HDL RATIO: 2.4 ratio (ref 0.0–5.0)
CHOLESTEROL TOTAL: 145 mg/dL (ref 100–199)
HDL: 60 mg/dL (ref >39)
LDL Calculated: 46 mg/dL (ref 0–99)
Triglycerides: 195 mg/dL — ABNORMAL HIGH (ref 0–149)
VLDL CHOLESTEROL CAL: 39 mg/dL (ref 5–40)

## 2016-11-30 LAB — MICROALBUMIN / CREATININE URINE RATIO
Creatinine, Urine: 145.8 mg/dL
MICROALBUM., U, RANDOM: 698.3 ug/mL
Microalb/Creat Ratio: 478.9 mg/g creat — ABNORMAL HIGH (ref 0.0–30.0)

## 2016-12-02 NOTE — Progress Notes (Signed)
Internal Medicine Clinic Attending  I saw and evaluated the patient.  I personally confirmed the key portions of the history and exam documented by Dr. Amin and I reviewed pertinent patient test results.  The assessment, diagnosis, and plan were formulated together and I agree with the documentation in the resident's note. 

## 2016-12-04 ENCOUNTER — Telehealth: Payer: Self-pay | Admitting: *Deleted

## 2016-12-04 NOTE — Telephone Encounter (Signed)
Information sent to [employer] for PA review for Diclofenac Gel.  Sander Nephew, RN 12/04/2016 8:52 AM

## 2016-12-05 ENCOUNTER — Ambulatory Visit (INDEPENDENT_AMBULATORY_CARE_PROVIDER_SITE_OTHER): Payer: BLUE CROSS/BLUE SHIELD | Admitting: Internal Medicine

## 2016-12-05 VITALS — BP 153/124 | HR 78 | Temp 98.2°F | Ht 71.5 in | Wt 230.2 lb

## 2016-12-05 DIAGNOSIS — X58XXXA Exposure to other specified factors, initial encounter: Secondary | ICD-10-CM

## 2016-12-05 DIAGNOSIS — S0502XA Injury of conjunctiva and corneal abrasion without foreign body, left eye, initial encounter: Secondary | ICD-10-CM | POA: Diagnosis not present

## 2016-12-05 MED ORDER — DICLOFENAC SODIUM 0.1 % OP SOLN: 1.0000 [drp] | Freq: Four times a day (QID) | OPHTHALMIC | 0 refills | Status: DC | PRN

## 2016-12-05 MED ORDER — ERYTHROMYCIN 2 % EX OINT: TOPICAL_OINTMENT | CUTANEOUS | 0 refills | Status: DC

## 2016-12-05 MED ORDER — ERYTHROMYCIN 5 MG/GM OP OINT: 1.0000 "application " | TOPICAL_OINTMENT | Freq: Four times a day (QID) | OPHTHALMIC | 0 refills | Status: AC

## 2016-12-05 NOTE — Progress Notes (Signed)
   CC: Left eye pain  HPI:  [name] is a 55 y.o. with past medical history as outlined below who presents to Clinic for an acute visit for left eye pain. Patient was rearranging his matches this morning when a foreign object hit his left eye. Since then he has left eye pain with a sensation of "a bunch of rock in his eye." He denies any changes in his vision or contact lens. He has pain when he moves his left eye.   Past Medical History:  Diagnosis Date  . Allergy   . Gout   . Hypertension     Review of Systems:  Negative for changes in vision. Positive for pain in left eye with eye movement.   Physical Exam:  Vitals:   12/05/16 1407  BP: (!) 153/124  Pulse: 78  Temp: 98.2 F (36.8 C)  TempSrc: Oral  SpO2: 98%  Weight: 230 lb 3.2 oz (104.4 kg)  Height: 5' 11.5" (1.816 m)   General: unable to stay still due pain HEENT: b/l EOMI, PERLA, left injected conjunctiva with tearing, no photophobia, negative fluorescein dye test with wood's lamp examination.  Lungs: effort nl, no respiratory distress Neuro: CN II-XII grossly intact Skin: warm and dry, no pallor  Assessment & Plan:   See Encounters Tab for problem based charting.  Patient discussed with Dr. Lynnae January

## 2016-12-05 NOTE — Patient Instructions (Signed)
Use erythromycin ointment applied to left eye 4 times a day for 3-5 days.   Use diclofenac eye drops for eye pain up to 4 times a day.   If you develop worsening eye pain and changes in your vision call an ophthalmologist or go to the ED.   [place] [phone]

## 2016-12-05 NOTE — Assessment & Plan Note (Signed)
Assessment: On exam there were no foreign bodies in his left eye. Fluorescein  dye examination was unrevealing even with Wood's lamp examination. Pupils were reactive to light and EOMI b/l.   Plan: will treat for corneal abrasion with erythromycin ointment QID x 3-5 days and diclofenac eye gtts QID prn for pain. Given phone number for [facility] care and given return precautions.

## 2016-12-13 NOTE — Progress Notes (Signed)
Internal Medicine Clinic Attending  I saw and evaluated the patient.  I personally confirmed the key portions of the history and exam documented by Dr. Truong and I reviewed pertinent patient test results.  The assessment, diagnosis, and plan were formulated together and I agree with the documentation in the resident's note.  

## 2017-01-10 ENCOUNTER — Encounter: Payer: Self-pay | Admitting: Gastroenterology

## 2017-02-04 ENCOUNTER — Other Ambulatory Visit: Payer: Self-pay | Admitting: Internal Medicine

## 2017-02-04 DIAGNOSIS — M1A49X0 Other secondary chronic gout, multiple sites, without tophus (tophi): Secondary | ICD-10-CM

## 2017-08-02 ENCOUNTER — Other Ambulatory Visit: Payer: Self-pay | Admitting: Student in an Organized Health Care Education/Training Program

## 2017-08-08 ENCOUNTER — Ambulatory Visit (INDEPENDENT_AMBULATORY_CARE_PROVIDER_SITE_OTHER): Payer: BLUE CROSS/BLUE SHIELD | Admitting: Internal Medicine

## 2017-08-08 VITALS — BP 137/85 | HR 88 | Temp 98.4°F | Wt 226.0 lb

## 2017-08-08 DIAGNOSIS — J45909 Unspecified asthma, uncomplicated: Secondary | ICD-10-CM | POA: Insufficient documentation

## 2017-08-08 DIAGNOSIS — F17210 Nicotine dependence, cigarettes, uncomplicated: Secondary | ICD-10-CM

## 2017-08-08 DIAGNOSIS — I10 Essential (primary) hypertension: Secondary | ICD-10-CM

## 2017-08-08 DIAGNOSIS — M545 Low back pain, unspecified: Secondary | ICD-10-CM

## 2017-08-08 DIAGNOSIS — R809 Proteinuria, unspecified: Secondary | ICD-10-CM

## 2017-08-08 DIAGNOSIS — F17200 Nicotine dependence, unspecified, uncomplicated: Secondary | ICD-10-CM

## 2017-08-08 DIAGNOSIS — E1129 Type 2 diabetes mellitus with other diabetic kidney complication: Secondary | ICD-10-CM | POA: Diagnosis not present

## 2017-08-08 DIAGNOSIS — Z79899 Other long term (current) drug therapy: Secondary | ICD-10-CM

## 2017-08-08 DIAGNOSIS — J4520 Mild intermittent asthma, uncomplicated: Secondary | ICD-10-CM

## 2017-08-08 LAB — GLUCOSE, CAPILLARY: GLUCOSE-CAPILLARY: 87 mg/dL (ref 65–99)

## 2017-08-08 LAB — POCT GLYCOSYLATED HEMOGLOBIN (HGB A1C): Hemoglobin A1C: 6

## 2017-08-08 MED ORDER — ATORVASTATIN CALCIUM 40 MG PO TABS: 40.0000 mg | ORAL_TABLET | Freq: Every day | ORAL | 3 refills | Status: DC

## 2017-08-08 MED ORDER — ALBUTEROL SULFATE HFA 108 (90 BASE) MCG/ACT IN AERS: 2.0000 | INHALATION_SPRAY | Freq: Four times a day (QID) | RESPIRATORY_TRACT | 2 refills | Status: DC | PRN

## 2017-08-08 MED ORDER — NAPROXEN 500 MG PO TABS: 500.0000 mg | ORAL_TABLET | Freq: Two times a day (BID) | ORAL | 2 refills | Status: AC

## 2017-08-08 NOTE — Patient Instructions (Addendum)
Thank you for visiting clinic today. I'm glad that you are doing well and keeping your diabetes under good control. Keep up the good work. Try to avoid lifting heavy weights and activities which will aggravate your back pain. 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 I'm also sending you for lung function testing. Please follow-up in 6 month.

## 2017-08-08 NOTE — Assessment & Plan Note (Signed)
BP Readings from Last 3 Encounters:  08/08/17 137/85  12/05/16 (!) 153/124  11/29/16 129/70   He was having mildly elevated blood pressure today. He did took his medication this morning. According to patient whenever he checked his blood pressure at home it remains between 120 to 130.  Continue with current dose of losartan 50 mg daily, if his blood pressure remained elevated during next follow-up visit, we can increase losartan 100 mg daily.

## 2017-08-08 NOTE — Assessment & Plan Note (Signed)
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  -Albuterol inhaler-he was advised to use inhaler 15 minutes before going in the freezer. -We will check PFTs.

## 2017-08-08 NOTE — Progress Notes (Signed)
   CC: For follow-up of his diabetes, hypertension and lower back pain.  HPI:  [name] is a 56 y.o. with past medical history as listed below came to the clinic for follow-up of his chronic medical problems.  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 He denies any shortness of breath or coughing. He is a smoker, smokes about one third pack per day.  Please see assessment and plan part for his chronic problems.  Past Medical History:  Diagnosis Date  . Allergy   . Gout   . Hypertension    Review of Systems:  Only positive with bold. General: Denies fever, chills, fatigue, unexpected weight loss, change in appetite and diaphoresis.  Respiratory: Denies SOB, cough, DOE, chest tightness, and wheezing.   Cardiovascular: Denies chest pain and palpitations.  Gastrointestinal: Denies nausea, vomiting, abdominal pain, diarrhea, constipation, blood in stool and abdominal distention.  Genitourinary: Denies dysuria, urgency, frequency, hematuria, suprapubic pain and flank pain. Endocrine: Denies hot or cold intolerance, polyuria, and polydipsia. Musculoskeletal: Denies myalgias, back pain, joint swelling, arthralgias and gait problem.  Skin: Denies pallor, rash and wounds.  Neurological: Denies dizziness, headaches, weakness, lightheadedness, numbness, seizures, and syncope. Psychiatric/Behavioral: Denies mood changes, confusion, nervousness, sleep disturbance and agitation.   Physical Exam:  Vitals:   08/08/17 1605  BP: 137/85  Pulse: 88  Temp: 98.4 F (36.9 C)  TempSrc: Oral  SpO2: 99%  Weight: 226 lb (102.5 kg)   Vitals:   08/08/17 1605  BP: 137/85  Pulse: 88  Temp: 98.4 F (36.9 C)  TempSrc: Oral  SpO2: 99%  Weight: 226 lb (102.5 kg)   General: Vital signs reviewed.  Patient is well-developed and well-nourished, in no acute distress and  cooperative with exam.  Cardiovascular: RRR, S1 normal, S2 normal, no murmurs, gallops, or rubs. Pulmonary/Chest: Scattered wheezes bilaterally. Abdominal: Soft, non-tender, non-distended, BS +, no masses, organomegaly, or guarding present.  Musculoskeletal: No joint deformities, erythema, or stiffness, ROM full and nontender. Mild right-sided paraspinal lumbar area tenderness. Nonrestricted range of motion. Extremities: No lower extremity edema bilaterally,  pulses symmetric and intact bilaterally. No cyanosis or clubbing. Skin: Warm, dry and intact. No rashes or erythema. Psychiatric: Normal mood and affect. speech and behavior is normal. Cognition and memory are normal.  Assessment & Plan:   See Encounters Tab for problem based charting.  Patient discussed with Dr. Dareen Piano.

## 2017-08-08 NOTE — Assessment & Plan Note (Signed)
He has well-diet controlled diabetes with current A1c of 6.0. Today.  He was praised for his good work.  Keep monitoring every 6 month.

## 2017-08-08 NOTE — Assessment & Plan Note (Signed)
He smokes about one third pack per day.  He was consult again smoking.

## 2017-08-08 NOTE — Assessment & Plan Note (Addendum)
He continued to get lower back pain. Pain get worse with heavy lifting. He denies any radiation, tingling or numbness or any focal weakness. According to patient he recently has an MRI of his back, done at his work for [employer]. He was told that he had mild degenerative disease. No documentation available.  He was advised not to do heavy lifting, he states that he do not lift more than 50 pounds. Aleve helped with his pain. He takes Aleve occasionally.  Prescription for naproxen 500 mg was provided to be taken when necessary for lower back pain.

## 2017-08-13 NOTE — Progress Notes (Signed)
Internal Medicine Clinic Attending  Case discussed with Dr. Amin at the time of the visit.  We reviewed the resident's history and exam and pertinent patient test results.  I agree with the assessment, diagnosis, and plan of care documented in the resident's note.    

## 2017-12-15 ENCOUNTER — Other Ambulatory Visit: Payer: Self-pay | Admitting: Internal Medicine

## 2017-12-15 DIAGNOSIS — I10 Essential (primary) hypertension: Secondary | ICD-10-CM

## 2018-01-22 ENCOUNTER — Telehealth: Payer: Self-pay | Admitting: Internal Medicine

## 2018-01-22 DIAGNOSIS — I10 Essential (primary) hypertension: Secondary | ICD-10-CM

## 2018-01-22 NOTE — Telephone Encounter (Signed)
losartan (COZAAR) 50 MG tablet, Refill request @ [facility] on [address]

## 2018-01-22 NOTE — Telephone Encounter (Signed)
[facility] are [facility] and will call him

## 2018-01-30 ENCOUNTER — Encounter: Payer: Self-pay | Admitting: Internal Medicine

## 2018-01-30 ENCOUNTER — Ambulatory Visit (INDEPENDENT_AMBULATORY_CARE_PROVIDER_SITE_OTHER): Payer: BLUE CROSS/BLUE SHIELD | Admitting: Internal Medicine

## 2018-01-30 ENCOUNTER — Other Ambulatory Visit: Payer: Self-pay

## 2018-01-30 VITALS — BP 141/85 | HR 81 | Temp 98.0°F | Ht 71.0 in | Wt 224.7 lb

## 2018-01-30 DIAGNOSIS — M25551 Pain in right hip: Secondary | ICD-10-CM | POA: Diagnosis not present

## 2018-01-30 DIAGNOSIS — M545 Low back pain, unspecified: Secondary | ICD-10-CM

## 2018-01-30 DIAGNOSIS — I10 Essential (primary) hypertension: Secondary | ICD-10-CM | POA: Diagnosis not present

## 2018-01-30 DIAGNOSIS — F17210 Nicotine dependence, cigarettes, uncomplicated: Secondary | ICD-10-CM

## 2018-01-30 DIAGNOSIS — Z1211 Encounter for screening for malignant neoplasm of colon: Secondary | ICD-10-CM

## 2018-01-30 DIAGNOSIS — E1129 Type 2 diabetes mellitus with other diabetic kidney complication: Secondary | ICD-10-CM

## 2018-01-30 DIAGNOSIS — Z79899 Other long term (current) drug therapy: Secondary | ICD-10-CM | POA: Diagnosis not present

## 2018-01-30 DIAGNOSIS — R809 Proteinuria, unspecified: Secondary | ICD-10-CM

## 2018-01-30 DIAGNOSIS — E1169 Type 2 diabetes mellitus with other specified complication: Secondary | ICD-10-CM

## 2018-01-30 DIAGNOSIS — E119 Type 2 diabetes mellitus without complications: Secondary | ICD-10-CM

## 2018-01-30 LAB — POCT GLYCOSYLATED HEMOGLOBIN (HGB A1C): HEMOGLOBIN A1C: 5.8

## 2018-01-30 LAB — GLUCOSE, CAPILLARY: GLUCOSE-CAPILLARY: 93 mg/dL (ref 65–99)

## 2018-01-30 MED ORDER — LOSARTAN POTASSIUM 100 MG PO TABS: 100.0000 mg | ORAL_TABLET | Freq: Every day | ORAL | 3 refills | Status: DC

## 2018-01-30 NOTE — Assessment & Plan Note (Signed)
BP Readings from Last 3 Encounters:  01/30/18 (!) 141/85  08/08/17 137/85  12/05/16 (!) 153/124   His blood pressure remained a little elevated.  Increase losartan from 50 to 100 mg daily. Check BMP. Follow-up in 86-month

## 2018-01-30 NOTE — Assessment & Plan Note (Signed)
A1c today was 5.8. Foot exam normal. He was provided with referral for eye exam.  He remained very well controlled with diet and exercise. He was encouraged to continue the good work. We will keep monitoring A1c every 36-month.

## 2018-01-30 NOTE — Patient Instructions (Signed)
Thank you for visiting clinic today. You are doing very well with your diabetes, keep up the good work. As we discussed I am increasing the dose of your blood pressure medicine called losartan, take 2 pills daily until you will finish this and then get a new prescription for 100 mg daily. I am also checking some lab work for you today which include your kidney function, electrolyte and cholesterol.  We will call you with any abnormal results. Please continue follow-up with your orthopedic or spinal surgeon for your back pain. Follow-up in 61-month.

## 2018-01-30 NOTE — Assessment & Plan Note (Signed)
He started seeing a spinal surgeon at her home.  We will let spinal surgeon/orthopedic surgery work with him for his lower back pain.

## 2018-01-30 NOTE — Progress Notes (Signed)
   CC: For follow-up of his diabetes, hypertension and lower back pain.  HPI:  [name] is a 57 y.o. with past medical history as listed below came to the clinic for follow-up of his diabetes, hypertension or lower back pain.  He continued to have lower back pain radiating to right hip.  He is seeing a spinal surgeon at her home and he told him that he should see a orthopedic as he is having hip problem and he might need a hip replacement surgery.  That referral is being arranged by his spinal surgeon.  He has no other complaints.  Please see assessment and plan for his chronic problems.  Past Medical History:  Diagnosis Date  . Allergy   . Gout   . Hypertension    Review of Systems: Negative except mentioned in HPI.  Physical Exam:  Vitals:   01/30/18 1440  BP: (!) 141/85  Pulse: 81  Temp: 98 F (36.7 C)  TempSrc: Oral  SpO2: 99%  Weight: 224 lb 11.2 oz (101.9 kg)  Height: 5\' 11"  (1.803 m)   General: Vital signs reviewed.  Patient is well-developed and well-nourished, in no acute distress and cooperative with exam.  Head: Normocephalic and atraumatic. Eyes: EOMI, conjunctivae normal, no scleral icterus.  Neck: Supple, trachea midline, normal ROM, no JVD, masses, thyromegaly, or carotid bruit present.  Cardiovascular: RRR, S1 normal, S2 normal, no murmurs, gallops, or rubs. Pulmonary/Chest: Clear to auscultation bilaterally, no wheezes, rales, or rhonchi. Abdominal: Soft, non-tender, non-distended, BS +, no masses, organomegaly, or guarding present.  Musculoskeletal: No spinal or paraspinal tenderness.  Straight leg elicit pain in his back and right hip.  Mildly restricted external and internal rotation at right hip due to pain. Extremities: No lower extremity edema bilaterally,  pulses symmetric and intact bilaterally. No cyanosis or clubbing. Skin: Warm, dry and intact. No rashes or erythema. Psychiatric: Normal mood and affect. speech and behavior is normal.  Cognition and memory are normal.  Assessment & Plan:   See Encounters Tab for problem based charting.  Patient discussed with Dr. Angelia Mould.

## 2018-01-31 LAB — BMP8+ANION GAP
ANION GAP: 18 mmol/L (ref 10.0–18.0)
BUN/Creatinine Ratio: 20 (ref 9–20)
BUN: 16 mg/dL (ref 6–24)
CALCIUM: 9.8 mg/dL (ref 8.7–10.2)
CHLORIDE: 106 mmol/L (ref 96–106)
CO2: 21 mmol/L (ref 20–29)
Creatinine, Ser: 0.82 mg/dL (ref 0.76–1.27)
GFR calc Af Amer: 113 mL/min/{1.73_m2} (ref >59)
GFR calc non Af Amer: 98 mL/min/{1.73_m2} (ref >59)
GLUCOSE: 101 mg/dL — AB (ref 65–99)
POTASSIUM: 4.1 mmol/L (ref 3.5–5.2)
Sodium: 145 mmol/L — ABNORMAL HIGH (ref 134–144)

## 2018-01-31 LAB — LIPID PANEL
CHOLESTEROL TOTAL: 181 mg/dL (ref 100–199)
Chol/HDL Ratio: 3.6 ratio (ref 0.0–5.0)
HDL: 50 mg/dL (ref >39)
TRIGLYCERIDES: 463 mg/dL — AB (ref 0–149)

## 2018-02-02 ENCOUNTER — Encounter: Payer: Self-pay | Admitting: Gastroenterology

## 2018-02-02 NOTE — Progress Notes (Signed)
Internal Medicine Clinic Attending  Case discussed with Dr. Amin at the time of the visit.  We reviewed the resident's history and exam and pertinent patient test results.  I agree with the assessment, diagnosis, and plan of care documented in the resident's note.    

## 2018-03-04 DIAGNOSIS — M87051 Idiopathic aseptic necrosis of right femur: Secondary | ICD-10-CM | POA: Insufficient documentation

## 2018-03-05 ENCOUNTER — Encounter (INDEPENDENT_AMBULATORY_CARE_PROVIDER_SITE_OTHER): Payer: Self-pay | Admitting: Orthopaedic Surgery

## 2018-03-05 ENCOUNTER — Ambulatory Visit (INDEPENDENT_AMBULATORY_CARE_PROVIDER_SITE_OTHER): Payer: BLUE CROSS/BLUE SHIELD

## 2018-03-05 ENCOUNTER — Ambulatory Visit (INDEPENDENT_AMBULATORY_CARE_PROVIDER_SITE_OTHER): Payer: BLUE CROSS/BLUE SHIELD | Admitting: Orthopaedic Surgery

## 2018-03-05 DIAGNOSIS — M87051 Idiopathic aseptic necrosis of right femur: Secondary | ICD-10-CM

## 2018-03-05 MED ORDER — TRAMADOL HCL 50 MG PO TABS: 50.0000 mg | ORAL_TABLET | Freq: Three times a day (TID) | ORAL | 2 refills | Status: DC | PRN

## 2018-03-05 NOTE — Progress Notes (Signed)
Office Visit Note   Patient: [name]           Date of Birth: 07-24-1961           MRN: [id number] Visit Date: 03/05/2018              Requested by: Lorella Nimrod, MD [address] [place], [place] [address] PCP: Lorella Nimrod, MD   Assessment & Plan: Visit Diagnoses:  1. Avascular necrosis of bone of right hip [facility])     Plan: Impression is 57 year old gentleman with advanced degenerative joint disease of his right hip secondary to avascular necrosis.  Patient has failed treatment and wishes to a right total hip replacement.  We will go ahead and get him scheduled in the near future.  I discussed the risk benefits and alternatives to surgery and he wishes to proceed.  Follow-Up Instructions: Return if symptoms worsen or fail to improve.   Orders:  Orders Placed This Encounter  Procedures  . XR HIP UNILAT W OR W/O PELVIS 2-3 VIEWS RIGHT   Meds ordered this encounter  Medications  . traMADol (ULTRAM) 50 MG tablet    Sig: Take 1-2 tablets (50-100 mg total) by mouth 3 (three) times daily as needed.    Dispense:  30 tablet    Refill:  2      Procedures: No procedures performed   Clinical Data: No additional findings.   Subjective: Chief Complaint  Patient presents with  . Right Hip - Pain    Patient is a very pleasant 57 year old gentleman who has had chronic right hip pain.  He currently takes naproxen with partial relief.  He works as a Training and development officer at [employer].  He endorses limping is significant difficulty with ADLs and chronic night pain.  He denies any numbness and tingling.  He smokes a pack of cigarettes per week.  He also endorses groin pain.    Review of Systems  Constitutional: Negative.   All other systems reviewed and are negative.    Objective: Vital Signs: There were no vitals taken for this visit.  Physical Exam  Constitutional: He is oriented to person, place, and time. He appears well-developed and well-nourished.  HENT:  Head: Normocephalic and  atraumatic.  Eyes: Pupils are equal, round, and reactive to light.  Neck: Neck supple.  Pulmonary/Chest: Effort normal.  Abdominal: Soft.  Musculoskeletal: Normal range of motion.  Neurological: He is alert and oriented to person, place, and time.  Skin: Skin is warm.  Psychiatric: He has a normal mood and affect. His behavior is normal. Judgment and thought content normal.  Nursing note and vitals reviewed.   Ortho Exam Right hip exam shows slightly shorter right lower extremity.  He has pain with rotation and positive Stinchfield sign.  Trochanteric bursa is nontender.  Negative straight leg. Specialty Comments:  No specialty comments available.  Imaging: Xr Hip Unilat W Or W/o Pelvis 2-3 Views Right  Result Date: 03/05/2018 Advanced right hip degenerative joint disease.    PMFS History: Patient Active Problem List   Diagnosis Date Noted  . Avascular necrosis of bone of right hip ([place]) 03/04/2018  . Asthma in adult 08/08/2017  . Corneal abrasion, left 12/05/2016  . Need for hepatitis C screening test 07/19/2016  . Alcohol use 07/19/2016  . Hyperlipidemia associated with type 2 diabetes mellitus ([place]) 07/19/2016  . Lower back pain 09/29/2014  . Type 2 diabetes mellitus with proteinuria ([place]) 05/25/2014  . Tobacco use disorder 08/31/2013  . Gout 04/30/2013  .  Essential hypertension, benign 04/30/2013   Past Medical History:  Diagnosis Date  . Allergy   . Gout   . Hypertension     Family History  Problem Relation Age of Onset  . Heart disease Mother   . Diabetes Mother   . Liver disease Father   . Diabetes Sister   . Colon polyps Neg Hx   . Colon cancer Neg Hx   . Esophageal cancer Neg Hx   . Stomach cancer Neg Hx     Past Surgical History:  Procedure Laterality Date  . ANKLE ARTHROSCOPY W/ OPEN REPAIR  2008   both ankles   Social History   Occupational History  . Occupation: employed    Fish farm manager: UNEMPLOYED    Comment: Marketing executive  Tobacco  Use  . Smoking status: Current Some Day Smoker    Packs/day: 0.40    Years: 32.00    Pack years: 12.80    Types: Cigarettes  . Smokeless tobacco: Former Systems developer  . Tobacco comment: 1 pk per week   Substance and Sexual Activity  . Alcohol use: Not on file  . Drug use: No    Comment: Used cocaine about 15 years before.  Marland Kitchen Sexual activity: Not on file

## 2018-03-09 ENCOUNTER — Telehealth (INDEPENDENT_AMBULATORY_CARE_PROVIDER_SITE_OTHER): Payer: Self-pay | Admitting: Orthopaedic Surgery

## 2018-03-09 NOTE — Telephone Encounter (Signed)
See message below °

## 2018-03-09 NOTE — Telephone Encounter (Signed)
Patient's wife called stating that the patient is experiencing a lot of pain and wanted to know if the injection is still an option.  CB#[phone].  Thank you.

## 2018-03-09 NOTE — Telephone Encounter (Signed)
Yes but he will need to wait at least 6 weeks before he can have hip replacement.

## 2018-03-09 NOTE — Telephone Encounter (Signed)
APPT MADE AND PATIENT AWARE ABOUT EVERYTHING.

## 2018-03-10 ENCOUNTER — Ambulatory Visit (INDEPENDENT_AMBULATORY_CARE_PROVIDER_SITE_OTHER): Payer: BLUE CROSS/BLUE SHIELD

## 2018-03-10 ENCOUNTER — Ambulatory Visit (INDEPENDENT_AMBULATORY_CARE_PROVIDER_SITE_OTHER): Payer: BLUE CROSS/BLUE SHIELD | Admitting: Orthopaedic Surgery

## 2018-03-10 DIAGNOSIS — M87051 Idiopathic aseptic necrosis of right femur: Secondary | ICD-10-CM | POA: Diagnosis not present

## 2018-03-10 DIAGNOSIS — M25551 Pain in right hip: Secondary | ICD-10-CM

## 2018-03-10 MED ORDER — BUPIVACAINE HCL 0.5 % IJ SOLN
3.0000 mL | INTRAMUSCULAR | Status: AC | PRN
  Administered 2018-03-10: 3 mL via INTRA_ARTICULAR

## 2018-03-10 MED ORDER — TRIAMCINOLONE ACETONIDE 40 MG/ML IJ SUSP
80.0000 mg | INTRAMUSCULAR | Status: AC | PRN
  Administered 2018-03-10: 80 mg via INTRA_ARTICULAR

## 2018-03-10 NOTE — Progress Notes (Signed)
Patient underwent right hip intraarticular injection by Dr. Ernestina Patches today.

## 2018-03-10 NOTE — Progress Notes (Signed)
[name] - 57 y.o. male MRN [id number]  Date of birth: [birth year]  Office Visit Note: Visit Date: 03/10/2018 PCP: Lorella Nimrod, MD Referred by: Lorella Nimrod, MD  Subjective: Chief Complaint  Patient presents with  . Right Knee - Pain   HPI: [name] is a 57 year old gentleman with right hip and groin pain which is been worsening and he saw Dr. Erlinda Hong for evaluation and management and comes in today for work in diagnostic hopefully therapeutic right anesthetic hip arthrogram.    ROS Otherwise per HPI.  Assessment & Plan: Visit Diagnoses:  1. Avascular necrosis of bone of right hip (HCC)     Plan: Findings:  Diagnostic and hopefully therapeutic right hip anesthetic arthrogram.  Patient did have relief of some of his symptoms did have increased range of motion.    Meds & Orders: No orders of the defined types were placed in this encounter.  No orders of the defined types were placed in this encounter.   Follow-up: Return if symptoms worsen or fail to improve.   Procedures: Large Joint Inj: R hip joint on 03/10/2018 4:24 PM Indications: pain and diagnostic evaluation Details: 22 G needle, anterior approach  Arthrogram: Yes  Medications: 80 mg triamcinolone acetonide 40 MG/ML; 3 mL bupivacaine 0.5 % Outcome: tolerated well, no immediate complications  Arthrogram demonstrated excellent flow of contrast throughout the joint surface without extravasation or obvious defect.  The patient had relief of symptoms during the anesthetic phase of the injection.  Procedure, treatment alternatives, risks and benefits explained, specific risks discussed. Consent was given by the patient. Immediately prior to procedure a time out was called to verify the correct patient, procedure, equipment, support staff and site/side marked as required. Patient was prepped and draped in the usual sterile fashion.      No notes on file   Clinical History: No specialty comments available.   He  reports that he has been smoking cigarettes.  He has a 12.80 pack-year smoking history. He has quit using smokeless tobacco.  Recent Labs    08/08/17 1617 01/30/18 1536  HGBA1C 6.0 5.8    Objective:  VS:  HT:    WT:   BMI:     BP:   HR: bpm  TEMP: ( )  RESP:  Physical Exam  Ortho Exam Imaging: No results found.  Past Medical/Family/Surgical/Social History: Medications & Allergies reviewed per EMR, new medications updated. Patient Active Problem List   Diagnosis Date Noted  . Avascular necrosis of bone of right hip ([place]) 03/04/2018  . Asthma in adult 08/08/2017  . Corneal abrasion, left 12/05/2016  . Need for hepatitis C screening test 07/19/2016  . Alcohol use 07/19/2016  . Hyperlipidemia associated with type 2 diabetes mellitus ([place]) 07/19/2016  . Lower back pain 09/29/2014  . Type 2 diabetes mellitus with proteinuria ([place]) 05/25/2014  . Tobacco use disorder 08/31/2013  . Gout 04/30/2013  . Essential hypertension, benign 04/30/2013   Past Medical History:  Diagnosis Date  . Allergy   . Gout   . Hypertension    Family History  Problem Relation Age of Onset  . Heart disease Mother   . Diabetes Mother   . Liver disease Father   . Diabetes Sister   . Colon polyps Neg Hx   . Colon cancer Neg Hx   . Esophageal cancer Neg Hx   . Stomach cancer Neg Hx    Past Surgical History:  Procedure Laterality Date  . ANKLE ARTHROSCOPY W/ OPEN REPAIR  2008   both ankles   Social History   Occupational History  . 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 Smoking status: Current Some Day Smoker    Packs/day: 0.40    Years: 32.00    Pack years: 12.80    Types: Cigarettes  . Smokeless tobacco: Former Systems developer  . Tobacco comment: 1 pk per week   Substance and Sexual Activity  . Alcohol use: Not on file  . Drug use: No    Comment: Used cocaine about 15 years before.  Marland Kitchen Sexual activity: Not on file

## 2018-03-23 ENCOUNTER — Telehealth (INDEPENDENT_AMBULATORY_CARE_PROVIDER_SITE_OTHER): Payer: Self-pay | Admitting: Orthopaedic Surgery

## 2018-03-23 NOTE — Telephone Encounter (Signed)
Patients wife asking for a return call regarding a surgery for her husband, said she has some very important questions to ask you. CB # [phone]

## 2018-03-24 NOTE — Telephone Encounter (Signed)
Spoke with patient and wife in office today and scheduled

## 2018-03-26 ENCOUNTER — Telehealth: Payer: Self-pay | Admitting: *Deleted

## 2018-03-26 NOTE — Telephone Encounter (Signed)
Phone call to number listed, not sure who the identified person was on voicemail. Message left to call to reschedule previsit that was no showed on 03/26/18. Reschedule must occur by 5 pm today or colonoscopy scheduled 04/09/18 will be cancelled and both PV and colon will have to be rescheduled.

## 2018-03-26 NOTE — Telephone Encounter (Signed)
Per wife Langley Gauss, pt needing to have a hip replacement and at this time needs to delay colonoscopy. No GI problems at this time. Will call back to reschedule

## 2018-04-02 ENCOUNTER — Telehealth (INDEPENDENT_AMBULATORY_CARE_PROVIDER_SITE_OTHER): Payer: Self-pay | Admitting: Orthopaedic Surgery

## 2018-04-02 NOTE — Telephone Encounter (Signed)
How much is he taking?

## 2018-04-02 NOTE — Telephone Encounter (Signed)
See message please advise 

## 2018-04-02 NOTE — Telephone Encounter (Signed)
He can do TID

## 2018-04-02 NOTE — Telephone Encounter (Signed)
Tramadol Takes 2 tabs bid, supplements it with Tylenol

## 2018-04-02 NOTE — Telephone Encounter (Signed)
Med refill  Tramadol pt stated the dosage is not working to well

## 2018-04-03 NOTE — Telephone Encounter (Signed)
Tried to call patient,  no answer. If he calls back let him know he is able to take Tramadol up to  3 times a day.

## 2018-04-09 ENCOUNTER — Encounter: Payer: BLUE CROSS/BLUE SHIELD | Admitting: Gastroenterology

## 2018-04-27 ENCOUNTER — Other Ambulatory Visit (INDEPENDENT_AMBULATORY_CARE_PROVIDER_SITE_OTHER): Payer: Self-pay

## 2018-04-28 ENCOUNTER — Encounter: Payer: Self-pay | Admitting: Internal Medicine

## 2018-04-29 ENCOUNTER — Encounter (HOSPITAL_COMMUNITY): Payer: Self-pay

## 2018-04-29 NOTE — Pre-Procedure Instructions (Signed)
[name]  04/29/2018      [id number] [address], [place] [address] Phone: [phone] Fax: [phone]    Your procedure is scheduled on May 07, 2018.  Report to [facility] at [phone] AM.  Call this number if you have problems the morning of surgery:  [phone]  Continue all medications as directed by your physician except follow these medication instructions before surgery below   Remember:  Do not eat food or drink liquids after midnight.  Take these medicines the morning of surgery with A SIP OF WATER  Albuterol inhaler (if needed)-bring inhaler with you Allopurinol (zyloprim) tramadol (ultram)  7 days prior to surgery STOP taking any indomethacin (indocin), diclofenac (voltaren) gel,  Aspirin (unless otherwise instructed by your surgeon), Aleve, Naproxen, Ibuprofen, Motrin, Advil, Goody's, BC's, all herbal medications, fish oil, and all vitamins    How to Manage Your Diabetes Before and After Surgery  Why is it important to control my blood sugar before and after surgery? . Improving blood sugar levels before and after surgery helps healing and can limit problems. . A way of improving blood sugar control is eating a healthy diet by: o  Eating less sugar and carbohydrates o  Increasing activity/exercise o  Talking with your doctor about reaching your blood sugar goals . High blood sugars (greater than 180 mg/dL) can raise your risk of infections and slow your recovery, so you will need to focus on controlling your diabetes during the weeks before surgery. . Make sure that the doctor who takes care of your diabetes knows about your planned surgery including the date and location.  How do I manage my blood sugar before surgery? . Check your blood sugar at least 4 times a day, starting 2 days before surgery, to make sure that the level is not too high or low. o Check your blood  sugar the morning of your surgery when you wake up and every 2 hours until you get to the Short Stay unit. . If your blood sugar is less than 70 mg/dL, you will need to treat for low blood sugar: o Do not take insulin. o Treat a low blood sugar (less than 70 mg/dL) with  cup of clear juice (cranberry or apple), 4 glucose tablets, OR glucose gel. Recheck blood sugar in 15 minutes after treatment (to make sure it is greater than 70 mg/dL). If your blood sugar is not greater than 70 mg/dL on recheck, call [phone] o  for further instructions. . Report your blood sugar to the short stay nurse when you get to Short Stay.  . If you are admitted to the hospital after surgery: o Your blood sugar will be checked by the staff and you will probably be given insulin after surgery (instead of oral diabetes medicines) to make sure you have good blood sugar levels. o The goal for blood sugar control after surgery is 80-180 mg/dL.   Reviewed and Endorsed by [facility] Patient Education Committee, August 2015  Contacts, dentures or bridgework may not be worn into surgery.  Leave your suitcase in the car.  After surgery it may be brought to your room.  For patients admitted to the hospital, discharge time will be determined by your treatment team.  Patients discharged the day of surgery will not be allowed to drive home.   [place]- Preparing For Surgery  Before surgery, you can play an  important role. Because skin is not sterile, your skin needs to be as free of germs as possible. You can reduce the number of germs on your skin by washing with CHG (chlorahexidine gluconate) Soap before surgery.  CHG is an antiseptic cleaner which kills germs and bonds with the skin to continue killing germs even after washing.  Please do not use if you have an allergy to CHG or antibacterial soaps. If your skin becomes reddened/irritated stop using the CHG.  Do not shave (including legs and underarms) for at least  48 hours prior to first CHG shower. It is OK to shave your face.  Please follow these instructions carefully.   1. Shower the NIGHT BEFORE SURGERY and the MORNING OF SURGERY with CHG.   2. If you chose to wash your hair, wash your hair first as usual with your normal shampoo.  3. After you shampoo, rinse your hair and body thoroughly to remove the shampoo.  4. Use CHG as you would any other liquid soap. You can apply CHG directly to the skin and wash gently with a scrungie or a clean washcloth.   5. Apply the CHG Soap to your body ONLY FROM THE NECK DOWN.  Do not use on open wounds or open sores. Avoid contact with your eyes, ears, mouth and genitals (private parts). Wash Face and genitals (private parts)  with your normal soap.  6. Wash thoroughly, paying special attention to the area where your surgery will be performed.  7. Thoroughly rinse your body with warm water from the neck down.  8. DO NOT shower/wash with your normal soap after using and rinsing off the CHG Soap.  9. Pat yourself dry with a CLEAN TOWEL.  10. Wear CLEAN PAJAMAS to bed the night before surgery, wear comfortable clothes the morning of surgery  11. Place CLEAN SHEETS on your bed the night of your first shower and DO NOT SLEEP WITH PETS.   Day of Surgery: Do not apply any deodorants/lotions. Please wear clean clothes to the hospital/surgery center.     Do not wear jewelry.  Do not wear lotions, powders, or colognes, or deodorant.  Men may shave face and neck.  Do not bring valuables to the hospital.  [facility] is not responsible for any belongings or valuables.  Please read over the following fact sheets that you were given. Pain Booklet, Coughing and Deep Breathing, MRSA Information and Surgical Site Infection Prevention

## 2018-04-29 NOTE — Progress Notes (Addendum)
PCP: Lorella Nimrod, MD  Cardiologist: pt denies  EKG: 04/30/18  Stress test: pt denies ever  ECHO: pt denies ever  Cardiac Cath: pt denies ever  Chest x-ray: pt denies past year, no recent respiratory infections/complications

## 2018-04-30 ENCOUNTER — Encounter (HOSPITAL_COMMUNITY): Payer: Self-pay

## 2018-04-30 ENCOUNTER — Other Ambulatory Visit: Payer: Self-pay

## 2018-04-30 ENCOUNTER — Encounter (HOSPITAL_COMMUNITY)
Admission: RE | Admit: 2018-04-30 | Discharge: 2018-04-30 | Disposition: A | Discharge status: Home / Self Care | Payer: BLUE CROSS/BLUE SHIELD | Source: Ambulatory Visit | Attending: Orthopaedic Surgery | Admitting: Orthopaedic Surgery

## 2018-04-30 DIAGNOSIS — Z01812 Encounter for preprocedural laboratory examination: Secondary | ICD-10-CM | POA: Insufficient documentation

## 2018-04-30 DIAGNOSIS — M1611 Unilateral primary osteoarthritis, right hip: Secondary | ICD-10-CM | POA: Diagnosis not present

## 2018-04-30 DIAGNOSIS — Z01810 Encounter for preprocedural cardiovascular examination: Secondary | ICD-10-CM | POA: Diagnosis present

## 2018-04-30 HISTORY — DX: Unspecified osteoarthritis, unspecified site: M19.90

## 2018-04-30 LAB — TYPE AND SCREEN
ABO/RH(D): O POS
ANTIBODY SCREEN: NEGATIVE

## 2018-04-30 LAB — PROTIME-INR
INR: 1.03
Prothrombin Time: 13.4 seconds (ref 11.4–15.2)

## 2018-04-30 LAB — COMPREHENSIVE METABOLIC PANEL
ALBUMIN: 4.2 g/dL (ref 3.5–5.0)
ALK PHOS: 77 U/L (ref 38–126)
ALT: 73 U/L — ABNORMAL HIGH (ref 17–63)
AST: 51 U/L — AB (ref 15–41)
Anion gap: 8 (ref 5–15)
BILIRUBIN TOTAL: 0.9 mg/dL (ref 0.3–1.2)
BUN: 15 mg/dL (ref 6–20)
CALCIUM: 9.8 mg/dL (ref 8.9–10.3)
CO2: 26 mmol/L (ref 22–32)
Chloride: 106 mmol/L (ref 101–111)
Creatinine, Ser: 0.83 mg/dL (ref 0.61–1.24)
GFR calc Af Amer: >60 mL/min (ref >60)
GFR calc non Af Amer: >60 mL/min (ref >60)
GLUCOSE: 103 mg/dL — AB (ref 65–99)
POTASSIUM: 4.3 mmol/L (ref 3.5–5.1)
SODIUM: 140 mmol/L (ref 135–145)
TOTAL PROTEIN: 7.5 g/dL (ref 6.5–8.1)

## 2018-04-30 LAB — CBC WITH DIFFERENTIAL/PLATELET
BASOS ABS: 0 10*3/uL (ref 0.0–0.1)
BASOS PCT: 0 %
EOS ABS: 0.1 10*3/uL (ref 0.0–0.7)
Eosinophils Relative: 2 %
HEMATOCRIT: 41.3 % (ref 39.0–52.0)
HEMOGLOBIN: 13.9 g/dL (ref 13.0–17.0)
Lymphocytes Relative: 46 %
Lymphs Abs: 4.5 10*3/uL — ABNORMAL HIGH (ref 0.7–4.0)
MCH: 31.7 pg (ref 26.0–34.0)
MCHC: 33.7 g/dL (ref 30.0–36.0)
MCV: 94.3 fL (ref 78.0–100.0)
Monocytes Absolute: 0.8 10*3/uL (ref 0.1–1.0)
Monocytes Relative: 8 %
NEUTROS ABS: 4.2 10*3/uL (ref 1.7–7.7)
NEUTROS PCT: 44 %
Platelets: 300 10*3/uL (ref 150–400)
RBC: 4.38 MIL/uL (ref 4.22–5.81)
RDW: 13.9 % (ref 11.5–15.5)
WBC: 9.6 10*3/uL (ref 4.0–10.5)

## 2018-04-30 LAB — APTT: APTT: 30 s (ref 24–36)

## 2018-04-30 LAB — SURGICAL PCR SCREEN
MRSA, PCR: NEGATIVE
STAPHYLOCOCCUS AUREUS: NEGATIVE

## 2018-04-30 LAB — ABO/RH: ABO/RH(D): O POS

## 2018-05-06 MED ORDER — TRANEXAMIC ACID 1000 MG/10ML IV SOLN
1000.0000 mg | INTRAVENOUS | Status: AC
  Administered 2018-05-07: 1000 mg via INTRAVENOUS
  Filled 2018-05-06: qty 1100

## 2018-05-06 MED ORDER — TRANEXAMIC ACID 1000 MG/10ML IV SOLN
2000.0000 mg | INTRAVENOUS | Status: AC
  Administered 2018-05-07: 2000 mg via TOPICAL
  Filled 2018-05-06: qty 20

## 2018-05-07 ENCOUNTER — Inpatient Hospital Stay (HOSPITAL_COMMUNITY): Payer: BLUE CROSS/BLUE SHIELD

## 2018-05-07 ENCOUNTER — Inpatient Hospital Stay (HOSPITAL_COMMUNITY): Payer: BLUE CROSS/BLUE SHIELD | Admitting: Certified Registered"

## 2018-05-07 ENCOUNTER — Encounter (HOSPITAL_COMMUNITY): Payer: Self-pay | Admitting: Urology

## 2018-05-07 ENCOUNTER — Encounter (HOSPITAL_COMMUNITY)
Admission: RE | Disposition: A | Discharge status: Home / Self Care | Payer: Self-pay | Source: Ambulatory Visit | Attending: Orthopaedic Surgery

## 2018-05-07 ENCOUNTER — Inpatient Hospital Stay (HOSPITAL_COMMUNITY)
Admission: RE | Admit: 2018-05-07 | Discharge: 2018-05-08 | DRG: 470 | Disposition: A | Discharge status: Home / Self Care | Payer: BLUE CROSS/BLUE SHIELD | Source: Ambulatory Visit | Attending: Orthopaedic Surgery | Admitting: Orthopaedic Surgery

## 2018-05-07 DIAGNOSIS — D62 Acute posthemorrhagic anemia: Secondary | ICD-10-CM | POA: Diagnosis not present

## 2018-05-07 DIAGNOSIS — K219 Gastro-esophageal reflux disease without esophagitis: Secondary | ICD-10-CM | POA: Diagnosis present

## 2018-05-07 DIAGNOSIS — Z8379 Family history of other diseases of the digestive system: Secondary | ICD-10-CM

## 2018-05-07 DIAGNOSIS — Z8249 Family history of ischemic heart disease and other diseases of the circulatory system: Secondary | ICD-10-CM

## 2018-05-07 DIAGNOSIS — Z794 Long term (current) use of insulin: Secondary | ICD-10-CM | POA: Diagnosis not present

## 2018-05-07 DIAGNOSIS — Z833 Family history of diabetes mellitus: Secondary | ICD-10-CM

## 2018-05-07 DIAGNOSIS — M109 Gout, unspecified: Secondary | ICD-10-CM | POA: Diagnosis present

## 2018-05-07 DIAGNOSIS — M879 Osteonecrosis, unspecified: Secondary | ICD-10-CM | POA: Diagnosis present

## 2018-05-07 DIAGNOSIS — I10 Essential (primary) hypertension: Secondary | ICD-10-CM | POA: Diagnosis present

## 2018-05-07 DIAGNOSIS — M1611 Unilateral primary osteoarthritis, right hip: Principal | ICD-10-CM | POA: Diagnosis present

## 2018-05-07 DIAGNOSIS — F17210 Nicotine dependence, cigarettes, uncomplicated: Secondary | ICD-10-CM | POA: Diagnosis present

## 2018-05-07 DIAGNOSIS — Z96649 Presence of unspecified artificial hip joint: Secondary | ICD-10-CM

## 2018-05-07 DIAGNOSIS — E119 Type 2 diabetes mellitus without complications: Secondary | ICD-10-CM | POA: Diagnosis present

## 2018-05-07 DIAGNOSIS — M87051 Idiopathic aseptic necrosis of right femur: Secondary | ICD-10-CM

## 2018-05-07 HISTORY — DX: Other chronic pain: G89.29

## 2018-05-07 HISTORY — DX: Gastro-esophageal reflux disease without esophagitis: K21.9

## 2018-05-07 HISTORY — DX: Low back pain: M54.5

## 2018-05-07 HISTORY — PX: TOTAL HIP ARTHROPLASTY: SHX124

## 2018-05-07 HISTORY — DX: Other seasonal allergic rhinitis: J30.2

## 2018-05-07 HISTORY — DX: Low back pain, unspecified: M54.50

## 2018-05-07 LAB — GLUCOSE, CAPILLARY
Glucose-Capillary: 111 mg/dL — ABNORMAL HIGH (ref 65–99)
Glucose-Capillary: 169 mg/dL — ABNORMAL HIGH (ref 65–99)

## 2018-05-07 SURGERY — ARTHROPLASTY, HIP, TOTAL, ANTERIOR APPROACH
Anesthesia: Spinal | Site: Hip | Laterality: Right

## 2018-05-07 MED ORDER — ONDANSETRON HCL 4 MG PO TABS: 4.0000 mg | ORAL_TABLET | Freq: Three times a day (TID) | ORAL | 0 refills | Status: DC | PRN

## 2018-05-07 MED ORDER — BUPIVACAINE HCL (PF) 0.5 % IJ SOLN
INTRAMUSCULAR | Status: DC | PRN
  Administered 2018-05-07: 3 mL via INTRATHECAL

## 2018-05-07 MED ORDER — KETOROLAC TROMETHAMINE 15 MG/ML IJ SOLN
INTRAMUSCULAR | Status: AC
  Filled 2018-05-07: qty 1

## 2018-05-07 MED ORDER — METHOCARBAMOL 750 MG PO TABS: 750.0000 mg | ORAL_TABLET | Freq: Two times a day (BID) | ORAL | 0 refills | Status: DC | PRN

## 2018-05-07 MED ORDER — CHLORHEXIDINE GLUCONATE 4 % EX LIQD: 60.0000 mL | Freq: Once | CUTANEOUS | Status: DC

## 2018-05-07 MED ORDER — INSULIN ASPART 100 UNIT/ML ~~LOC~~ SOLN: 0.0000 [IU] | Freq: Every day | SUBCUTANEOUS | Status: DC

## 2018-05-07 MED ORDER — HYDROCODONE-ACETAMINOPHEN 5-325 MG PO TABS: 1.0000 | ORAL_TABLET | ORAL | Status: DC | PRN

## 2018-05-07 MED ORDER — MIDAZOLAM HCL 5 MG/5ML IJ SOLN
INTRAMUSCULAR | Status: DC | PRN
  Administered 2018-05-07: 2 mg via INTRAVENOUS

## 2018-05-07 MED ORDER — ALLOPURINOL 100 MG PO TABS
100.0000 mg | ORAL_TABLET | Freq: Every day | ORAL | Status: DC
  Administered 2018-05-08: 100 mg via ORAL
  Filled 2018-05-07: qty 1

## 2018-05-07 MED ORDER — VANCOMYCIN HCL 1000 MG IV SOLR
INTRAVENOUS | Status: AC | PRN
  Administered 2018-05-07: 1 g via INTRAVENOUS

## 2018-05-07 MED ORDER — METHOCARBAMOL 500 MG PO TABS
ORAL_TABLET | ORAL | Status: AC
  Filled 2018-05-07: qty 1

## 2018-05-07 MED ORDER — OXYCODONE HCL ER 10 MG PO T12A: 10.0000 mg | EXTENDED_RELEASE_TABLET | Freq: Two times a day (BID) | ORAL | 0 refills | Status: AC

## 2018-05-07 MED ORDER — MEPERIDINE HCL 50 MG/ML IJ SOLN: 6.2500 mg | INTRAMUSCULAR | Status: DC | PRN

## 2018-05-07 MED ORDER — MAGNESIUM CITRATE PO SOLN: 1.0000 | Freq: Once | ORAL | Status: DC | PRN

## 2018-05-07 MED ORDER — POLYETHYLENE GLYCOL 3350 17 G PO PACK
17.0000 g | PACK | Freq: Every day | ORAL | Status: DC | PRN
  Filled 2018-05-07: qty 1

## 2018-05-07 MED ORDER — PHENYLEPHRINE 40 MCG/ML (10ML) SYRINGE FOR IV PUSH (FOR BLOOD PRESSURE SUPPORT)
PREFILLED_SYRINGE | INTRAVENOUS | Status: DC | PRN
  Administered 2018-05-07 (×2): 80 ug via INTRAVENOUS
  Administered 2018-05-07: 60 ug via INTRAVENOUS

## 2018-05-07 MED ORDER — SENNOSIDES-DOCUSATE SODIUM 8.6-50 MG PO TABS: 1.0000 | ORAL_TABLET | Freq: Every evening | ORAL | 1 refills | Status: DC | PRN

## 2018-05-07 MED ORDER — KETOROLAC TROMETHAMINE 15 MG/ML IJ SOLN
30.0000 mg | Freq: Four times a day (QID) | INTRAMUSCULAR | Status: AC
  Administered 2018-05-07 – 2018-05-08 (×4): 30 mg via INTRAVENOUS
  Filled 2018-05-07 (×3): qty 2

## 2018-05-07 MED ORDER — 0.9 % SODIUM CHLORIDE (POUR BTL) OPTIME
TOPICAL | Status: DC | PRN
  Administered 2018-05-07: 1000 mL

## 2018-05-07 MED ORDER — CELECOXIB 200 MG PO CAPS
200.0000 mg | ORAL_CAPSULE | Freq: Two times a day (BID) | ORAL | Status: DC
  Administered 2018-05-07 – 2018-05-08 (×2): 200 mg via ORAL
  Filled 2018-05-07 (×2): qty 1

## 2018-05-07 MED ORDER — DIPHENHYDRAMINE HCL 12.5 MG/5ML PO ELIX: 25.0000 mg | ORAL_SOLUTION | ORAL | Status: DC | PRN

## 2018-05-07 MED ORDER — HYDROCODONE-ACETAMINOPHEN 7.5-325 MG PO TABS: 1.0000 | ORAL_TABLET | Freq: Four times a day (QID) | ORAL | 0 refills | Status: DC | PRN

## 2018-05-07 MED ORDER — ONDANSETRON HCL 4 MG/2ML IJ SOLN
INTRAMUSCULAR | Status: DC | PRN
  Administered 2018-05-07: 4 mg via INTRAVENOUS

## 2018-05-07 MED ORDER — ONDANSETRON HCL 4 MG/2ML IJ SOLN
INTRAMUSCULAR | Status: AC
  Filled 2018-05-07: qty 2

## 2018-05-07 MED ORDER — ONDANSETRON HCL 4 MG/2ML IJ SOLN: 4.0000 mg | Freq: Once | INTRAMUSCULAR | Status: DC | PRN

## 2018-05-07 MED ORDER — VANCOMYCIN HCL 500 MG IV SOLR
INTRAVENOUS | Status: AC
  Filled 2018-05-07: qty 500

## 2018-05-07 MED ORDER — MENTHOL 3 MG MT LOZG: 1.0000 | LOZENGE | OROMUCOSAL | Status: DC | PRN

## 2018-05-07 MED ORDER — HYDROMORPHONE HCL 2 MG/ML IJ SOLN
INTRAMUSCULAR | Status: AC
  Filled 2018-05-07: qty 1

## 2018-05-07 MED ORDER — METHOCARBAMOL 1000 MG/10ML IJ SOLN
500.0000 mg | Freq: Four times a day (QID) | INTRAVENOUS | Status: DC | PRN
  Filled 2018-05-07: qty 5

## 2018-05-07 MED ORDER — FENTANYL CITRATE (PF) 250 MCG/5ML IJ SOLN
INTRAMUSCULAR | Status: DC | PRN
  Administered 2018-05-07: 100 ug via INTRAVENOUS

## 2018-05-07 MED ORDER — DEXAMETHASONE SODIUM PHOSPHATE 10 MG/ML IJ SOLN
10.0000 mg | Freq: Once | INTRAMUSCULAR | Status: AC
  Administered 2018-05-08: 10 mg via INTRAVENOUS
  Filled 2018-05-07: qty 1

## 2018-05-07 MED ORDER — METHOCARBAMOL 500 MG PO TABS
500.0000 mg | ORAL_TABLET | Freq: Four times a day (QID) | ORAL | Status: DC | PRN
  Administered 2018-05-07 – 2018-05-08 (×3): 500 mg via ORAL
  Filled 2018-05-07 (×2): qty 1

## 2018-05-07 MED ORDER — ACETAMINOPHEN 325 MG PO TABS: 325.0000 mg | ORAL_TABLET | Freq: Four times a day (QID) | ORAL | Status: DC | PRN

## 2018-05-07 MED ORDER — ONDANSETRON HCL 4 MG PO TABS: 4.0000 mg | ORAL_TABLET | Freq: Four times a day (QID) | ORAL | Status: DC | PRN

## 2018-05-07 MED ORDER — ASPIRIN EC 81 MG PO TBEC: 81.0000 mg | DELAYED_RELEASE_TABLET | Freq: Two times a day (BID) | ORAL | 0 refills | Status: DC

## 2018-05-07 MED ORDER — SORBITOL 70 % SOLN: 30.0000 mL | Freq: Every day | Status: DC | PRN

## 2018-05-07 MED ORDER — PROPOFOL 10 MG/ML IV BOLUS
INTRAVENOUS | Status: DC | PRN
  Administered 2018-05-07: 20 mg via INTRAVENOUS
  Administered 2018-05-07: 30 mg via INTRAVENOUS

## 2018-05-07 MED ORDER — ONDANSETRON HCL 4 MG/2ML IJ SOLN: 4.0000 mg | Freq: Four times a day (QID) | INTRAMUSCULAR | Status: DC | PRN

## 2018-05-07 MED ORDER — DOCUSATE SODIUM 100 MG PO CAPS
100.0000 mg | ORAL_CAPSULE | Freq: Two times a day (BID) | ORAL | Status: DC
  Administered 2018-05-07 – 2018-05-08 (×2): 100 mg via ORAL
  Filled 2018-05-07 (×2): qty 1

## 2018-05-07 MED ORDER — METOCLOPRAMIDE HCL 5 MG PO TABS: 5.0000 mg | ORAL_TABLET | Freq: Three times a day (TID) | ORAL | Status: DC | PRN

## 2018-05-07 MED ORDER — METOCLOPRAMIDE HCL 5 MG/ML IJ SOLN: 5.0000 mg | Freq: Three times a day (TID) | INTRAMUSCULAR | Status: DC | PRN

## 2018-05-07 MED ORDER — TRANEXAMIC ACID 1000 MG/10ML IV SOLN
1000.0000 mg | Freq: Once | INTRAVENOUS | Status: AC
  Administered 2018-05-07: 1000 mg via INTRAVENOUS
  Filled 2018-05-07: qty 10

## 2018-05-07 MED ORDER — PROMETHAZINE HCL 25 MG PO TABS: 25.0000 mg | ORAL_TABLET | Freq: Four times a day (QID) | ORAL | 1 refills | Status: DC | PRN

## 2018-05-07 MED ORDER — PROPOFOL 500 MG/50ML IV EMUL
INTRAVENOUS | Status: DC | PRN
  Administered 2018-05-07: 100 ug/kg/min via INTRAVENOUS

## 2018-05-07 MED ORDER — PHENOL 1.4 % MT LIQD: 1.0000 | OROMUCOSAL | Status: DC | PRN

## 2018-05-07 MED ORDER — CEFAZOLIN SODIUM-DEXTROSE 2-4 GM/100ML-% IV SOLN
INTRAVENOUS | Status: AC
  Filled 2018-05-07: qty 100

## 2018-05-07 MED ORDER — CEFAZOLIN SODIUM-DEXTROSE 2-4 GM/100ML-% IV SOLN
2.0000 g | INTRAVENOUS | Status: AC
  Administered 2018-05-07: 2 g via INTRAVENOUS

## 2018-05-07 MED ORDER — HYDROCODONE-ACETAMINOPHEN 7.5-325 MG PO TABS
1.0000 | ORAL_TABLET | ORAL | Status: DC | PRN
  Administered 2018-05-07 – 2018-05-08 (×4): 2 via ORAL
  Filled 2018-05-07 (×3): qty 2

## 2018-05-07 MED ORDER — LACTATED RINGERS IV SOLN: INTRAVENOUS | Status: DC

## 2018-05-07 MED ORDER — MORPHINE SULFATE (PF) 2 MG/ML IV SOLN
0.5000 mg | INTRAVENOUS | Status: DC | PRN
  Administered 2018-05-08 (×2): 1 mg via INTRAVENOUS
  Filled 2018-05-07 (×2): qty 1

## 2018-05-07 MED ORDER — ALUM & MAG HYDROXIDE-SIMETH 200-200-20 MG/5ML PO SUSP: 30.0000 mL | ORAL | Status: DC | PRN

## 2018-05-07 MED ORDER — INSULIN ASPART 100 UNIT/ML ~~LOC~~ SOLN: 0.0000 [IU] | Freq: Three times a day (TID) | SUBCUTANEOUS | Status: DC

## 2018-05-07 MED ORDER — SODIUM CHLORIDE 0.9 % IR SOLN
Status: DC | PRN
  Administered 2018-05-07: 3000 mL

## 2018-05-07 MED ORDER — LOSARTAN POTASSIUM 50 MG PO TABS
100.0000 mg | ORAL_TABLET | Freq: Every day | ORAL | Status: DC
  Administered 2018-05-08: 100 mg via ORAL
  Filled 2018-05-07: qty 2

## 2018-05-07 MED ORDER — CEFAZOLIN SODIUM-DEXTROSE 2-4 GM/100ML-% IV SOLN
2.0000 g | Freq: Four times a day (QID) | INTRAVENOUS | Status: AC
  Administered 2018-05-07 (×2): 2 g via INTRAVENOUS
  Filled 2018-05-07 (×2): qty 100

## 2018-05-07 MED ORDER — SODIUM CHLORIDE 0.9 % IV SOLN
INTRAVENOUS | Status: DC
  Administered 2018-05-07: 21:00:00 via INTRAVENOUS

## 2018-05-07 MED ORDER — FENTANYL CITRATE (PF) 250 MCG/5ML IJ SOLN
INTRAMUSCULAR | Status: AC
  Filled 2018-05-07: qty 5

## 2018-05-07 MED ORDER — PHENYLEPHRINE 40 MCG/ML (10ML) SYRINGE FOR IV PUSH (FOR BLOOD PRESSURE SUPPORT)
PREFILLED_SYRINGE | INTRAVENOUS | Status: AC
  Filled 2018-05-07: qty 10

## 2018-05-07 MED ORDER — HYDROCODONE-ACETAMINOPHEN 7.5-325 MG PO TABS
ORAL_TABLET | ORAL | Status: AC
  Filled 2018-05-07: qty 2

## 2018-05-07 MED ORDER — PROPOFOL 10 MG/ML IV BOLUS
INTRAVENOUS | Status: AC
  Filled 2018-05-07: qty 20

## 2018-05-07 MED ORDER — MIDAZOLAM HCL 2 MG/2ML IJ SOLN
INTRAMUSCULAR | Status: AC
  Filled 2018-05-07: qty 2

## 2018-05-07 MED ORDER — LACTATED RINGERS IV SOLN
INTRAVENOUS | Status: DC
  Administered 2018-05-07 (×3): via INTRAVENOUS

## 2018-05-07 MED ORDER — VANCOMYCIN HCL 1000 MG IV SOLR
INTRAVENOUS | Status: AC
  Filled 2018-05-07: qty 1000

## 2018-05-07 MED ORDER — ASPIRIN 81 MG PO CHEW
81.0000 mg | CHEWABLE_TABLET | Freq: Two times a day (BID) | ORAL | Status: DC
  Administered 2018-05-07 – 2018-05-08 (×2): 81 mg via ORAL
  Filled 2018-05-07 (×2): qty 1

## 2018-05-07 MED ORDER — HYDROMORPHONE HCL 2 MG/ML IJ SOLN
0.2500 mg | INTRAMUSCULAR | Status: DC | PRN
  Administered 2018-05-07 (×2): 0.5 mg via INTRAVENOUS

## 2018-05-07 MED ORDER — ATORVASTATIN CALCIUM 40 MG PO TABS
40.0000 mg | ORAL_TABLET | Freq: Every day | ORAL | Status: DC
  Administered 2018-05-08: 40 mg via ORAL
  Filled 2018-05-07: qty 1

## 2018-05-07 MED ORDER — ALBUTEROL SULFATE (2.5 MG/3ML) 0.083% IN NEBU: 3.0000 mL | INHALATION_SOLUTION | Freq: Four times a day (QID) | RESPIRATORY_TRACT | Status: DC | PRN

## 2018-05-07 MED ORDER — PROPOFOL 1000 MG/100ML IV EMUL
INTRAVENOUS | Status: AC
  Filled 2018-05-07: qty 100

## 2018-05-07 SURGICAL SUPPLY — 47 items
BAG DECANTER FOR FLEXI CONT (MISCELLANEOUS) ×3 IMPLANT
CAPT HIP TOTAL 2 ×3 IMPLANT
CELLS DAT CNTRL 66122 CELL SVR (MISCELLANEOUS) IMPLANT
COVER SURGICAL LIGHT HANDLE (MISCELLANEOUS) ×3 IMPLANT
DRAPE C-ARM 42X72 X-RAY (DRAPES) ×3 IMPLANT
DRAPE POUCH INSTRU U-SHP 10X18 (DRAPES) ×3 IMPLANT
DRAPE STERI IOBAN 125X83 (DRAPES) ×3 IMPLANT
DRAPE U-SHAPE 47X51 STRL (DRAPES) ×6 IMPLANT
DRSG AQUACEL AG ADV 3.5X 6 (GAUZE/BANDAGES/DRESSINGS) ×3 IMPLANT
DRSG AQUACEL AG ADV 3.5X10 (GAUZE/BANDAGES/DRESSINGS) ×3 IMPLANT
DURAPREP 26ML APPLICATOR (WOUND CARE) ×3 IMPLANT
ELECT BLADE 4.0 EZ CLEAN MEGAD (MISCELLANEOUS) ×3
ELECT REM PT RETURN 9FT ADLT (ELECTROSURGICAL) ×3
ELECTRODE BLDE 4.0 EZ CLN MEGD (MISCELLANEOUS) ×1 IMPLANT
ELECTRODE REM PT RTRN 9FT ADLT (ELECTROSURGICAL) ×1 IMPLANT
GLOVE BIOGEL PI IND STRL 7.0 (GLOVE) ×1 IMPLANT
GLOVE BIOGEL PI INDICATOR 7.0 (GLOVE) ×2
GLOVE ECLIPSE 7.0 STRL STRAW (GLOVE) ×3 IMPLANT
GLOVE SKINSENSE NS SZ7.5 (GLOVE) ×2
GLOVE SKINSENSE STRL SZ7.5 (GLOVE) ×1 IMPLANT
GLOVE SURG SYN 7.5  E (GLOVE) ×4
GLOVE SURG SYN 7.5 E (GLOVE) ×2 IMPLANT
GOWN SRG XL XLNG 56XLVL 4 (GOWN DISPOSABLE) ×1 IMPLANT
GOWN STRL NON-REIN XL XLG LVL4 (GOWN DISPOSABLE) ×2
GOWN STRL REUS W/ TWL LRG LVL3 (GOWN DISPOSABLE) IMPLANT
GOWN STRL REUS W/TWL LRG LVL3 (GOWN DISPOSABLE)
HANDPIECE INTERPULSE COAX TIP (DISPOSABLE) ×2
HOOD PEEL AWAY FLYTE STAYCOOL (MISCELLANEOUS) ×6 IMPLANT
IV NS IRRIG 3000ML ARTHROMATIC (IV SOLUTION) ×3 IMPLANT
KIT BASIN OR (CUSTOM PROCEDURE TRAY) ×3 IMPLANT
MARKER SKIN DUAL TIP RULER LAB (MISCELLANEOUS) ×3 IMPLANT
NEEDLE SPNL 18GX3.5 QUINCKE PK (NEEDLE) ×3 IMPLANT
PACK TOTAL JOINT (CUSTOM PROCEDURE TRAY) ×3 IMPLANT
PACK UNIVERSAL I (CUSTOM PROCEDURE TRAY) ×3 IMPLANT
RTRCTR WOUND ALEXIS 18CM MED (MISCELLANEOUS)
SAW OSC TIP CART 19.5X105X1.3 (SAW) ×3 IMPLANT
SET HNDPC FAN SPRY TIP SCT (DISPOSABLE) ×1 IMPLANT
STAPLER VISISTAT 35W (STAPLE) IMPLANT
SUT ETHIBOND 2 V 37 (SUTURE) ×3 IMPLANT
SUT VIC AB 1 CT1 27 (SUTURE) ×2
SUT VIC AB 1 CT1 27XBRD ANBCTR (SUTURE) ×1 IMPLANT
SUT VIC AB 2-0 CT1 27 (SUTURE) ×6
SUT VIC AB 2-0 CT1 TAPERPNT 27 (SUTURE) ×3 IMPLANT
SYRINGE 60CC LL (MISCELLANEOUS) ×3 IMPLANT
TOWEL OR 17X26 10 PK STRL BLUE (TOWEL DISPOSABLE) ×3 IMPLANT
TRAY CATH 16FR W/PLASTIC CATH (SET/KITS/TRAYS/PACK) IMPLANT
YANKAUER SUCT BULB TIP NO VENT (SUCTIONS) ×3 IMPLANT

## 2018-05-07 NOTE — Discharge Instructions (Signed)

## 2018-05-07 NOTE — Anesthesia Preprocedure Evaluation (Signed)
Anesthesia Evaluation  Patient identified by MRN, date of birth, ID band Patient awake    Reviewed: Allergy & Precautions, NPO status , Patient's Chart, lab work & pertinent test results  Airway Mallampati: I  TM Distance: >3 FB Neck ROM: Full    Dental   Pulmonary Current Smoker,    Pulmonary exam normal        Cardiovascular hypertension, Pt. on medications Normal cardiovascular exam     Neuro/Psych    GI/Hepatic   Endo/Other  diabetes, Type 2, Oral Hypoglycemic Agents  Renal/GU      Musculoskeletal   Abdominal   Peds  Hematology   Anesthesia Other Findings   Reproductive/Obstetrics                             Anesthesia Physical Anesthesia Plan  ASA: II  Anesthesia Plan: Spinal   Post-op Pain Management:    Induction: Intravenous  PONV Risk Score and Plan: 0  Airway Management Planned: Simple Face Mask  Additional Equipment:   Intra-op Plan:   Post-operative Plan:   Informed Consent: I have reviewed the patients History and Physical, chart, labs and discussed the procedure including the risks, benefits and alternatives for the proposed anesthesia with the patient or authorized representative who has indicated his/her understanding and acceptance.     Plan Discussed with: CRNA and Surgeon  Anesthesia Plan Comments:         Anesthesia Quick Evaluation

## 2018-05-07 NOTE — Anesthesia Procedure Notes (Signed)
Spinal  Patient location during procedure: OR Start time: 05/07/2018 12:27 PM End time: 05/07/2018 12:30 PM Staffing Anesthesiologist: Lillia Abed, MD Performed: anesthesiologist  Preanesthetic Checklist Completed: patient identified, surgical consent, pre-op evaluation, timeout performed, IV checked, risks and benefits discussed and monitors and equipment checked Spinal Block Patient position: sitting Prep: DuraPrep Patient monitoring: blood pressure, continuous pulse ox, cardiac monitor and heart rate Approach: right paramedian Location: L3-4 Injection technique: single-shot Needle Needle type: Whitacre  Needle gauge: 24 G Needle length: 9 cm Needle insertion depth: 7 cm

## 2018-05-07 NOTE — Anesthesia Procedure Notes (Signed)
Procedure Name: MAC Date/Time: 05/07/2018 1:13 PM Performed by: Teressa Lower., CRNA Pre-anesthesia Checklist: Patient identified, Emergency Drugs available, Suction available, Patient being monitored and Timeout performed Oxygen Delivery Method: Simple face mask Ventilation: Oral airway inserted - appropriate to patient size

## 2018-05-07 NOTE — Anesthesia Postprocedure Evaluation (Signed)
Anesthesia Post Note  Patient: Jiles Harold  Procedure(s) Performed: RIGHT TOTAL HIP ARTHROPLASTY ANTERIOR APPROACH (Right Hip)     Patient location during evaluation: PACU Anesthesia Type: Spinal Level of consciousness: oriented and awake and alert Pain management: pain level controlled Vital Signs Assessment: post-procedure vital signs reviewed and stable Respiratory status: spontaneous breathing, respiratory function stable and patient connected to nasal cannula oxygen Cardiovascular status: blood pressure returned to baseline and stable Postop Assessment: no headache, no backache and no apparent nausea or vomiting Anesthetic complications: no    Last Vitals:  Vitals:   05/07/18 1645 05/07/18 1700  BP:    Pulse: (!) 54 (!) 54  Resp: 19 17  Temp:    SpO2: 99% 100%    Last Pain:  Vitals:   05/07/18 1700  TempSrc:   PainSc: [place] DAVID

## 2018-05-07 NOTE — Transfer of Care (Signed)
Immediate Anesthesia Transfer of Care Note  Patient: [name]  Procedure(s) Performed: RIGHT TOTAL HIP ARTHROPLASTY ANTERIOR APPROACH (Right Hip)  Patient Location: PACU  Anesthesia Type:Spinal  Level of Consciousness: awake, alert  and oriented  Airway & Oxygen Therapy: Patient Spontanous Breathing and Patient connected to nasal cannula oxygen  Post-op Assessment: Report given to RN and Post -op Vital signs reviewed and stable  Post vital signs: Reviewed and stable  Last Vitals:  Vitals Value Taken Time  BP 102/72 05/07/2018  2:53 PM  Temp    Pulse 68 05/07/2018  2:54 PM  Resp 13 05/07/2018  2:54 PM  SpO2 100 % 05/07/2018  2:54 PM  Vitals shown include unvalidated device data.  Last Pain:  Vitals:   05/07/18 1016  TempSrc:   PainSc: 7          Complications: No apparent anesthesia complications

## 2018-05-07 NOTE — Op Note (Signed)
RIGHT TOTAL HIP ARTHROPLASTY ANTERIOR APPROACH  Procedure Note Kordell Jafri   [id number]  Pre-op Diagnosis: right hip avascular necrosis     Post-op Diagnosis: same   Operative Procedures  1. Total hip replacement; Right hip; uncemented [id number]   Personnel  Surgeon(s): Leandrew Koyanagi, MD  Assist: Madalyn Rob, PA-C; necessary for the timely completion of procedure and due to complexity of procedure.   Anesthesia: spinal  Prosthesis: Depuy Acetabulum: Pinnacle 56 mm Femur: Corail KA 14 Head: 36 mm size: +12 Liner: +4 Bearing Type: ceramic on poly  Total Hip Arthroplasty (Anterior Approach) Op Note:  After informed consent was obtained and the operative extremity marked in the holding area, the patient was brought back to the operating room and placed supine on the HANA table. Next, the operative extremity was prepped and draped in normal sterile fashion. Surgical timeout occurred verifying patient identification, surgical site, surgical procedure and administration of antibiotics.  A modified anterior Smith-Peterson approach to the hip was performed, using the interval between tensor fascia lata and sartorius.  Dissection was carried bluntly down onto the anterior hip capsule. The lateral femoral circumflex vessels were identified and coagulated. A capsulotomy was performed and the capsular flaps tagged for later repair.  Fluoroscopy was utilized to prepare for the femoral neck cut. The neck osteotomy was performed. The femoral head was removed, the acetabular rim was cleared of soft tissue and attention was turned to reaming the acetabulum.  Sequential reaming was performed under fluoroscopic guidance. We reamed to a size 55 mm, and then impacted the acetabular shell. The liner was then placed after irrigation and attention turned to the femur.  After placing the femoral hook, the leg was taken to externally rotated, extended and adducted position taking care to perform  soft tissue releases to allow for adequate mobilization of the femur. Soft tissue was cleared from the shoulder of the greater trochanter and the hook elevator used to improve exposure of the proximal femur. Sequential broaching performed up to a size 14. Trial neck and head were placed. The leg was brought back up to neutral and the construct reduced. The position and sizing of components, offset and leg lengths were checked using fluoroscopy. Stability of the  construct was checked in extension and external rotation without any subluxation or impingement of prosthesis. We dislocated the prosthesis, dropped the leg back into position, removed trial components, and irrigated copiously. The final stem and head was then placed, the leg brought back up, the system reduced and fluoroscopy used to verify positioning.  We irrigated, obtained hemostasis and closed the capsule using #2 ethibond suture.  One gram of vancomycin powder was placed in the surgical bed. The fascia was closed with #1 vicryl plus, the deep fat layer was closed with 0 vicryl, the subcutaneous layers closed with 2.0 Vicryl Plus and the skin closed with 3.0 monocryl and steri strips. A sterile dressing was applied. The patient was awakened in the operating room and taken to recovery in stable condition.  All sponge, needle, and instrument counts were correct at the end of the case.   Position: supine  Complications: none.  Time Out: performed   Drains/Packing: none  Estimated blood loss: 150 cc  Returned to Recovery Room: in good condition.   Antibiotics: yes   Mechanical VTE (DVT) Prophylaxis: sequential compression devices, TED thigh-high  Chemical VTE (DVT) Prophylaxis: aspirin   Fluid Replacement: see anesthesia record  Specimens Removed: 1 to pathology   Sponge and  Instrument Count Correct? yes   PACU: portable radiograph - low AP   Admission: inpatient status  Plan/RTC: Return in 2 weeks for staple removal. Weight  Bearing/Load Lower Extremity: full  Hip precautions: none Suture Removal: 10-14 days  Betadine to incision twice daily once dressing is removed on POD#7  N. Eduard Roux, MD [place] [phone] 2:21 PM     Implant Name Type Inv. Item Serial No. Manufacturer Lot No. LRB No. Used  PINN SECTOR W/GRIP ACE CUP 56 - [id number] Hips PINN SECTOR W/GRIP ACE CUP 56  DEPUY SYNTHES [id number] Right 1  PINNACLE ALTRX PLUS 4 N [id number] - [id number] Hips PINNACLE ALTRX PLUS 4 N 36X56  DEPUY SYNTHES [id number] Right 1  STEM CORAIL KA14 - [id number] Stem STEM CORAIL KA14  DEPUY SYNTHES [id number] Right 1  HEAD CERAMIC BIO DELTA 36 - [id number] Hips HEAD CERAMIC BIO DELTA 36  DEPUY SYNTHES [id number] Right 1

## 2018-05-07 NOTE — H&P (Signed)
PREOPERATIVE H&P  Chief Complaint: right hip avascular necrosis  HPI: [name] is a 57 y.o. male who presents for surgical treatment of right hip avascular necrosis.  He denies any changes in medical history.  Past Medical History:  Diagnosis Date  . Allergy   . Arthritis   . Gout   . Hypertension    Past Surgical History:  Procedure Laterality Date  . ANKLE ARTHROSCOPY W/ OPEN REPAIR  2008   both ankles   Social History   Socioeconomic History  . Marital status: Married    Spouse name: Not on file  . Number of children: Not on file  . Years of education: 8  . Highest education level: Not on file  Occupational History  . Occupation: employed    Fish farm manager: UNEMPLOYED    Comment: Marketing executive  Social Needs  . Financial resource strain: Not on file  . Food insecurity:    Worry: Not on file    Inability: Not on file  . Transportation needs:    Medical: Not on file    Non-medical: Not on file  Tobacco Use  . Smoking status: Current Some Day Smoker    Packs/day: 0.40    Years: 32.00    Pack years: 12.80    Types: Cigarettes  . Smokeless tobacco: Former Systems developer  . Tobacco comment: 1 pk per week   Substance and Sexual Activity  . Alcohol use: Not on file    Comment: 1 drink a day  . Drug use: No    Comment: Used cocaine about 15 years before.  Marland Kitchen Sexual activity: Not on file  Lifestyle  . Physical activity:    Days per week: Not on file    Minutes per session: Not on file  . Stress: Not on file  Relationships  . Social connections:    Talks on phone: Not on file    Gets together: Not on file    Attends religious service: Not on file    Active member of club or organization: Not on file    Attends meetings of clubs or organizations: Not on file    Relationship status: Not on file  Other Topics Concern  . Not on file  Social History Narrative   Patient lives in [place] with his wife and 3 daughters.   He has have a total of 6 daughters.   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   Family History  Problem Relation Age of Onset  . Heart disease Mother   . Diabetes Mother   . Liver disease Father   . Diabetes Sister   . Colon polyps Neg Hx   . Colon cancer Neg Hx   . Esophageal cancer Neg Hx   . Stomach cancer Neg Hx    No Known Allergies Prior to Admission medications   Medication Sig Start Date End Date Taking? Authorizing Provider  acetaminophen (TYLENOL) 325 MG tablet Take 650 mg by mouth every 6 (six) hours as needed for mild pain.    [provider]  albuterol (PROVENTIL HFA;VENTOLIN HFA) 108 (90 Base) MCG/ACT inhaler Inhale 2 puffs into the lungs every 6 (six) hours as needed for wheezing or shortness of breath. 08/08/17   Lorella Nimrod, MD  allopurinol (ZYLOPRIM) 100 MG tablet TAKE 1 TABLET BY MOUTH EVERY DAY 08/04/17   Lorella Nimrod, MD  APPLE CIDER VINEGAR PO Take 1 capsule by mouth daily.  [provider]  atorvastatin (LIPITOR) 40 MG tablet Take 1 tablet (40 mg total) by mouth daily. 08/08/17   Lorella Nimrod, MD  Blood Glucose Monitoring Suppl (RELION CONFIRM GLUCOSE MONITOR) W/DEVICE KIT 1 Units by Does not apply route once. 05/29/14   Francesca Oman, DO  diclofenac (VOLTAREN) 0.1 % ophthalmic solution Place 1 drop into the left eye 4 (four) times daily as needed. Patient not taking: Reported on 04/30/2018 12/05/16   Norman Herrlich, MD  diclofenac sodium (VOLTAREN) 1 % GEL Apply 4 g topically 4 (four) times daily. Patient not taking: Reported on 04/30/2018 11/29/16   Lorella Nimrod, MD  glucose blood (FREESTYLE TEST STRIPS) test strip Use as instructed 05/29/14   Francesca Oman, DO  indomethacin (INDOCIN) 50 MG capsule TAKE 1 CAPSULE THREE TIMES A DAY AS NEEDED FOR JOINT PAIN Patient not taking: Reported on 04/30/2018 02/05/17   Lorella Nimrod, MD  Insulin Syringe-Needle U-100 31G X 5/16" 1 ML MISC 1 Container by Does not apply route daily. Patient not taking: Reported on  04/30/2018 05/29/14   Francesca Oman, DO  losartan (COZAAR) 100 MG tablet Take 1 tablet (100 mg total) by mouth daily. 01/30/18   Lorella Nimrod, MD  naproxen (NAPROSYN) 500 MG tablet Take 1 tablet (500 mg total) by mouth 2 (two) times daily with a meal. Patient not taking: Reported on 04/30/2018 08/08/17 08/08/18  Lorella Nimrod, MD  nicotine polacrilex (NICORETTE) 2 MG gum Take 1 each (2 mg total) by mouth as needed for smoking cessation. Patient not taking: Reported on 04/30/2018 08/28/15   Loleta Chance, MD  traMADol (ULTRAM) 50 MG tablet Take 1-2 tablets (50-100 mg total) by mouth 3 (three) times daily as needed. Patient not taking: Reported on 04/30/2018 03/05/18   Leandrew Koyanagi, MD     Positive ROS: All other systems have been reviewed and were otherwise negative with the exception of those mentioned in the HPI and as above.  Physical Exam: General: Alert, no acute distress Cardiovascular: No pedal edema Respiratory: No cyanosis, no use of accessory musculature GI: abdomen soft Skin: No lesions in the area of chief complaint Neurologic: Sensation intact distally Psychiatric: Patient is competent for consent with normal mood and affect Lymphatic: no lymphedema  MUSCULOSKELETAL: exam stable  Assessment: right hip avascular necrosis  Plan: Plan for Procedure(s): RIGHT TOTAL HIP ARTHROPLASTY ANTERIOR APPROACH  The risks benefits and alternatives were discussed with the patient including but not limited to the risks of nonoperative treatment, versus surgical intervention including infection, bleeding, nerve injury,  blood clots, cardiopulmonary complications, morbidity, mortality, among others, and they were willing to proceed.   Preoperative templating of the joint replacement has been completed, documented, and submitted to the Operating Room personnel in order to optimize intra-operative equipment management.  Eduard Roux, MD   05/07/2018 7:45 AM

## 2018-05-07 NOTE — Progress Notes (Signed)
Report given to charge RN, patient to go upstairs after shift change at 1930.  Rowe Pavy, RN

## 2018-05-08 ENCOUNTER — Encounter (HOSPITAL_COMMUNITY): Payer: Self-pay | Admitting: Orthopaedic Surgery

## 2018-05-08 ENCOUNTER — Other Ambulatory Visit: Payer: Self-pay

## 2018-05-08 LAB — BASIC METABOLIC PANEL
ANION GAP: 9 (ref 5–15)
BUN: 12 mg/dL (ref 6–20)
CHLORIDE: 104 mmol/L (ref 101–111)
CO2: 26 mmol/L (ref 22–32)
Calcium: 8.9 mg/dL (ref 8.9–10.3)
Creatinine, Ser: 0.87 mg/dL (ref 0.61–1.24)
GFR calc non Af Amer: >60 mL/min (ref >60)
Glucose, Bld: 155 mg/dL — ABNORMAL HIGH (ref 65–99)
Potassium: 3.8 mmol/L (ref 3.5–5.1)
Sodium: 139 mmol/L (ref 135–145)

## 2018-05-08 LAB — CBC
HEMATOCRIT: 35.2 % — AB (ref 39.0–52.0)
HEMOGLOBIN: 11.5 g/dL — AB (ref 13.0–17.0)
MCH: 31 pg (ref 26.0–34.0)
MCHC: 32.7 g/dL (ref 30.0–36.0)
MCV: 94.9 fL (ref 78.0–100.0)
Platelets: 276 10*3/uL (ref 150–400)
RBC: 3.71 MIL/uL — ABNORMAL LOW (ref 4.22–5.81)
RDW: 14 % (ref 11.5–15.5)
WBC: 13.2 10*3/uL — AB (ref 4.0–10.5)

## 2018-05-08 LAB — GLUCOSE, CAPILLARY
GLUCOSE-CAPILLARY: 131 mg/dL — AB (ref 65–99)
GLUCOSE-CAPILLARY: 156 mg/dL — AB (ref 65–99)

## 2018-05-08 NOTE — Care Management Note (Signed)
Case Management Note  Patient Details  Name: Panagiotis Oelkers MRN: [id number] Date of Birth: 1961-02-24  Subjective/Objective: 57 yr old gentleman s/p right total hip arthroplasty.                   Action/Plan: Case manager spoke with patient concerning discharge plan and DME. Patient was preoperatively setup with [facility], no changes. He will have family support at discharge.    Expected Discharge Date:  05/08/18               Expected Discharge Plan:  [place]  In-House Referral:  NA  Discharge planning Services  CM Consult  Post Acute Care Choice:  Durable Medical Equipment, Home Health Choice offered to:  Patient  DME Arranged:  [place] rolling DME Agency:  [place]:  PT [place] Agency:  [facility] (formerly [facility]  Status of Service:  Completed, signed off  If discussed at [employer] of Stay Meetings, dates discussed:    Additional Comments:  Ninfa Meeker, RN 05/08/2018, 12:14 PM

## 2018-05-08 NOTE — Evaluation (Signed)
Physical Therapy Evaluation Patient Details Name: [name] MRN: [id number] DOB: [birth year] Today's Date: 05/08/2018   History of Present Illness  Pt is a 57 y/o male s/p R THA, direct anterior. PMH including but not limited to HTN and gout.  Clinical Impression  Pt presented supine in bed with HOB elevated, awake and willing to participate in therapy session. Prior to admission, pt reported that he was ambulating with use of SPC and was independent with ADLs. Pt lives in a two level house with five steps to enter. His wife will be able to provide 24/7 supervision/assistance for the first week upon d/c. Pt currently requires supervision for bed mobility, min guard for transfers and min guard to ambulate in hallway with RW. Plan for stair training at next session. Pt would continue to benefit from skilled physical therapy services at this time while admitted and after d/c to address the below listed limitations in order to improve overall safety and independence with functional mobility.     Follow Up Recommendations Home health PT;Supervision/Assistance - 24 hour    Equipment Recommendations  Rolling walker with 5" wheels    Recommendations for Other Services       Precautions / Restrictions Precautions Precautions: Fall Restrictions Weight Bearing Restrictions: No RLE Weight Bearing: Weight bearing as tolerated      Mobility  Bed Mobility Overal bed mobility: Needs Assistance Bed Mobility: Supine to Sit     Supine to sit: Supervision     General bed mobility comments: increased time and effort, supervision for safety  Transfers Overall transfer level: Needs assistance Equipment used: Rolling walker (2 wheeled) Transfers: Sit to/from Stand Sit to Stand: Min guard         General transfer comment: cueing for hand placement, min guard for safety  Ambulation/Gait Ambulation/Gait assistance: Min guard Ambulation Distance (Feet): 150 Feet Assistive device: Rolling  walker (2 wheeled) Gait Pattern/deviations: Step-to pattern;Step-through pattern;Decreased step length - right;Decreased step length - left;Decreased stride length;Decreased stance time - right;Decreased weight shift to right Gait velocity: decreased Gait velocity interpretation: <1.31 ft/sec, indicative of household ambulator General Gait Details: mild instability with RW but no overt LOB or need for physical assistance; pt initially using a step-to gait pattern but able to progress to step-through with practice  Stairs            Wheelchair Mobility    Modified Rankin (Stroke Patients Only)       Balance Overall balance assessment: Needs assistance Sitting-balance support: Feet supported Sitting balance-Leahy Scale: Good     Standing balance support: During functional activity;Single extremity supported Standing balance-Leahy Scale: Fair                               Pertinent Vitals/Pain Pain Assessment: Faces Faces Pain Scale: Hurts even more Pain Location: R hip Pain Descriptors / Indicators: Sore Pain Intervention(s): Monitored during session;Repositioned    Home Living Family/patient expects to be discharged to:: Private residence Living Arrangements: Spouse/significant other Available Help at Discharge: Family;Available 24 hours/day Type of Home: House Home Access: Stairs to enter Entrance Stairs-Rails: Psychiatric nurse of Steps: 5 Home Layout: Two level Home Equipment: Shower seat;Cane - single point      Prior Function Level of Independence: Independent with assistive device(s)         Comments: ambulating with SPC     Hand Dominance        Extremity/Trunk Assessment  Upper Extremity Assessment Upper Extremity Assessment: Overall WFL for tasks assessed    Lower Extremity Assessment Lower Extremity Assessment: RLE deficits/detail RLE Deficits / Details: pt with decreased sensation and decreased functional  strength secondary to post-op    Cervical / Trunk Assessment Cervical / Trunk Assessment: Normal  Communication   Communication: No difficulties  Cognition Arousal/Alertness: Awake/alert Behavior During Therapy: WFL for tasks assessed/performed Overall Cognitive Status: Within Functional Limits for tasks assessed                                        General Comments      Exercises     Assessment/Plan    PT Assessment Patient needs continued PT services  PT Problem List Decreased strength;Decreased activity tolerance;Decreased balance;Decreased mobility;Decreased coordination;Decreased range of motion;Decreased knowledge of use of DME;Decreased safety awareness;Decreased knowledge of precautions;Pain       PT Treatment Interventions DME instruction;Gait training;Stair training;Functional mobility training;Therapeutic activities;Therapeutic exercise;Neuromuscular re-education;Balance training;Patient/family education    PT Goals (Current goals can be found in the Care Plan section)  Acute Rehab PT Goals Patient Stated Goal: return home PT Goal Formulation: With patient Time For Goal Achievement: 05/22/18 Potential to Achieve Goals: Good    Frequency 7X/week   Barriers to discharge        Co-evaluation               AM-PAC PT "6 Clicks" Daily Activity  Outcome Measure Difficulty turning over in bed (including adjusting bedclothes, sheets and blankets)?: None Difficulty moving from lying on back to sitting on the side of the bed? : None Difficulty sitting down on and standing up from a chair with arms (e.g., wheelchair, bedside commode, etc,.)?: Unable Help needed moving to and from a bed to chair (including a wheelchair)?: A Little Help needed walking in hospital room?: A Little Help needed climbing 3-5 steps with a railing? : A Little 6 Click Score: 18    End of Session Equipment Utilized During Treatment: Gait belt Activity Tolerance:  Patient tolerated treatment well Patient left: with call bell/phone within reach;Other (comment)(pt sitting on toilet and told to use call bell when done) Nurse Communication: Mobility status;Other (comment)(pt left on toilet in bathroom, instructed to use call bell) PT Visit Diagnosis: Other abnormalities of gait and mobility (R26.89);Pain Pain - Right/Left: Right Pain - part of body: Hip    Time: 6812-7517 PT Time Calculation (min) (ACUTE ONLY): 20 min   Charges:   PT Evaluation $PT Eval Moderate Complexity: 1 Mod     PT G Codes:        [place], PT, DPT [phone]   [place] 05/08/2018, 10:56 AM

## 2018-05-08 NOTE — Progress Notes (Signed)
Patient discharge instructions and prescriptions have been given. Patient verbalized understanding. Patient is going home with a walker and family.

## 2018-05-08 NOTE — Progress Notes (Addendum)
Subjective: 1 Day Post-Op Procedure(s) (LRB): RIGHT TOTAL HIP ARTHROPLASTY ANTERIOR APPROACH (Right) Patient reports pain as moderate.  Doing well this am.  Objective: Vital signs in last 24 hours: Temp:  [96.5 F (35.8 C)-98.3 F (36.8 C)] 98.2 F (36.8 C) (05/10 0500) Pulse Rate:  [49-69] 66 (05/10 0500) Resp:  [10-19] 18 (05/10 0500) BP: (101-172)/(71-109) 119/74 (05/10 0500) SpO2:  [97 %-100 %] 100 % (05/10 0500) Weight:  [217 lb 4.8 oz (98.6 kg)] 217 lb 4.8 oz (98.6 kg) (05/09 1015)  Intake/Output from previous day: 05/09 0701 - 05/10 0700 In: 2195 [P.O.:120; I.V.:1875; IV Piggyback:200] Out: 1250 [Urine:1150; Blood:100] Intake/Output this shift: No intake/output data recorded.  Recent Labs    05/08/18 0446  HGB 11.5*   Recent Labs    05/08/18 0446  WBC 13.2*  RBC 3.71*  HCT 35.2*  PLT 276   Recent Labs    05/08/18 0446  NA 139  K 3.8  CL 104  CO2 26  BUN 12  CREATININE 0.87  GLUCOSE 155*  CALCIUM 8.9   No results for input(s): LABPT, INR in the last 72 hours.  Neurologically intact Neurovascular intact Sensation intact distally Intact pulses distally Dorsiflexion/Plantar flexion intact Incision: scant drainage No cellulitis present Compartment soft   Assessment/Plan: 1 Day Post-Op Procedure(s) (LRB): RIGHT TOTAL HIP ARTHROPLASTY ANTERIOR APPROACH (Right) Advance diet Up with therapy D/C IV fluids Discharge home with home health today vs tomorrow pending progress with PT WBAT RLE-no precautions ABLA-mild and stable Patient has a hx of DM, which resolved.  Blood sugars have been stable post-op.  Ok to d/c glycemic control order set.       Aundra Dubin 05/08/2018, 7:42 AM

## 2018-05-08 NOTE — Progress Notes (Signed)
Physical Therapy Treatment Patient Details Name: [name] MRN: [id number] DOB: 04/24/1961 Today's Date: 05/08/2018    History of Present Illness Pt is a 57 y/o male s/p R THA, direct anterior. PMH including but not limited to HTN and gout.    PT Comments    Pt making excellent progress with functional mobility and successfully completed stair training without difficulty. Plan is for pt to d/c home today and pt is cleared to go home from a PT perspective.   Pt would continue to benefit from skilled physical therapy services at this time while admitted and after d/c to address the below listed limitations in order to improve overall safety and independence with functional mobility.    Follow Up Recommendations  Home health PT;Supervision/Assistance - 24 hour     Equipment Recommendations  Rolling walker with 5" wheels    Recommendations for Other Services       Precautions / Restrictions Precautions Precautions: Fall Restrictions Weight Bearing Restrictions: Yes RLE Weight Bearing: Weight bearing as tolerated    Mobility  Bed Mobility Overal bed mobility: Needs Assistance Bed Mobility: Supine to Sit     Supine to sit: Supervision     General bed mobility comments: pt OOB in recliner chair upon arrival  Transfers Overall transfer level: Needs assistance Equipment used: None Transfers: Sit to/from Stand Sit to Stand: Min guard         General transfer comment: min guard for safety without an AD  Ambulation/Gait Ambulation/Gait assistance: Supervision Ambulation Distance (Feet): 500 Feet Assistive device: Rolling walker (2 wheeled) Gait Pattern/deviations: Step-through pattern;Decreased stride length Gait velocity: WFL Gait velocity interpretation: 1.31 - 2.62 ft/sec, indicative of limited community ambulator General Gait Details: no instability or LOB with RW, supervision for safety; pt with improved step-through pattern with equal step length  bilaterally   Stairs Stairs: Yes Stairs assistance: Min guard Stair Management: One rail Left;Step to pattern;Forwards Number of Stairs: 10 General stair comments: min guard for safety; cueing for technique; pt ascended with L LE leading and descended with R LE leading   Wheelchair Mobility    Modified Rankin (Stroke Patients Only)       Balance Overall balance assessment: Needs assistance Sitting-balance support: Feet supported Sitting balance-Leahy Scale: Good     Standing balance support: During functional activity;No upper extremity supported Standing balance-Leahy Scale: Fair                              Cognition Arousal/Alertness: Awake/alert Behavior During Therapy: WFL for tasks assessed/performed Overall Cognitive Status: Within Functional Limits for tasks assessed                                        Exercises      General Comments        Pertinent Vitals/Pain Pain Assessment: 0-10 Pain Score: 7  Faces Pain Scale: Hurts even more Pain Location: R hip Pain Descriptors / Indicators: Sore Pain Intervention(s): Monitored during session;Repositioned    Home Living Family/patient expects to be discharged to:: Private residence Living Arrangements: Spouse/significant other Available Help at Discharge: Family;Available 24 hours/day Type of Home: House Home Access: Stairs to enter Entrance Stairs-Rails: Right;Left Home Layout: Two level Home Equipment: Shower seat;Cane - single point      Prior Function Level of Independence: Independent with assistive device(s)  Comments: ambulating with SPC   PT Goals (current goals can now be found in the care plan section) Acute Rehab PT Goals Patient Stated Goal: return home PT Goal Formulation: With patient Time For Goal Achievement: 05/22/18 Potential to Achieve Goals: Good Progress towards PT goals: Progressing toward goals    Frequency    7X/week      PT  Plan Current plan remains appropriate    Co-evaluation              AM-PAC PT "6 Clicks" Daily Activity  Outcome Measure  Difficulty turning over in bed (including adjusting bedclothes, sheets and blankets)?: None Difficulty moving from lying on back to sitting on the side of the bed? : None Difficulty sitting down on and standing up from a chair with arms (e.g., wheelchair, bedside commode, etc,.)?: None Help needed moving to and from a bed to chair (including a wheelchair)?: None Help needed walking in hospital room?: None Help needed climbing 3-5 steps with a railing? : A Little 6 Click Score: 23    End of Session Equipment Utilized During Treatment: Gait belt Activity Tolerance: Patient tolerated treatment well Patient left: in chair;with call bell/phone within reach Nurse Communication: Mobility status PT Visit Diagnosis: Other abnormalities of gait and mobility (R26.89);Pain Pain - Right/Left: Right Pain - part of body: Hip     Time: 1217-1230 PT Time Calculation (min) (ACUTE ONLY): 13 min  Charges:  $Gait Training: 8-22 mins                    G Codes:       [place], [place], [place] [phone]    [place] 05/08/2018, 12:42 PM

## 2018-05-08 NOTE — Discharge Summary (Signed)
Patient ID: [name] MRN: [id number] DOB/AGE: [birth year] 57 y.o.  Admit date: 05/07/2018 Discharge date: 05/08/2018  Admission Diagnoses:  Active Problems:   History of hip replacement   Discharge Diagnoses:  Same  Past Medical History:  Diagnosis Date  . Arthritis   . Chronic lower back pain   . GERD (gastroesophageal reflux disease)   . Gout   . Hypertension   . Seasonal allergies     Surgeries: Procedure(s): RIGHT TOTAL HIP ARTHROPLASTY ANTERIOR APPROACH on 05/07/2018   Consultants:   Discharged Condition: Improved  Hospital Course: [name] is an 57 y.o. male who was admitted 05/07/2018 for operative treatment of primary localized osteoarthritis right hip. Patient has severe unremitting pain that affects sleep, daily activities, and work/hobbies. After pre-op clearance the patient was taken to the operating room on 05/07/2018 and underwent  Procedure(s): RIGHT TOTAL HIP ARTHROPLASTY ANTERIOR APPROACH.    Patient was given perioperative antibiotics:  Anti-infectives (From admission, onward)   Start     Dose/Rate Route Frequency Ordered Stop   05/07/18 1811  ceFAZolin (ANCEF) 2-4 GM/100ML-% IVPB    Note to Pharmacy:  Jake Bathe   : cabinet override      05/07/18 1811 05/07/18 1816   05/07/18 1630  ceFAZolin (ANCEF) IVPB 2g/100 mL premix     2 g 200 mL/hr over 30 Minutes Intravenous Every 6 hours 05/07/18 1628 05/08/18 1359   05/07/18 1405  vancomycin (VANCOCIN) 1 g in sodium chloride 0.9 % 250 mL IVPB     over 60 Minutes  Continuous PRN 05/07/18 1406 05/07/18 1405   05/07/18 1015  ceFAZolin (ANCEF) IVPB 2g/100 mL premix     2 g 200 mL/hr over 30 Minutes Intravenous On call to O.R. 05/07/18 1009 05/07/18 1249   05/07/18 1010  ceFAZolin (ANCEF) 2-4 GM/100ML-% IVPB    Note to Pharmacy:  Debbe Bales, Meredit: cabinet override      05/07/18 1010 05/07/18 1249       Patient was given sequential compression devices, early ambulation, and chemoprophylaxis to  prevent DVT.  Patient benefited maximally from hospital stay and there were no complications.    Recent vital signs:  Patient Vitals for the past 24 hrs:  BP Temp Temp src Pulse Resp SpO2 Height Weight  05/08/18 0500 119/74 98.2 F (36.8 C) Oral 66 18 100 % - -  05/07/18 1959 128/78 98.3 F (36.8 C) Oral 68 17 100 % - -  05/07/18 1900 - - - 66 15 100 % - -  05/07/18 1845 - - - 69 17 98 % - -  05/07/18 1840 126/73 - - 62 10 100 % - -  05/07/18 1830 - - - 68 16 97 % - -  05/07/18 1815 115/71 - - 64 12 99 % - -  05/07/18 1800 114/80 97.8 F (36.6 C) - 69 15 99 % - -  05/07/18 1745 119/82 - - (!) 58 17 100 % - -  05/07/18 1730 124/81 - - (!) 55 15 99 % - -  05/07/18 1715 - (!) 97.4 F (36.3 C) - (!) 55 13 99 % - -  05/07/18 1700 122/83 - - (!) 54 17 100 % - -  05/07/18 1645 113/82 - - (!) 54 19 99 % - -  05/07/18 1630 121/87 - - 65 15 100 % - -  05/07/18 1615 - - - (!) 54 15 100 % - -  05/07/18 1600 105/73 - - (!) 49 10 100 % - -  05/07/18 1545 108/76 - - (!) 51 19 100 % - -  05/07/18 1530 101/77 (!) 96.5 F (35.8 C) - (!) 50 10 97 % - -  05/07/18 1515 103/72 - - (!) 56 10 99 % - -  05/07/18 1500 102/72 (!) 96.7 F (35.9 C) - 65 14 100 % - -  05/07/18 1050 (!) 154/96 - - - - - - -  05/07/18 1016 (!) 172/109 - - - - - - -  05/07/18 1015 - 98.3 F (36.8 C) Oral 67 18 100 % '5\' 11"'$  (1.803 m) 217 lb 4.8 oz (98.6 kg)     Recent laboratory studies:  Recent Labs    05/08/18 0446  WBC 13.2*  HGB 11.5*  HCT 35.2*  PLT 276  NA 139  K 3.8  CL 104  CO2 26  BUN 12  CREATININE 0.87  GLUCOSE 155*  CALCIUM 8.9     Discharge Medications:   Allergies as of 05/08/2018   No Known Allergies     Medication List    TAKE these medications   acetaminophen 325 MG tablet Commonly known as:  TYLENOL Take 650 mg by mouth every 6 (six) hours as needed for mild pain.   albuterol 108 (90 Base) MCG/ACT inhaler Commonly known as:  PROVENTIL HFA;VENTOLIN HFA Inhale 2 puffs into the  lungs every 6 (six) hours as needed for wheezing or shortness of breath.   allopurinol 100 MG tablet Commonly known as:  ZYLOPRIM TAKE 1 TABLET BY MOUTH EVERY DAY   APPLE CIDER VINEGAR PO Take 1 capsule by mouth daily.   aspirin EC 81 MG tablet Take 1 tablet (81 mg total) by mouth 2 (two) times daily.   atorvastatin 40 MG tablet Commonly known as:  LIPITOR Take 1 tablet (40 mg total) by mouth daily.   diclofenac 0.1 % ophthalmic solution Commonly known as:  VOLTAREN Place 1 drop into the left eye 4 (four) times daily as needed.   diclofenac sodium 1 % Gel Commonly known as:  VOLTAREN Apply 4 g topically 4 (four) times daily.   glucose blood test strip Commonly known as:  FREESTYLE TEST STRIPS Use as instructed   HYDROcodone-acetaminophen 7.5-325 MG tablet Commonly known as:  NORCO Take 1-2 tablets by mouth every 6 (six) hours as needed for moderate pain.   indomethacin 50 MG capsule Commonly known as:  INDOCIN TAKE 1 CAPSULE THREE TIMES A DAY AS NEEDED FOR JOINT PAIN   Insulin Syringe-Needle U-100 31G X 5/16" 1 ML Misc 1 Container by Does not apply route daily.   losartan 100 MG tablet Commonly known as:  COZAAR Take 1 tablet (100 mg total) by mouth daily.   methocarbamol 750 MG tablet Commonly known as:  ROBAXIN Take 1 tablet (750 mg total) by mouth 2 (two) times daily as needed for muscle spasms.   naproxen 500 MG tablet Commonly known as:  NAPROSYN Take 1 tablet (500 mg total) by mouth 2 (two) times daily with a meal.   nicotine polacrilex 2 MG gum Commonly known as:  NICORETTE Take 1 each (2 mg total) by mouth as needed for smoking cessation.   ondansetron 4 MG tablet Commonly known as:  ZOFRAN Take 1-2 tablets (4-8 mg total) by mouth every 8 (eight) hours as needed for nausea or vomiting.   oxyCODONE 10 mg 12 hr tablet Commonly known as:  OXYCONTIN Take 1 tablet (10 mg total) by mouth every 12 (twelve) hours for 3 days.   promethazine  25 MG  tablet Commonly known as:  PHENERGAN Take 1 tablet (25 mg total) by mouth every 6 (six) hours as needed for nausea.   RELION CONFIRM GLUCOSE MONITOR w/Device Kit 1 Units by Does not apply route once.   senna-docusate 8.6-50 MG tablet Commonly known as:  SENOKOT S Take 1 tablet by mouth at bedtime as needed.   traMADol 50 MG tablet Commonly known as:  ULTRAM Take 1-2 tablets (50-100 mg total) by mouth 3 (three) times daily as needed.            Durable Medical Equipment  (From admission, onward)        Start     Ordered   05/07/18 1950  DME Walker rolling  Once    Question:  Patient needs a walker to treat with the following condition  Answer:  History of hip replacement   05/07/18 1949   05/07/18 1950  DME 3 n 1  Once     05/07/18 1949   05/07/18 1950  DME Bedside commode  Once    Question:  Patient needs a bedside commode to treat with the following condition  Answer:  History of hip replacement   05/07/18 1949      Diagnostic Studies: Dg Pelvis Portable  Result Date: 05/07/2018 CLINICAL DATA:  Status post right hip replacement EXAM: PORTABLE PELVIS 1-2 VIEWS COMPARISON:  03/05/2018 FINDINGS: Interval right hip replacement is noted. No acute bony or soft tissue abnormality is seen. IMPRESSION: Status post right hip replacement. Electronically Signed   By: Inez Catalina M.D.   On: 05/07/2018 17:42   Dg C-arm 1-60 Min  Result Date: 05/07/2018 CLINICAL DATA:  Right hip arthroplasty EXAM: OPERATIVE RIGHT HIP WITH PELVIS; DG C-ARM 61-120 MIN COMPARISON:  None. FLUOROSCOPY TIME:  Fluoroscopy Time:  40 seconds Radiation Exposure Index (if provided by the fluoroscopic device): Not available Number of Acquired Spot Images: 4 FINDINGS: Initial images demonstrate degenerative changes of the right hip joint. Subsequent images demonstrate right hip replacement in satisfactory position. No acute bony or soft tissue abnormality is seen. IMPRESSION: Right hip replacement. Electronically  Signed   By: Inez Catalina M.D.   On: 05/07/2018 17:40   Dg Hip Operative Unilat W Or W/o Pelvis Right  Result Date: 05/07/2018 CLINICAL DATA:  Right hip arthroplasty EXAM: OPERATIVE RIGHT HIP WITH PELVIS; DG C-ARM 61-120 MIN COMPARISON:  None. FLUOROSCOPY TIME:  Fluoroscopy Time:  40 seconds Radiation Exposure Index (if provided by the fluoroscopic device): Not available Number of Acquired Spot Images: 4 FINDINGS: Initial images demonstrate degenerative changes of the right hip joint. Subsequent images demonstrate right hip replacement in satisfactory position. No acute bony or soft tissue abnormality is seen. IMPRESSION: Right hip replacement. Electronically Signed   By: Inez Catalina M.D.   On: 05/07/2018 17:40    Disposition: Discharge disposition: 01-Home or Self Care         Follow-up Information    Leandrew Koyanagi, MD In 2 weeks.   Specialty:  Orthopedic Surgery Why:  For suture removal, For wound re-check Contact information: [place] [address] [phone]            Signed: Aundra Dubin 05/08/2018, 7:46 AM

## 2018-05-11 ENCOUNTER — Telehealth (INDEPENDENT_AMBULATORY_CARE_PROVIDER_SITE_OTHER): Payer: Self-pay | Admitting: Orthopaedic Surgery

## 2018-05-11 NOTE — Telephone Encounter (Signed)
See message.

## 2018-05-11 NOTE — Telephone Encounter (Signed)
[name]-(PT) with [facility] called needing verbal order for HHPT 1 wk 1 and 3 wk 2. The number to contact Verdis Frederickson is [phone]

## 2018-05-11 NOTE — Telephone Encounter (Signed)
Patient's  wife  Langley Gauss called asked if they can get some more bandages. She asked if she can get the bandages patient can take a bath with it on. Langley Gauss said patient had to remove because it got soaked and was oozing out the side of the bandage. There is a small drainage now. The number to contact Langley Gauss is [phone]

## 2018-05-11 NOTE — Telephone Encounter (Signed)
yes

## 2018-05-11 NOTE — Telephone Encounter (Signed)
Called Maria no answer, LMOM with approval on orders.

## 2018-05-11 NOTE — Telephone Encounter (Signed)
The water resistant bandages are only available at the hospital unfortunately.  I would recommend just putting a dry gauze dressing on it or if her daughter has access to the aquacel dressings, she can grab one from the hospital.  Whichever works better for them.

## 2018-05-11 NOTE — Telephone Encounter (Signed)
Okay on orders. 

## 2018-05-13 NOTE — Telephone Encounter (Signed)
Called patient's wife to advise. 

## 2018-05-19 ENCOUNTER — Ambulatory Visit (INDEPENDENT_AMBULATORY_CARE_PROVIDER_SITE_OTHER): Payer: BLUE CROSS/BLUE SHIELD | Admitting: Orthopaedic Surgery

## 2018-05-19 ENCOUNTER — Encounter (INDEPENDENT_AMBULATORY_CARE_PROVIDER_SITE_OTHER): Payer: Self-pay | Admitting: Orthopaedic Surgery

## 2018-05-19 DIAGNOSIS — M87051 Idiopathic aseptic necrosis of right femur: Secondary | ICD-10-CM

## 2018-05-19 MED ORDER — MUPIROCIN 2 % EX OINT: 1.0000 "application " | TOPICAL_OINTMENT | Freq: Two times a day (BID) | CUTANEOUS | 0 refills | Status: DC

## 2018-05-19 MED ORDER — SULFAMETHOXAZOLE-TRIMETHOPRIM 800-160 MG PO TABS: 1.0000 | ORAL_TABLET | Freq: Two times a day (BID) | ORAL | 0 refills | Status: DC

## 2018-05-19 NOTE — Progress Notes (Signed)
Post-Op Visit Note   Patient: [name]           Date of Birth: 1961-12-29           MRN: [id number] Visit Date: 05/19/2018 PCP: Lorella Nimrod, MD   Assessment & Plan:  Chief Complaint:  Chief Complaint  Patient presents with  . Right Hip - Routine Post Op    05/07/18 right total hip arthroplasty, anterior approach   Visit Diagnoses:  1. Avascular necrosis of bone of right hip ([name])     Plan: Dene is two-week status post right total hip replacement.  Overall he is doing well and progressing very well with physical therapy.  He has developed a seroma around the incision.  Denies any redness or increased pain or constitutional symptoms or pus.  He has some scant serous drainage from the incision.  The incision overall is healed.  He does have a large seroma which I aspirated and obtained 60 cc.  I am going to put him on 10 days of oral Bactrim.  Bactroban ointment to the incision for a week.  Continue with physical therapy but I do recommend aggressive icing and relative rest to let things calm down.  I will see him back in 4 weeks..  Patient instructed to follow-up sooner if he develops any worrisome signs or symptoms.  Procedure Note  Patient: [name]             Date of Birth: [birth year]           MRN: [id number]             Visit Date: 05/19/2018  Procedures: Visit Diagnoses: Avascular necrosis of bone of right hip [facility])   Procedure Note  Patient: [name]             Date of Birth: 08-May-1961           MRN: [id number]             Visit Date: 05/19/2018  Procedures: Visit Diagnoses: Avascular necrosis of bone of right hip ([name])  Large Joint Inj: R hip joint on 05/19/2018 11:16 AM Indications: pain Details: 22 G needle Aspirate: 60 mL bloody, blood-tinged and serous Outcome: tolerated well, no immediate complications Patient was prepped and draped in the usual sterile fashion.        Follow-Up Instructions: Return in about 1 month (around  06/16/2018).   Orders:  No orders of the defined types were placed in this encounter.  Meds ordered this encounter  Medications  . sulfamethoxazole-trimethoprim (BACTRIM DS,SEPTRA DS) 800-160 MG tablet    Sig: Take 1 tablet by mouth 2 (two) times daily.    Dispense:  20 tablet    Refill:  0  . mupirocin ointment (BACTROBAN) 2 %    Sig: Apply 1 application topically 2 (two) times daily.    Dispense:  22 g    Refill:  0    Imaging: No results found.  PMFS History: Patient Active Problem List   Diagnosis Date Noted  . History of hip replacement 05/07/2018  . Avascular necrosis of bone of right hip ([place]) 03/04/2018  . Asthma in adult 08/08/2017  . Corneal abrasion, left 12/05/2016  . Need for hepatitis C screening test 07/19/2016  . Alcohol use 07/19/2016  . Hyperlipidemia associated with type 2 diabetes mellitus ([place]) 07/19/2016  . Lower back pain 09/29/2014  . Type 2 diabetes mellitus with proteinuria ([place]) 05/25/2014  . Tobacco use disorder 08/31/2013  .  Gout 04/30/2013  . Essential hypertension, benign 04/30/2013   Past Medical History:  Diagnosis Date  . Arthritis   . Chronic lower back pain   . GERD (gastroesophageal reflux disease)   . Gout   . Hypertension   . Seasonal allergies     Family History  Problem Relation Age of Onset  . Heart disease Mother   . Diabetes Mother   . Liver disease Father   . Diabetes Sister   . Colon polyps Neg Hx   . Colon cancer Neg Hx   . Esophageal cancer Neg Hx   . Stomach cancer Neg Hx     Past Surgical History:  Procedure Laterality Date  . ANKLE ARTHROSCOPY W/ OPEN REPAIR Bilateral 2008  . JOINT REPLACEMENT    . TOTAL HIP ARTHROPLASTY Right 05/07/2018  . TOTAL HIP ARTHROPLASTY Right 05/07/2018   Procedure: RIGHT TOTAL HIP ARTHROPLASTY ANTERIOR APPROACH;  Surgeon: Leandrew Koyanagi, MD;  Location: [place];  Service: Orthopedics;  Laterality: Right;   Social History   Occupational History  . 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 Smoking status: Current Some Day Smoker    Packs/day: 0.10    Years: 40.00    Pack years: 4.00    Types: Cigarettes  . Smokeless tobacco: Former Systems developer  . Tobacco comment: 05/08/2018 "used chewing tobacco years ago"  Substance and Sexual Activity  . Alcohol use: Yes    Alcohol/week: 19.8 oz    Types: 12 Cans of beer, 21 Shots of liquor per week    Comment: "12 pack beer & 2 pints of vodka"  . Drug use: No    Comment: Used cocaine "in the 1990s; quit cold [place]" (05/07/2018)  . Sexual activity: Not Currently

## 2018-05-21 ENCOUNTER — Inpatient Hospital Stay (INDEPENDENT_AMBULATORY_CARE_PROVIDER_SITE_OTHER): Payer: BLUE CROSS/BLUE SHIELD | Admitting: Orthopaedic Surgery

## 2018-06-23 ENCOUNTER — Ambulatory Visit (INDEPENDENT_AMBULATORY_CARE_PROVIDER_SITE_OTHER): Payer: BLUE CROSS/BLUE SHIELD | Admitting: Orthopaedic Surgery

## 2018-06-23 ENCOUNTER — Ambulatory Visit (INDEPENDENT_AMBULATORY_CARE_PROVIDER_SITE_OTHER): Payer: BLUE CROSS/BLUE SHIELD

## 2018-06-23 ENCOUNTER — Encounter (INDEPENDENT_AMBULATORY_CARE_PROVIDER_SITE_OTHER): Payer: Self-pay | Admitting: Orthopaedic Surgery

## 2018-06-23 DIAGNOSIS — Z96641 Presence of right artificial hip joint: Secondary | ICD-10-CM

## 2018-06-23 NOTE — Progress Notes (Signed)
Post-Op Visit Note   Patient: [name]           Date of Birth: [birth year]           MRN: [id number] Visit Date: 06/23/2018 PCP: Lorella Nimrod, MD   Assessment & Plan:  Chief Complaint:  Chief Complaint  Patient presents with  . Right Hip - Pain, Follow-up   Visit Diagnoses:  1. History of total hip replacement, right     Plan: Patient is a pleasant 57 year old gentleman who presents to our clinic today 47 days status post right anterior total hip replacement, 05/07/2018.  He has finished home health PT and is continuing to do exercises on his own.  Doing well with this.  He does have some pain but this is when he overdoes it.  At his 2-week postop appointment, he had a significant seroma for which 60 cc was aspirated.  This is not recurred.  Examination of the right hip shows a well-healed surgical incision without evidence of infection, cellulitis or seroma.  Full hip flexion.  He is neurovascularly intact distally.  At this point, he will continue working on a home exercise program.  Follow-up with [place] in 6 weeks time.  Call with concerns or questions in the meantime.  Follow-Up Instructions: Return in about 6 weeks (around 08/04/2018).   Orders:  Orders Placed This Encounter  Procedures  . XR HIP UNILAT W OR W/O PELVIS 2-3 VIEWS RIGHT   No orders of the defined types were placed in this encounter.   Imaging: No results found.  PMFS History: Patient Active Problem List   Diagnosis Date Noted  . History of hip replacement 05/07/2018  . Avascular necrosis of bone of right hip ([place]) 03/04/2018  . Asthma in adult 08/08/2017  . Corneal abrasion, left 12/05/2016  . Need for hepatitis C screening test 07/19/2016  . Alcohol use 07/19/2016  . Hyperlipidemia associated with type 2 diabetes mellitus ([place]) 07/19/2016  . Lower back pain 09/29/2014  . Type 2 diabetes mellitus with proteinuria ([place]) 05/25/2014  . Tobacco use disorder 08/31/2013  . Gout 04/30/2013  . Essential  hypertension, benign 04/30/2013   Past Medical History:  Diagnosis Date  . Arthritis   . Chronic lower back pain   . GERD (gastroesophageal reflux disease)   . Gout   . Hypertension   . Seasonal allergies     Family History  Problem Relation Age of Onset  . Heart disease Mother   . Diabetes Mother   . Liver disease Father   . Diabetes Sister   . Colon polyps Neg Hx   . Colon cancer Neg Hx   . Esophageal cancer Neg Hx   . Stomach cancer Neg Hx     Past Surgical History:  Procedure Laterality Date  . ANKLE ARTHROSCOPY W/ OPEN REPAIR Bilateral 2008  . JOINT REPLACEMENT    . TOTAL HIP ARTHROPLASTY Right 05/07/2018  . TOTAL HIP ARTHROPLASTY Right 05/07/2018   Procedure: RIGHT TOTAL HIP ARTHROPLASTY ANTERIOR APPROACH;  Surgeon: Leandrew Koyanagi, MD;  Location: [place];  Service: Orthopedics;  Laterality: Right;   Social History   Occupational History  . 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 Smoking status: Current Some Day Smoker    Packs/day: 0.10    Years: 40.00    Pack years: 4.00    Types: Cigarettes  . Smokeless tobacco: Former Systems developer  . Tobacco comment: 05/08/2018 "used  chewing tobacco years ago"  Substance and Sexual Activity  . Alcohol use: Yes    Alcohol/week: 19.8 oz    Types: 12 Cans of beer, 21 Shots of liquor per week    Comment: "12 pack beer & 2 pints of vodka"  . Drug use: No    Comment: Used cocaine "in the 1990s; quit cold [place]" (05/07/2018)  . Sexual activity: Not Currently

## 2018-07-14 ENCOUNTER — Other Ambulatory Visit: Payer: Self-pay

## 2018-07-14 MED ORDER — ALLOPURINOL 100 MG PO TABS: 100.0000 mg | ORAL_TABLET | Freq: Every day | ORAL | 1 refills | Status: DC

## 2018-07-14 NOTE — Telephone Encounter (Signed)
Requesting Gout med to be filled. Please call pt back.

## 2018-07-20 ENCOUNTER — Other Ambulatory Visit: Payer: Self-pay | Admitting: Internal Medicine

## 2018-07-20 ENCOUNTER — Other Ambulatory Visit: Payer: Self-pay | Admitting: *Deleted

## 2018-07-20 DIAGNOSIS — M1A49X0 Other secondary chronic gout, multiple sites, without tophus (tophi): Secondary | ICD-10-CM

## 2018-07-20 MED ORDER — INDOMETHACIN 50 MG PO CAPS
50.0000 mg | ORAL_CAPSULE | Freq: Three times a day (TID) | ORAL | 2 refills | Status: DC | PRN
Start: 2018-07-20 — End: 2019-04-12

## 2018-07-20 NOTE — Telephone Encounter (Signed)
Needs refills on indomethacin 50mg  @ [facility], asked pharmacy called yesterday, nothing rececived. Pt contact # [phone]  Pt wife stated they called on Thursday, no return call

## 2018-07-20 NOTE — Telephone Encounter (Signed)
Sent request to pcp after speaking to pt's wife

## 2018-07-22 ENCOUNTER — Other Ambulatory Visit: Payer: Self-pay

## 2018-07-22 ENCOUNTER — Ambulatory Visit (INDEPENDENT_AMBULATORY_CARE_PROVIDER_SITE_OTHER): Payer: BLUE CROSS/BLUE SHIELD | Admitting: Internal Medicine

## 2018-07-22 ENCOUNTER — Encounter: Payer: Self-pay | Admitting: Internal Medicine

## 2018-07-22 ENCOUNTER — Ambulatory Visit (HOSPITAL_COMMUNITY)
Admission: RE | Admit: 2018-07-22 | Discharge: 2018-07-22 | Disposition: A | Discharge status: Home / Self Care | Payer: BLUE CROSS/BLUE SHIELD | Source: Ambulatory Visit | Attending: Oncology | Admitting: Oncology

## 2018-07-22 VITALS — BP 155/95 | HR 81 | Temp 98.8°F | Ht 71.0 in | Wt 228.2 lb

## 2018-07-22 DIAGNOSIS — J45909 Unspecified asthma, uncomplicated: Secondary | ICD-10-CM

## 2018-07-22 DIAGNOSIS — E119 Type 2 diabetes mellitus without complications: Secondary | ICD-10-CM | POA: Diagnosis not present

## 2018-07-22 DIAGNOSIS — Z96641 Presence of right artificial hip joint: Secondary | ICD-10-CM

## 2018-07-22 DIAGNOSIS — I10 Essential (primary) hypertension: Secondary | ICD-10-CM

## 2018-07-22 DIAGNOSIS — M109 Gout, unspecified: Secondary | ICD-10-CM

## 2018-07-22 DIAGNOSIS — J22 Unspecified acute lower respiratory infection: Secondary | ICD-10-CM | POA: Diagnosis not present

## 2018-07-22 DIAGNOSIS — F17200 Nicotine dependence, unspecified, uncomplicated: Secondary | ICD-10-CM | POA: Diagnosis not present

## 2018-07-22 DIAGNOSIS — Z720 Tobacco use: Secondary | ICD-10-CM

## 2018-07-22 DIAGNOSIS — E785 Hyperlipidemia, unspecified: Secondary | ICD-10-CM

## 2018-07-22 MED ORDER — AZITHROMYCIN 500 MG PO TABS: 500.0000 mg | ORAL_TABLET | Freq: Every day | ORAL | 0 refills | Status: AC

## 2018-07-22 MED ORDER — ALBUTEROL SULFATE 108 (90 BASE) MCG/ACT IN AEPB: 2.0000 | INHALATION_SPRAY | Freq: Four times a day (QID) | RESPIRATORY_TRACT | 0 refills | Status: DC | PRN

## 2018-07-22 MED ORDER — IPRATROPIUM BROMIDE HFA 17 MCG/ACT IN AERS: 2.0000 | INHALATION_SPRAY | Freq: Four times a day (QID) | RESPIRATORY_TRACT | 2 refills | Status: DC

## 2018-07-22 NOTE — Assessment & Plan Note (Addendum)
Acute. 57 year old male with a 40 year smoking history presenting with 4 days of worsening cough, sputum production, sob, wheezing. No fevers, chills, n/v/d. Differential includes COPD vs pneumonia. No record of PFTs but question underlying undiagnosed COPD given patient's long term smoking history and reports of baseline sob and wheezing. Exam pertinent for diffuse wheezes on posterior lung fields. No lower extremity edema.  Will treat as COPD exacerbation and order CXR to rule out pneumonia. Will not prescribe steroids as patient has history of ICU admission for steroid induced HHS vs DKA.  Plan: - counseled on smoking cessation. Patient is down to 1 cigarette/day and would like to quite on his own.  - chest x-ray  - azithromycin 500mg  daily for 3 days - albuterol 2 puffs q6h - ipratropium 2 puffs 4 times daily - patient will schedule f/u with PCP. Recommend PFTs once patient recovers from acute illness  ADDENDUM 07/22/18 4:41PM Chest x-ray normal, no evidence of pneumonia. Discussed with patient's wife. Continue treatment with azithromycin and inhalers. They were only able to afford one of the inhalers (probably ipratropium).

## 2018-07-22 NOTE — Progress Notes (Signed)
Internal Medicine Clinic Attending  I saw and evaluated the patient.  I personally confirmed the key portions of the history and exam documented by Dr. Vogel and I reviewed pertinent patient test results.  The assessment, diagnosis, and plan were formulated together and I agree with the documentation in the resident's note.  

## 2018-07-22 NOTE — Patient Instructions (Addendum)
It was nice seeing you today. Thank you for choosing [facility] for your Primary Care.   Please go upstairs for your chest x-ray. I will call you with the results.  Please pick up your antibiotic and two inhalers.    FOLLOW-UP INSTRUCTIONS When: 1 month with PCP For: chronic medical problems and lower respiratory infection  Please contact the clinic if you have any problems, or need to be seen sooner.

## 2018-07-22 NOTE — Progress Notes (Signed)
   CC: cold  HPI:  [name] is a 57 y.o. male with h/o HTN, asthma, Type 2 DM, HLD, right hip replacement, gout, tobacco use who presents today with complaint of cough for 3-4 days, productive of dark/yellow/brown sputum. Reports quarter size amount of sputum production with each cough. Did not one red spot in his sputum this morning. Associated symptoms include headache, chest soreness, increased sob, trouble sleeping due to cough. He has never felt like this before. Hot tea and cough drops provide minimal relief. He reports a 40 year smoking history and baseline sob and wheezing. Sick contacts include his 9yo and 9yo granddaughters who had a "cough." Asthma is on his PMH but patient denies every having asthma. Albuterol is on his med list but patient reports never getting it because it was too expensive. There are no PFTs or chest x-rays in his EMR.   Denies chest pain, n/v/d, fevers, chills.    Past Medical History:  Diagnosis Date  . Arthritis   . Chronic lower back pain   . GERD (gastroesophageal reflux disease)   . Gout   . Hypertension   . Seasonal allergies     Physical Exam:  Vitals:   07/22/18 1326  BP: (!) 155/95  Pulse: 81  Temp: 98.8 F (37.1 C)  TempSrc: Oral  SpO2: 98%  Weight: 228 lb 3.2 oz (103.5 kg)  Height: 5\' 11"  (1.803 m)   Gen: Well appearing, NAD CV: RRR, no murmurs Pulm: Normal effort, diffuse inspiratory and expiratory wheezing on posterior lung fields. Anterior lung fields CTA. No crackles.  Ext: Warm, no edema    Assessment & Plan:   See Encounters Tab for problem based charting.  Patient seen with Dr. Evette Doffing

## 2018-08-11 ENCOUNTER — Ambulatory Visit (INDEPENDENT_AMBULATORY_CARE_PROVIDER_SITE_OTHER): Payer: BLUE CROSS/BLUE SHIELD | Admitting: Orthopaedic Surgery

## 2018-08-15 ENCOUNTER — Other Ambulatory Visit: Payer: Self-pay | Admitting: Internal Medicine

## 2018-08-15 DIAGNOSIS — R809 Proteinuria, unspecified: Principal | ICD-10-CM

## 2018-08-15 DIAGNOSIS — E1129 Type 2 diabetes mellitus with other diabetic kidney complication: Secondary | ICD-10-CM

## 2018-08-15 DIAGNOSIS — I10 Essential (primary) hypertension: Secondary | ICD-10-CM

## 2018-08-17 NOTE — Telephone Encounter (Signed)
Needs refill on atorvastatin (LIPITOR) 40 MG tablet @ [facility], pt contact# [phone]

## 2018-08-18 ENCOUNTER — Ambulatory Visit (INDEPENDENT_AMBULATORY_CARE_PROVIDER_SITE_OTHER): Payer: BLUE CROSS/BLUE SHIELD | Admitting: Orthopaedic Surgery

## 2018-08-18 ENCOUNTER — Encounter (INDEPENDENT_AMBULATORY_CARE_PROVIDER_SITE_OTHER): Payer: Self-pay | Admitting: Orthopaedic Surgery

## 2018-08-18 DIAGNOSIS — Z96641 Presence of right artificial hip joint: Secondary | ICD-10-CM | POA: Diagnosis not present

## 2018-08-18 NOTE — Progress Notes (Signed)
Office Visit Note   Patient: [name]           Date of Birth: Aug 22, 1961           MRN: [id number] Visit Date: 08/18/2018              Requested by: Lorella Nimrod, MD [address] [place], [place] [address] PCP: Lorella Nimrod, MD   Assessment & Plan: Visit Diagnoses:  1. History of total hip replacement, right     Plan: From my standpoint he is doing very well.  I would like to recheck him at the 9-month mark with repeat standing AP pelvis and lateral hip on return.  Patient is encouraged and answered.  Dental prophylaxis was reinforced.  Follow-Up Instructions: Return in about 3 months (around 11/18/2018) for early november.   Orders:  No orders of the defined types were placed in this encounter.  No orders of the defined types were placed in this encounter.     Procedures: No procedures performed   Clinical Data: No additional findings.   Subjective: Chief Complaint  Patient presents with  . Right Hip - Follow-up, Routine Post Op    Patient is a little over 3 months status post right total hip replacement.  He is doing well.  He is back to work at [employer].  He has completed physical therapy.  He takes no pain medicines.  He is happy he had his hip replacement.   Review of Systems   Objective: Vital Signs: There were no vitals taken for this visit.  Physical Exam  Ortho Exam Surgical scar is fully healed.  Painless rotation hip.  Equal leg lengths. Specialty Comments:  No specialty comments available.  Imaging: No results found.   PMFS History: Patient Active Problem List   Diagnosis Date Noted  . Lower respiratory infection 07/22/2018  . History of hip replacement 05/07/2018  . Avascular necrosis of bone of right hip ([place]) 03/04/2018  . Asthma in adult 08/08/2017  . Corneal abrasion, left 12/05/2016  . Need for hepatitis C screening test 07/19/2016  . Alcohol use 07/19/2016  . Hyperlipidemia associated with type 2 diabetes mellitus ([place])  07/19/2016  . Lower back pain 09/29/2014  . Type 2 diabetes mellitus with proteinuria ([place]) 05/25/2014  . Tobacco use disorder 08/31/2013  . Gout 04/30/2013  . Essential hypertension, benign 04/30/2013   Past Medical History:  Diagnosis Date  . Arthritis   . Chronic lower back pain   . GERD (gastroesophageal reflux disease)   . Gout   . Hypertension   . Seasonal allergies     Family History  Problem Relation Age of Onset  . Heart disease Mother   . Diabetes Mother   . Liver disease Father   . Diabetes Sister   . Colon polyps Neg Hx   . Colon cancer Neg Hx   . Esophageal cancer Neg Hx   . Stomach cancer Neg Hx     Past Surgical History:  Procedure Laterality Date  . ANKLE ARTHROSCOPY W/ OPEN REPAIR Bilateral 2008  . JOINT REPLACEMENT    . TOTAL HIP ARTHROPLASTY Right 05/07/2018  . TOTAL HIP ARTHROPLASTY Right 05/07/2018   Procedure: RIGHT TOTAL HIP ARTHROPLASTY ANTERIOR APPROACH;  Surgeon: Leandrew Koyanagi, MD;  Location: [place];  Service: Orthopedics;  Laterality: Right;   Social History   Occupational History  . 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 Smoking  status: Current Some Day Smoker    Packs/day: 0.10    Years: 40.00    Pack years: 4.00    Types: Cigarettes  . Smokeless tobacco: Former Systems developer  . Tobacco comment: 05/08/2018 "used chewing tobacco years ago"  Substance and Sexual Activity  . Alcohol use: Yes    Alcohol/week: 33.0 standard drinks    Types: 12 Cans of beer, 21 Shots of liquor per week    Comment: "12 pack beer & 2 pints of vodka"  . Drug use: No    Comment: Used cocaine "in the 1990s; quit cold [place]" (05/07/2018)  . Sexual activity: Not Currently

## 2018-09-07 ENCOUNTER — Encounter (INDEPENDENT_AMBULATORY_CARE_PROVIDER_SITE_OTHER): Payer: Self-pay

## 2018-09-07 ENCOUNTER — Ambulatory Visit (INDEPENDENT_AMBULATORY_CARE_PROVIDER_SITE_OTHER): Payer: BLUE CROSS/BLUE SHIELD | Admitting: Internal Medicine

## 2018-09-07 ENCOUNTER — Encounter: Payer: Self-pay | Admitting: Internal Medicine

## 2018-09-07 VITALS — BP 144/90 | HR 74 | Temp 98.4°F | Wt 232.9 lb

## 2018-09-07 DIAGNOSIS — Z23 Encounter for immunization: Secondary | ICD-10-CM | POA: Diagnosis not present

## 2018-09-07 DIAGNOSIS — Z1211 Encounter for screening for malignant neoplasm of colon: Secondary | ICD-10-CM

## 2018-09-07 DIAGNOSIS — E1129 Type 2 diabetes mellitus with other diabetic kidney complication: Secondary | ICD-10-CM

## 2018-09-07 DIAGNOSIS — R809 Proteinuria, unspecified: Secondary | ICD-10-CM

## 2018-09-07 DIAGNOSIS — Z79899 Other long term (current) drug therapy: Secondary | ICD-10-CM

## 2018-09-07 DIAGNOSIS — E785 Hyperlipidemia, unspecified: Secondary | ICD-10-CM

## 2018-09-07 DIAGNOSIS — M109 Gout, unspecified: Secondary | ICD-10-CM

## 2018-09-07 DIAGNOSIS — I10 Essential (primary) hypertension: Secondary | ICD-10-CM

## 2018-09-07 DIAGNOSIS — E1169 Type 2 diabetes mellitus with other specified complication: Secondary | ICD-10-CM | POA: Diagnosis not present

## 2018-09-07 LAB — POCT GLYCOSYLATED HEMOGLOBIN (HGB A1C): Hemoglobin A1C: 7.4 % — AB (ref 4.0–5.6)

## 2018-09-07 LAB — GLUCOSE, CAPILLARY: Glucose-Capillary: 141 mg/dL — ABNORMAL HIGH (ref 70–99)

## 2018-09-07 MED ORDER — METFORMIN HCL ER 500 MG PO TB24: 1000.0000 mg | ORAL_TABLET | Freq: Every day | ORAL | 5 refills | Status: DC

## 2018-09-07 MED ORDER — LOSARTAN POTASSIUM 100 MG PO TABS
100.0000 mg | ORAL_TABLET | Freq: Every day | ORAL | 3 refills | Status: DC
Start: 2018-09-07 — End: 2019-04-12

## 2018-09-07 NOTE — Progress Notes (Signed)
   CC: For follow-up of her diabetes and hypertension.  HPI:  [name] is a 57 y.o. with past medical history significant for currently diet-controlled diabetes and hypertension came to the clinic for follow-up of his diabetes and and hypertension.  Patient had no complaints today.  See assessment and plan for his chronic conditions.  Past Medical History:  Diagnosis Date  . Arthritis   . Chronic lower back pain   . GERD (gastroesophageal reflux disease)   . Gout   . Hypertension   . Seasonal allergies    Review of Systems: Negative except mentioned in HPI.  Physical Exam:  Vitals:   09/07/18 1538  BP: (!) 144/90  Pulse: 74  Temp: 98.4 F (36.9 C)  TempSrc: Oral  SpO2: 99%  Weight: 232 lb 14.4 oz (105.6 kg)    General: Vital signs reviewed.  Patient is well-developed and well-nourished, in no acute distress and cooperative with exam.  Head: Normocephalic and atraumatic. Eyes: EOMI, conjunctivae normal, no scleral icterus.  Cardiovascular: RRR, S1 normal, S2 normal, no murmurs, gallops, or rubs. Pulmonary/Chest: Clear to auscultation bilaterally, no wheezes, rales, or rhonchi. Abdominal: Soft, non-tender, non-distended, BS +, no masses, organomegaly, or guarding present.  Extremities: No lower extremity edema bilaterally,  pulses symmetric and intact bilaterally. No cyanosis or clubbing. Neurological: A&O x3, Strength is normal and symmetric bilaterally, cranial nerve II-XII are grossly intact, no focal motor deficit, sensory intact to light touch bilaterally.  Skin: Warm, dry and intact. No rashes or erythema. Psychiatric: Normal mood and affect. speech and behavior is normal. Cognition and memory are normal.  Assessment & Plan:   See Encounters Tab for problem based charting.  Patient discussed with Dr. Rebeca Alert.

## 2018-09-07 NOTE — Patient Instructions (Addendum)
Thank you for visiting clinic today. As we discussed your A1c is high at 7.4 today that makes your diabetic again. We are starting you back on Metformin 1000 mg daily. Please watch your diet and exercise regularly. Your blood pressure was little elevated today, I am not making any changes at this time, please follow low-salt diet and exercise. I am providing you with some information regarding a DASH diet and a diabetic diet. We are checking your urine for proteins today. I am also giving you a referral for colonoscopy. You were provided with flu shot. We did offer an pneumonia shot-we can offer that again during your next follow-up visit. These follow-up in 71-month.   DASH Eating Plan DASH stands for "Dietary Approaches to Stop Hypertension." The DASH eating plan is a healthy eating plan that has been shown to reduce high blood pressure (hypertension). It may also reduce your risk for type 2 diabetes, heart disease, and stroke. The DASH eating plan may also help with weight loss. What are tips for following this plan? General guidelines  Avoid eating more than 2,300 mg (milligrams) of salt (sodium) a day. If you have hypertension, you may need to reduce your sodium intake to 1,500 mg a day.  Limit alcohol intake to no more than 1 drink a day for nonpregnant women and 2 drinks a day for men. One drink equals 12 oz of beer, 5 oz of wine, or 1 oz of hard liquor.  Work with your health care provider to maintain a healthy body weight or to lose weight. Ask what an ideal weight is for you.  Get at least 30 minutes of exercise that causes your heart to beat faster (aerobic exercise) most days of the week. Activities may include walking, swimming, or biking.  Work with your health care provider or diet and nutrition specialist (dietitian) to adjust your eating plan to your individual calorie needs. Reading food labels  Check food labels for the amount of sodium per serving. Choose foods with  less than 5 percent of the Daily Value of sodium. Generally, foods with less than 300 mg of sodium per serving fit into this eating plan.  To find whole grains, look for the word "whole" as the first word in the ingredient list. Shopping  Buy products labeled as "low-sodium" or "no salt added."  Buy fresh foods. Avoid canned foods and premade or frozen meals. Cooking  Avoid adding salt when cooking. Use salt-free seasonings or herbs instead of table salt or sea salt. Check with your health care provider or pharmacist before using salt substitutes.  Do not fry foods. Cook foods using healthy methods such as baking, boiling, grilling, and broiling instead.  Cook with heart-healthy oils, such as olive, canola, soybean, or sunflower oil. Meal planning   Eat a balanced diet that includes: ? 5 or more servings of fruits and vegetables each day. At each meal, try to fill half of your plate with fruits and vegetables. ? Up to 6-8 servings of whole grains each day. ? Less than 6 oz of lean meat, poultry, or fish each day. A 3-oz serving of meat is about the same size as a deck of cards. One egg equals 1 oz. ? 2 servings of low-fat dairy each day. ? A serving of nuts, seeds, or beans 5 times each week. ? Heart-healthy fats. Healthy fats called Omega-3 fatty acids are found in foods such as flaxseeds and coldwater fish, like sardines, salmon, and mackerel.  Limit how  much you eat of the following: ? Canned or prepackaged foods. ? Food that is high in trans fat, such as fried foods. ? Food that is high in saturated fat, such as fatty meat. ? Sweets, desserts, sugary drinks, and other foods with added sugar. ? Full-fat dairy products.  Do not salt foods before eating.  Try to eat at least 2 vegetarian meals each week.  Eat more home-cooked food and less restaurant, buffet, and fast food.  When eating at a restaurant, ask that your food be prepared with less salt or no salt, if  possible. What foods are recommended? The items listed may not be a complete list. Talk with your dietitian about what dietary choices are best for you. Grains Whole-grain or whole-wheat bread. Whole-grain or whole-wheat pasta. Brown rice. [place]. Bulgur. Whole-grain and low-sodium cereals. Pita bread. Low-fat, low-sodium crackers. Whole-wheat flour tortillas. Vegetables Fresh or frozen vegetables (raw, steamed, roasted, or grilled). Low-sodium or reduced-sodium tomato and vegetable juice. Low-sodium or reduced-sodium tomato sauce and tomato paste. Low-sodium or reduced-sodium canned vegetables. Fruits All fresh, dried, or frozen fruit. Canned fruit in natural juice (without added sugar). Meat and other protein foods Skinless chicken or [place]. Ground chicken or [place]. Pork with fat trimmed off. Fish and seafood. Egg whites. Dried beans, peas, or lentils. Unsalted nuts, nut butters, and seeds. Unsalted canned beans. Lean cuts of beef with fat trimmed off. Low-sodium, lean deli meat. Dairy Low-fat (1%) or fat-free (skim) milk. Fat-free, low-fat, or reduced-fat cheeses. Nonfat, low-sodium ricotta or cottage cheese. Low-fat or nonfat yogurt. Low-fat, low-sodium cheese. Fats and oils Soft margarine without trans fats. Vegetable oil. Low-fat, reduced-fat, or light mayonnaise and salad dressings (reduced-sodium). Canola, safflower, olive, soybean, and sunflower oils. Avocado. Seasoning and other foods Herbs. Spices. Seasoning mixes without salt. Unsalted popcorn and pretzels. Fat-free sweets. What foods are not recommended? The items listed may not be a complete list. Talk with your dietitian about what dietary choices are best for you. Grains Baked goods made with fat, such as croissants, muffins, or some breads. Dry pasta or rice meal packs. Vegetables Creamed or fried vegetables. Vegetables in a cheese sauce. Regular canned vegetables (not low-sodium or reduced-sodium). Regular canned  tomato sauce and paste (not low-sodium or reduced-sodium). Regular tomato and vegetable juice (not low-sodium or reduced-sodium). Angie Fava. Olives. Fruits Canned fruit in a light or heavy syrup. Fried fruit. Fruit in cream or butter sauce. Meat and other protein foods Fatty cuts of meat. Ribs. Fried meat. Berniece Salines. Sausage. Bologna and other processed lunch meats. Salami. Fatback. Hotdogs. Bratwurst. Salted nuts and seeds. Canned beans with added salt. Canned or smoked fish. Whole eggs or egg yolks. Chicken or [place] with skin. Dairy Whole or 2% milk, cream, and half-and-half. Whole or full-fat cream cheese. Whole-fat or sweetened yogurt. Full-fat cheese. Nondairy creamers. Whipped toppings. Processed cheese and cheese spreads. Fats and oils Butter. Stick margarine. Lard. Shortening. Ghee. Bacon fat. Tropical oils, such as coconut, palm kernel, or palm oil. Seasoning and other foods Salted popcorn and pretzels. Onion salt, garlic salt, seasoned salt, table salt, and sea salt. Worcestershire sauce. Tartar sauce. Barbecue sauce. Teriyaki sauce. Soy sauce, including reduced-sodium. Steak sauce. Canned and packaged gravies. Fish sauce. Oyster sauce. Cocktail sauce. Horseradish that you find on the shelf. Ketchup. Mustard. Meat flavorings and tenderizers. Bouillon cubes. Hot sauce and Tabasco sauce. Premade or packaged marinades. Premade or packaged taco seasonings. Relishes. Regular salad dressings. Where to find more information:  National Heart, Lung, and Blood Institute: https://wilson-eaton.com/  American Heart Association: www.heart.org Summary  The DASH eating plan is a healthy eating plan that has been shown to reduce high blood pressure (hypertension). It may also reduce your risk for type 2 diabetes, heart disease, and stroke.  With the DASH eating plan, you should limit salt (sodium) intake to 2,300 mg a day. If you have hypertension, you may need to reduce your sodium intake to 1,500 mg a day.  When  on the DASH eating plan, aim to eat more fresh fruits and vegetables, whole grains, lean proteins, low-fat dairy, and heart-healthy fats.  Work with your health care provider or diet and nutrition specialist (dietitian) to adjust your eating plan to your individual calorie needs. This information is not intended to replace advice given to you by your health care provider. Make sure you discuss any questions you have with your health care provider. Document Released: 12/05/2011 Document Revised: 12/09/2016 Document Reviewed: 12/09/2016 Elsevier Interactive Patient Education  2018 [employer].   Diabetes Mellitus and Nutrition When you have diabetes (diabetes mellitus), it is very important to have healthy eating habits because your blood sugar (glucose) levels are greatly affected by what you eat and drink. Eating healthy foods in the appropriate amounts, at about the same times every day, can help you:  Control your blood glucose.  Lower your risk of heart disease.  Improve your blood pressure.  Reach or maintain a healthy weight.  Every person with diabetes is different, and each person has different needs for a meal plan. Your health care provider may recommend that you work with a diet and nutrition specialist (dietitian) to make a meal plan that is best for you. Your meal plan may vary depending on factors such as:  The calories you need.  The medicines you take.  Your weight.  Your blood glucose, blood pressure, and cholesterol levels.  Your activity level.  Other health conditions you have, such as heart or kidney disease.  How do carbohydrates affect me? Carbohydrates affect your blood glucose level more than any other type of food. Eating carbohydrates naturally increases the amount of glucose in your blood. Carbohydrate counting is a method for keeping track of how many carbohydrates you eat. Counting carbohydrates is important to keep your blood glucose at a healthy  level, especially if you use insulin or take certain oral diabetes medicines. It is important to know how many carbohydrates you can safely have in each meal. This is different for every person. Your dietitian can help you calculate how many carbohydrates you should have at each meal and for snack. Foods that contain carbohydrates include:  Bread, cereal, rice, pasta, and crackers.  Potatoes and corn.  Peas, beans, and lentils.  Milk and yogurt.  Fruit and juice.  Desserts, such as cakes, cookies, ice cream, and candy.  How does alcohol affect me? Alcohol can cause a sudden decrease in blood glucose (hypoglycemia), especially if you use insulin or take certain oral diabetes medicines. Hypoglycemia can be a life-threatening condition. Symptoms of hypoglycemia (sleepiness, dizziness, and confusion) are similar to symptoms of having too much alcohol. If your health care provider says that alcohol is safe for you, follow these guidelines:  Limit alcohol intake to no more than 1 drink per day for nonpregnant women and 2 drinks per day for men. One drink equals 12 oz of beer, 5 oz of wine, or 1 oz of hard liquor.  Do not drink on an empty stomach.  Keep yourself hydrated with water, diet  soda, or unsweetened iced tea.  Keep in mind that regular soda, juice, and other mixers may contain a lot of sugar and must be counted as carbohydrates.  What are tips for following this plan? Reading food labels  Start by checking the serving size on the label. The amount of calories, carbohydrates, fats, and other nutrients listed on the label are based on one serving of the food. Many foods contain more than one serving per package.  Check the total grams (g) of carbohydrates in one serving. You can calculate the number of servings of carbohydrates in one serving by dividing the total carbohydrates by 15. For example, if a food has 30 g of total carbohydrates, it would be equal to 2 servings of  carbohydrates.  Check the number of grams (g) of saturated and trans fats in one serving. Choose foods that have low or no amount of these fats.  Check the number of milligrams (mg) of sodium in one serving. Most people should limit total sodium intake to less than 2,300 mg per day.  Always check the nutrition information of foods labeled as "low-fat" or "nonfat". These foods may be higher in added sugar or refined carbohydrates and should be avoided.  Talk to your dietitian to identify your daily goals for nutrients listed on the label. Shopping  Avoid buying canned, premade, or processed foods. These foods tend to be high in fat, sodium, and added sugar.  Shop around the outside edge of the grocery store. This includes fresh fruits and vegetables, bulk grains, fresh meats, and fresh dairy. Cooking  Use low-heat cooking methods, such as baking, instead of high-heat cooking methods like deep frying.  Cook using healthy oils, such as olive, canola, or sunflower oil.  Avoid cooking with butter, cream, or high-fat meats. Meal planning  Eat meals and snacks regularly, preferably at the same times every day. Avoid going long periods of time without eating.  Eat foods high in fiber, such as fresh fruits, vegetables, beans, and whole grains. Talk to your dietitian about how many servings of carbohydrates you can eat at each meal.  Eat 4-6 ounces of lean protein each day, such as lean meat, chicken, fish, eggs, or tofu. 1 ounce is equal to 1 ounce of meat, chicken, or fish, 1 egg, or 1/4 cup of tofu.  Eat some foods each day that contain healthy fats, such as avocado, nuts, seeds, and fish. Lifestyle   Check your blood glucose regularly.  Exercise at least 30 minutes 5 or more days each week, or as told by your health care provider.  Take medicines as told by your health care provider.  Do not use any products that contain nicotine or tobacco, such as cigarettes and e-cigarettes. If  you need help quitting, ask your health care provider.  Work with a Social worker or diabetes educator to identify strategies to manage stress and any emotional and social challenges. What are some questions to ask my health care provider?  Do I need to meet with a diabetes educator?  Do I need to meet with a dietitian?  What number can I call if I have questions?  When are the best times to check my blood glucose? Where to find more information:  American Diabetes Association: diabetes.org/food-and-fitness/food  Academy of Nutrition and Dietetics: PokerClues.dk  [employer] of Diabetes and Digestive and Kidney Diseases (NIH): ContactWire.be Summary  A healthy meal plan will help you control your blood glucose and maintain a healthy lifestyle.  Working  with a diet and nutrition specialist (dietitian) can help you make a meal plan that is best for you.  Keep in mind that carbohydrates and alcohol have immediate effects on your blood glucose levels. It is important to count carbohydrates and to use alcohol carefully. This information is not intended to replace advice given to you by your health care provider. Make sure you discuss any questions you have with your health care provider. Document Released: 09/12/2005 Document Revised: 01/20/2017 Document Reviewed: 01/20/2017 Elsevier Interactive Patient Education  [employer].

## 2018-09-08 DIAGNOSIS — Z1211 Encounter for screening for malignant neoplasm of colon: Secondary | ICD-10-CM | POA: Insufficient documentation

## 2018-09-08 DIAGNOSIS — Z23 Encounter for immunization: Secondary | ICD-10-CM | POA: Insufficient documentation

## 2018-09-08 LAB — MICROALBUMIN / CREATININE URINE RATIO
Creatinine, Urine: 155.2 mg/dL
Microalb/Creat Ratio: 445.4 mg/g{creat} — ABNORMAL HIGH (ref 0.0–30.0)
Microalbumin, Urine: 691.2 ug/mL

## 2018-09-08 NOTE — Assessment & Plan Note (Signed)
Referral for colonoscopy was provided.

## 2018-09-08 NOTE — Assessment & Plan Note (Signed)
Patient was accompanied by his wife who was quite upset about his chart having a diagnosis of diabetes in their, stating he developed diabetes after taking a short course of prednisone for his gout flare a few years ago, he is off all the medications for more than a year now and being able to control his diabetes.  She was really concerned that he is having difficulty with his life insurance premium because of this diagnosis.  He has multiple family members with diabetes.  On A1c checked today it was 7.4, it was 5.8 7 month ago. He does not follow diabetic or low-salt diet. Does not exercise regularly. Used to take metformin and Lantus before.  -Restarted him on metformin 500 mg twice daily-dose can be uptitrated. -Had a extensive discussion regarding lifestyle modifications, patient seems agreeable to that.

## 2018-09-08 NOTE — Assessment & Plan Note (Signed)
Refilled his prescription for Lipitor.

## 2018-09-08 NOTE — Assessment & Plan Note (Signed)
He was provided with flu shot today.  He is also due for a pneumonia vaccine-which she declined today stating that he does not want to take more than one shot.

## 2018-09-08 NOTE — Assessment & Plan Note (Signed)
BP Readings from Last 3 Encounters:  09/07/18 (!) 144/90  07/22/18 (!) 155/95  05/08/18 135/88   His blood pressure was elevated.  Denies any headache, blurry vision, chest pain or exertional dyspnea.  He did not took his medications this morning.  Do miss his pills while rushing or different errands.  We will continue current dose of losartan at 100 mg daily. Had a long discussion regarding being compliant. Re evaluate during next follow-up visit.

## 2018-09-10 NOTE — Progress Notes (Signed)
Internal Medicine Clinic Attending  Case discussed with Dr. Amin at the time of the visit.  We reviewed the resident's history and exam and pertinent patient test results.  I agree with the assessment, diagnosis, and plan of care documented in the resident's note.  Alexander Raines, M.D., Ph.D.  

## 2018-09-16 ENCOUNTER — Encounter: Payer: Self-pay | Admitting: Internal Medicine

## 2018-10-15 ENCOUNTER — Telehealth: Payer: Self-pay | Admitting: Internal Medicine

## 2018-10-15 NOTE — Addendum Note (Signed)
Addended by: Hulan Fray on: 10/15/2018 04:40 PM   Modules accepted: Orders

## 2019-01-30 ENCOUNTER — Other Ambulatory Visit: Payer: Self-pay | Admitting: Internal Medicine

## 2019-02-12 ENCOUNTER — Other Ambulatory Visit: Payer: Self-pay | Admitting: Internal Medicine

## 2019-02-12 DIAGNOSIS — E1129 Type 2 diabetes mellitus with other diabetic kidney complication: Secondary | ICD-10-CM

## 2019-02-12 DIAGNOSIS — R809 Proteinuria, unspecified: Principal | ICD-10-CM

## 2019-02-13 IMAGING — DX DG PORTABLE PELVIS
1 series · 1 of 1 positions shown · non-contrast
Comparison: 03/05/2018

CLINICAL DATA: Status post right hip replacement

EXAM:
PORTABLE PELVIS 1-2 VIEWS

[pelvis ap]
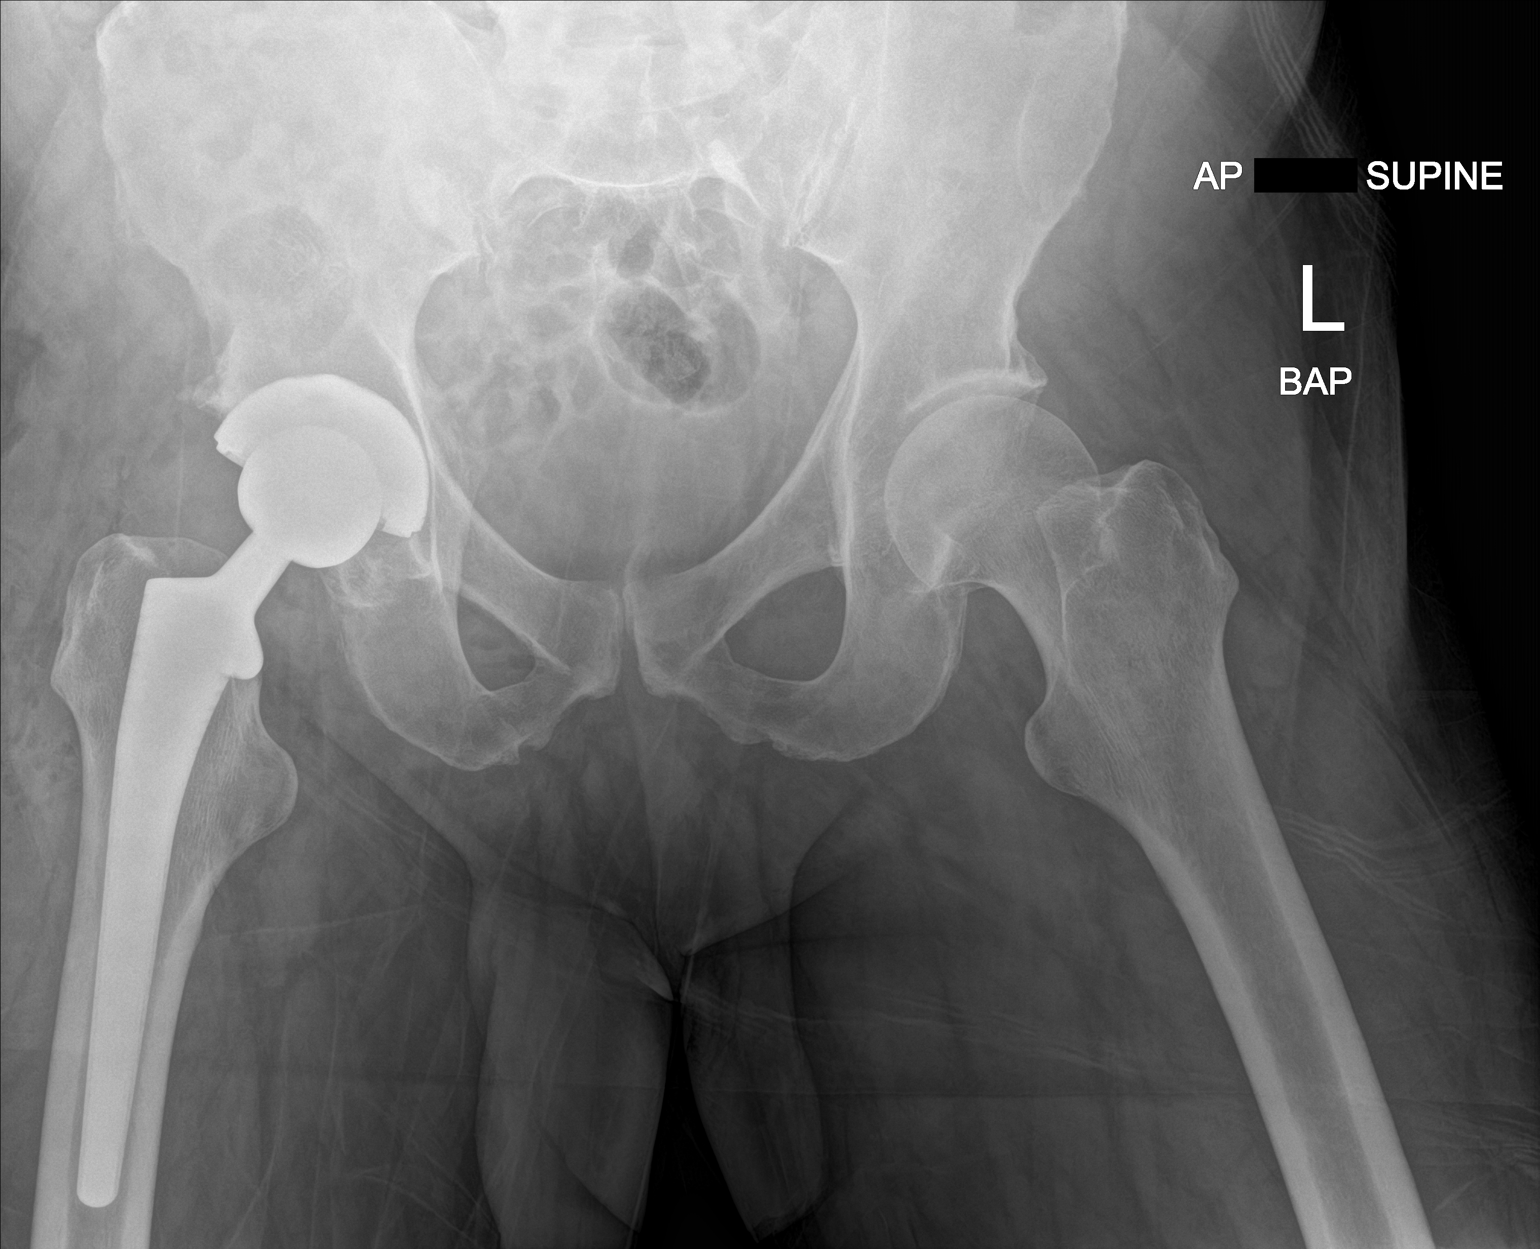

[1 of 1 positions shown; findings below may reference images not displayed]

FINDINGS: Interval right hip replacement is noted. No acute bony or soft
tissue abnormality is seen.
IMPRESSION: Status post right hip replacement.

## 2019-04-12 ENCOUNTER — Ambulatory Visit (INDEPENDENT_AMBULATORY_CARE_PROVIDER_SITE_OTHER): Payer: BLUE CROSS/BLUE SHIELD | Admitting: Internal Medicine

## 2019-04-12 ENCOUNTER — Other Ambulatory Visit: Payer: Self-pay

## 2019-04-12 DIAGNOSIS — M1A49X0 Other secondary chronic gout, multiple sites, without tophus (tophi): Secondary | ICD-10-CM | POA: Diagnosis not present

## 2019-04-12 DIAGNOSIS — R809 Proteinuria, unspecified: Secondary | ICD-10-CM

## 2019-04-12 DIAGNOSIS — E1129 Type 2 diabetes mellitus with other diabetic kidney complication: Secondary | ICD-10-CM

## 2019-04-12 DIAGNOSIS — I10 Essential (primary) hypertension: Secondary | ICD-10-CM | POA: Diagnosis not present

## 2019-04-12 MED ORDER — METFORMIN HCL ER 500 MG PO TB24: ORAL_TABLET | ORAL | 1 refills | Status: DC

## 2019-04-12 MED ORDER — INDOMETHACIN 50 MG PO CAPS: 50.0000 mg | ORAL_CAPSULE | Freq: Three times a day (TID) | ORAL | 2 refills | Status: DC | PRN

## 2019-04-12 MED ORDER — LOSARTAN POTASSIUM 100 MG PO TABS
100.0000 mg | ORAL_TABLET | Freq: Every day | ORAL | 3 refills | Status: DC
Start: 2019-04-12 — End: 2020-05-26

## 2019-04-12 NOTE — Assessment & Plan Note (Signed)
Patient checked his blood sugar once every 2 to 3 weeks and stays at remains with around 150. He is compliant with metformin 1000 mg daily. Denies any diarrhea or stomach upset.  -Asked patient to check his fasting daily for next few days and if it remains above 120 he should increase his metformin 1000 mg twice daily. -We will reevaluate during next follow-up visit.

## 2019-04-12 NOTE — Assessment & Plan Note (Signed)
No gout flareup at this time but he was asking for a refill of his indomethacin which he used only during gouty pain.  Refill was provided.

## 2019-04-12 NOTE — Assessment & Plan Note (Signed)
Occasionally checks his blood pressure at home, per patient it stays between 244-010 systolic most of the time. Denies any headache or dizziness. Denies any chest pain, palpitations or shortness of breath.  -Continue with current dose of losartan at 100 mg daily. -Will reevaluate during next follow-up visit in clinic.

## 2019-04-12 NOTE — Progress Notes (Signed)
   This is a telephone encounter between [facility] and Lorella Nimrod on 04/12/2019 for CC. The visit was conducted with the patient located at home and Lorella Nimrod at [facility]. The patient's identity was confirmed using their DOB and current address. The patient has consented to being evaluated through a telephone encounter and understands the associated risks (an examination cannot be done and the patient may need to come in for an appointment) / benefits (allows the patient to remain at home, decreasing exposure to coronavirus). I personally spent 5 minutes on medical discussion.  CC: Follow-up of diabetes and hypertension.  HPI:  Mr.Cheng Anspach is a 58 y.o. with past medical history as listed below was given a call to follow-up of his diabetes and hypertension.  Patient has no new complaints today.  See assessment and plan for his chronic conditions.  Past Medical History:  Diagnosis Date  . Arthritis   . Chronic lower back pain   . GERD (gastroesophageal reflux disease)   . Gout   . Hypertension   . Seasonal allergies    Review of Systems: Negative except mentioned in HPI.  Physical Exam:  There were no vitals filed for this visit. No physical exam done as it was a telemetry encounter.  Assessment & Plan:   See Encounters Tab for problem based charting.  Patient discussed with Dr. Beryle Beams.

## 2019-04-13 NOTE — Progress Notes (Signed)
Medicine attending: Medical history, presenting problems,  and medications, reviewed with resident physician Dr Lorella Nimrod on the day of the patient telephone consultation and I concur with her evaluation and management plan. HTN, DM-2 on Metformin, gout. Stable. Refill meds.

## 2019-06-03 ENCOUNTER — Other Ambulatory Visit: Payer: Self-pay | Admitting: Internal Medicine

## 2019-06-28 ENCOUNTER — Encounter: Payer: Self-pay | Admitting: *Deleted

## 2019-09-30 ENCOUNTER — Other Ambulatory Visit: Payer: Self-pay | Admitting: *Deleted

## 2019-09-30 DIAGNOSIS — E1129 Type 2 diabetes mellitus with other diabetic kidney complication: Secondary | ICD-10-CM

## 2019-09-30 DIAGNOSIS — I10 Essential (primary) hypertension: Secondary | ICD-10-CM

## 2019-10-05 MED ORDER — ATORVASTATIN CALCIUM 40 MG PO TABS
40.0000 mg | ORAL_TABLET | Freq: Every day | ORAL | 5 refills | Status: DC
Start: 2019-10-05 — End: 2020-09-14

## 2019-10-06 ENCOUNTER — Other Ambulatory Visit: Payer: Self-pay | Admitting: *Deleted

## 2019-10-06 DIAGNOSIS — M109 Gout, unspecified: Secondary | ICD-10-CM

## 2019-10-06 MED ORDER — ALLOPURINOL 100 MG PO TABS: 100.0000 mg | ORAL_TABLET | Freq: Every day | ORAL | 0 refills | Status: DC

## 2019-10-18 ENCOUNTER — Other Ambulatory Visit: Payer: Self-pay

## 2019-10-18 ENCOUNTER — Encounter: Payer: Self-pay | Admitting: Internal Medicine

## 2019-10-18 ENCOUNTER — Ambulatory Visit (INDEPENDENT_AMBULATORY_CARE_PROVIDER_SITE_OTHER): Payer: BC Managed Care – PPO | Admitting: Internal Medicine

## 2019-10-18 VITALS — BP 148/99 | HR 87 | Temp 98.4°F | Wt 224.6 lb

## 2019-10-18 DIAGNOSIS — M1A49X0 Other secondary chronic gout, multiple sites, without tophus (tophi): Secondary | ICD-10-CM

## 2019-10-18 DIAGNOSIS — Z79899 Other long term (current) drug therapy: Secondary | ICD-10-CM

## 2019-10-18 DIAGNOSIS — Z1211 Encounter for screening for malignant neoplasm of colon: Secondary | ICD-10-CM

## 2019-10-18 DIAGNOSIS — E1129 Type 2 diabetes mellitus with other diabetic kidney complication: Secondary | ICD-10-CM | POA: Diagnosis not present

## 2019-10-18 DIAGNOSIS — E785 Hyperlipidemia, unspecified: Secondary | ICD-10-CM

## 2019-10-18 DIAGNOSIS — R809 Proteinuria, unspecified: Secondary | ICD-10-CM

## 2019-10-18 DIAGNOSIS — I10 Essential (primary) hypertension: Secondary | ICD-10-CM

## 2019-10-18 DIAGNOSIS — E1169 Type 2 diabetes mellitus with other specified complication: Secondary | ICD-10-CM

## 2019-10-18 DIAGNOSIS — Z9889 Other specified postprocedural states: Secondary | ICD-10-CM

## 2019-10-18 DIAGNOSIS — F17200 Nicotine dependence, unspecified, uncomplicated: Secondary | ICD-10-CM

## 2019-10-18 DIAGNOSIS — Z7984 Long term (current) use of oral hypoglycemic drugs: Secondary | ICD-10-CM

## 2019-10-18 DIAGNOSIS — M545 Low back pain: Secondary | ICD-10-CM

## 2019-10-18 DIAGNOSIS — Z79891 Long term (current) use of opiate analgesic: Secondary | ICD-10-CM

## 2019-10-18 DIAGNOSIS — Z23 Encounter for immunization: Secondary | ICD-10-CM | POA: Diagnosis not present

## 2019-10-18 DIAGNOSIS — G8929 Other chronic pain: Secondary | ICD-10-CM

## 2019-10-18 DIAGNOSIS — Z720 Tobacco use: Secondary | ICD-10-CM

## 2019-10-18 LAB — GLUCOSE, CAPILLARY: Glucose-Capillary: 122 mg/dL — ABNORMAL HIGH (ref 70–99)

## 2019-10-18 LAB — POCT GLYCOSYLATED HEMOGLOBIN (HGB A1C): Hemoglobin A1C: 6.5 % — AB (ref 4.0–5.6)

## 2019-10-18 MED ORDER — INDOMETHACIN 50 MG PO CAPS: 50.0000 mg | ORAL_CAPSULE | Freq: Three times a day (TID) | ORAL | 2 refills | Status: DC | PRN

## 2019-10-18 NOTE — Assessment & Plan Note (Signed)
Patient's blood pressure elevated today. [id number] systolic initially and [id number] on recheck. Patient says he does not consistently take his blood pressure at home, but does have a blood pressure cuff.  -Start taking losartan 100 mg at night instead of the morning -Take blood pressure in a journal 3 times a week and review at next visit -Follow-up in 1 month

## 2019-10-18 NOTE — Assessment & Plan Note (Signed)
Flu shot given

## 2019-10-18 NOTE — Assessment & Plan Note (Signed)
Referral for colonoscopy was provided.  Patient is overdue.

## 2019-10-18 NOTE — Assessment & Plan Note (Signed)
Patient sees a spine surgeon who prescribes him tramadol.  He takes it occasionally.  Asking for refill on indomethacin. -Refilled indomethacin

## 2019-10-18 NOTE — Addendum Note (Signed)
Addended by: Marcelino Duster on: 10/18/2019 04:23 PM   Modules accepted: Orders

## 2019-10-18 NOTE — Assessment & Plan Note (Signed)
Last lipid panel was from February 2019.  -Obtain lipid panel today -Continue atorvastatin 40 mg daily

## 2019-10-18 NOTE — Assessment & Plan Note (Addendum)
Patient says his blood sugars have ranged from the low 100s to 170s over the past couple weeks.  A1c 6.5 today from 7.4-year ago.  Does not have any neuropathic symptoms. -Continue Metformin 500 mg twice daily -Obtain urine microalbumin creatinine ratio today -Diabetic foot exam at next visit

## 2019-10-18 NOTE — Patient Instructions (Signed)
Thank you for visiting [place] in clinic today. Below is a summary of what we discussed:  1.  Hypertension - Write down your blood pressures in a journal 3 times a week.  We can go over this at your next visit. - Start taking your losartan at night instead of in the morning. - Follow-up in 1 month to recheck your blood pressure  2.  Cholesterol -We got some blood test to take a look at your cholesterol levels.  Continue to take the atorvastatin.  4.  Diabetes -Continue Metformin 500 mg twice daily  5.  Smoking - At your next visit, we can discuss other ways we can help you decrease smoking.  6.  Colonoscopy -You are due for your next colonoscopy.  If the facility does not call you within the next 2 weeks to schedule, please call us.  7.  Follow-up - Follow-up in 1 month  If you have any other questions or concerns, please feel free to reach out to [place].

## 2019-10-18 NOTE — Progress Notes (Signed)
   CC: HTN & T2DM follow up   HPI:  [name] is a 58 y.o. with a history noted below who presents for HTN & T2DM follow up. Since the pt's last visit in April, the patient says he has been doing well since then. Says he has been checking his blood sugars and have ranged in the low 100s-170s over the last couple weeks. A1c 6.5 today. Takes Metformin 500 mg BID. Takes Losartan 100 mg BP. Willing to try take BP meds at night.    He smokes 1 pack per week for the last 45 years. Tried nicotine gum but didn't like it. Drinks 3 to 4 beers.   Health maintenance:  - Diabetic eye exam completed  last month - Foot exam due at next visit. - Colonoscopy in 2013, had tubular adenoma w/ no high grade dysplasia. Needs colonoscopy. - Pneumovax and flu shot today.   Past Medical History:  Diagnosis Date  . Arthritis   . Chronic lower back pain   . GERD (gastroesophageal reflux disease)   . Gout   . Hypertension   . Seasonal allergies    Review of Systems:  All systems have been reviewed and are otherwise negative unless mentioned in the HPI.  Physical Exam:  Vitals:   10/18/19 1338  BP: (!) 148/99  Pulse: 87  Temp: 98.4 F (36.9 C)  TempSrc: Oral  SpO2: 99%  Weight: 224 lb 9.6 oz (101.9 kg)   Physical Exam  Assessment & Plan:   See Encounters Tab for problem based charting.  Patient seen with Dr. Rebeca Alert

## 2019-10-18 NOTE — Assessment & Plan Note (Addendum)
Patient says he goes through 1 pack of cigarettes lasts him the week.  Has been smoking for the last 45 years.  We discussed different cessation options and he would like to continue to think about it.  Willing to discuss smoking cessation options at next visit. -Pneumovax vaccine given today.

## 2019-10-19 LAB — LIPID PANEL
Chol/HDL Ratio: 2.6 ratio (ref 0.0–5.0)
Cholesterol, Total: 167 mg/dL (ref 100–199)
HDL: 64 mg/dL (ref >39)
LDL Chol Calc (NIH): 57 mg/dL (ref 0–99)
Triglycerides: 298 mg/dL — ABNORMAL HIGH (ref 0–149)
VLDL Cholesterol Cal: 46 mg/dL — ABNORMAL HIGH (ref 5–40)

## 2019-10-19 LAB — MICROALBUMIN / CREATININE URINE RATIO
Creatinine, Urine: 148.9 mg/dL
Microalb/Creat Ratio: 457 mg/g creat — ABNORMAL HIGH (ref 0–29)
Microalbumin, Urine: 679.9 ug/mL

## 2019-10-22 ENCOUNTER — Encounter: Payer: Self-pay | Admitting: Internal Medicine

## 2019-11-11 ENCOUNTER — Encounter: Payer: Self-pay | Admitting: Gastroenterology

## 2019-11-19 NOTE — Progress Notes (Signed)
Internal Medicine Clinic Attending  I saw and evaluated the patient.  I personally confirmed the key portions of the history and exam documented by Dr. Alexander and I reviewed pertinent patient test results.  The assessment, diagnosis, and plan were formulated together and I agree with the documentation in the resident's note.  Alexander Raines, M.D., Ph.D.  

## 2019-12-02 ENCOUNTER — Other Ambulatory Visit: Payer: Self-pay

## 2019-12-02 ENCOUNTER — Ambulatory Visit (AMBULATORY_SURGERY_CENTER): Payer: BC Managed Care – PPO | Admitting: *Deleted

## 2019-12-02 VITALS — Temp 96.9°F | Ht 71.0 in | Wt 225.0 lb

## 2019-12-02 DIAGNOSIS — Z1159 Encounter for screening for other viral diseases: Secondary | ICD-10-CM

## 2019-12-02 DIAGNOSIS — Z86010 Personal history of colonic polyps: Secondary | ICD-10-CM

## 2019-12-02 MED ORDER — SUPREP BOWEL PREP KIT 17.5-3.13-1.6 GM/177ML PO SOLN: 1.0000 | Freq: Once | ORAL | 0 refills | Status: AC

## 2019-12-02 NOTE — Progress Notes (Signed)
No egg or soy allergy known to patient  No issues with past sedation with any surgeries  or procedures, no intubation problems  No diet pills per patient No home 02 use per patient  No blood thinners per patient  Pt denies issues with constipation  No A fib or A flutter  EMMI video sent to pt's e mail  Sup $15   Due to the COVID-19 pandemic we are asking patients to follow these guidelines. Please only bring one care partner. Please be aware that your care partner may wait in the car in the parking lot or if they feel like they will be too hot to wait in the car, they may wait in the lobby on the 4th floor. All care partners are required to wear a mask the entire time (we do not have any that we can provide them), they need to practice social distancing, and we will do a Covid check for all patient's and care partners when you arrive. Also we will check their temperature and your temperature. If the care partner waits in their car they need to stay in the parking lot the entire time and we will call them on their cell phone when the patient is ready for discharge so they can bring the car to the front of the building. Also all patient's will need to wear a mask into building.

## 2019-12-03 ENCOUNTER — Encounter: Payer: Self-pay | Admitting: Gastroenterology

## 2019-12-13 ENCOUNTER — Other Ambulatory Visit: Payer: Self-pay | Admitting: Gastroenterology

## 2019-12-13 ENCOUNTER — Ambulatory Visit (INDEPENDENT_AMBULATORY_CARE_PROVIDER_SITE_OTHER): Payer: BC Managed Care – PPO

## 2019-12-13 DIAGNOSIS — Z1159 Encounter for screening for other viral diseases: Secondary | ICD-10-CM

## 2019-12-14 LAB — SARS CORONAVIRUS 2 (TAT 6-24 HRS): SARS Coronavirus 2: NEGATIVE

## 2019-12-16 ENCOUNTER — Ambulatory Visit (AMBULATORY_SURGERY_CENTER): Payer: BC Managed Care – PPO | Admitting: Gastroenterology

## 2019-12-16 ENCOUNTER — Other Ambulatory Visit: Payer: Self-pay

## 2019-12-16 ENCOUNTER — Encounter: Payer: Self-pay | Admitting: Gastroenterology

## 2019-12-16 VITALS — BP 150/93 | HR 62 | Temp 98.2°F | Resp 15 | Ht 71.0 in | Wt 225.0 lb

## 2019-12-16 DIAGNOSIS — D122 Benign neoplasm of ascending colon: Secondary | ICD-10-CM

## 2019-12-16 DIAGNOSIS — K635 Polyp of colon: Secondary | ICD-10-CM

## 2019-12-16 DIAGNOSIS — D123 Benign neoplasm of transverse colon: Secondary | ICD-10-CM | POA: Diagnosis not present

## 2019-12-16 DIAGNOSIS — D125 Benign neoplasm of sigmoid colon: Secondary | ICD-10-CM | POA: Diagnosis not present

## 2019-12-16 DIAGNOSIS — Z86010 Personal history of colonic polyps: Secondary | ICD-10-CM

## 2019-12-16 MED ORDER — SODIUM CHLORIDE 0.9 % IV SOLN: 500.0000 mL | Freq: Once | INTRAVENOUS | Status: DC

## 2019-12-16 NOTE — Op Note (Addendum)
[place] Patient Name: [name] Procedure Date: 12/16/2019 10:15 AM MRN: [id number] Endoscopist: Mauri Pole , MD Age: 58 Referring MD:  Date of Birth: [birth year] Gender: Male Account #: [id number] Procedure:                Colonoscopy Indications:              Screening for colorectal malignant neoplasm Medicines:                Monitored Anesthesia Care Procedure:                Pre-Anesthesia Assessment:                           - Prior to the procedure, a History and Physical                            was performed, and patient medications and                            allergies were reviewed. The patient's tolerance of                            previous anesthesia was also reviewed. The risks                            and benefits of the procedure and the sedation                            options and risks were discussed with the patient.                            All questions were answered, and informed consent                            was obtained. Prior Anticoagulants: The patient has                            taken no previous anticoagulant or antiplatelet                            agents. ASA Grade Assessment: II - A patient with                            mild systemic disease. After reviewing the risks                            and benefits, the patient was deemed in                            satisfactory condition to undergo the procedure.                           After obtaining informed consent, the colonoscope  was passed under direct vision. Throughout the                            procedure, the patient's blood pressure, pulse, and                            oxygen saturations were monitored continuously. The                            Colonoscope was introduced through the anus and                            advanced to the the cecum, identified by                            appendiceal orifice  and ileocecal valve. The                            colonoscopy was performed without difficulty. The                            patient tolerated the procedure well. The quality                            of the bowel preparation was good. The ileocecal                            valve, appendiceal orifice, and rectum were                            photographed. Scope In: 10:19:45 AM Scope Out: 10:39:35 AM Scope Withdrawal Time: 0 hours 17 minutes 18 seconds  Total Procedure Duration: 0 hours 19 minutes 50 seconds  Findings:                 The perianal and digital rectal examinations were                            normal.                           Two sessile polyps were found in the sigmoid colon                            and ascending colon. The polyps were 1 to 2 mm in                            size. These polyps were removed with a cold biopsy                            forceps. Resection and retrieval were complete.                           Four sessile polyps were found in the transverse  colon and ascending colon. The polyps were 4 to 8                            mm in size. These polyps were removed with a cold                            snare. Resection and retrieval were complete.                           A few small-mouthed diverticula were found in the                            sigmoid colon and ascending colon.                           Non-bleeding internal hemorrhoids were found during                            retroflexion. The hemorrhoids were medium-sized.                           The exam was otherwise without abnormality. Complications:            No immediate complications. Estimated Blood Loss:     Estimated blood loss was minimal. Impression:               - Two 1 to 2 mm polyps in the sigmoid colon and in                            the ascending colon, removed with a cold biopsy                            forceps. Resected and  retrieved.                           - Four 4 to 8 mm polyps in the transverse colon and                            in the ascending colon, removed with a cold snare.                            Resected and retrieved.                           - Mild diverticulosis in the sigmoid colon and in                            the ascending colon.                           - Non-bleeding internal hemorrhoids.                           - The examination was otherwise normal. Recommendation:           -  Patient has a contact number available for                            emergencies. The signs and symptoms of potential                            delayed complications were discussed with the                            patient. Return to normal activities tomorrow.                            Written discharge instructions were provided to the                            patient.                           - Resume previous diet.                           - Continue present medications.                           - Await pathology results.                           - Repeat colonoscopy in 3 years for surveillance                            based on pathology results. Mauri Pole, MD 12/16/2019 10:44:33 AM This report has been signed electronically.

## 2019-12-16 NOTE — Progress Notes (Signed)
Pt's states no medical or surgical changes since previsit or office visit. 

## 2019-12-16 NOTE — Patient Instructions (Signed)
YOU HAD AN ENDOSCOPIC PROCEDURE TODAY AT [facility]:   Refer to the procedure report that was given to you for any specific questions about what was found during the examination.  If the procedure report does not answer your questions, please call your gastroenterologist to clarify.  If you requested that your care partner not be given the details of your procedure findings, then the procedure report has been included in a sealed envelope for you to review at your convenience later.  YOU SHOULD EXPECT: Some feelings of bloating in the abdomen. Passage of more gas than usual.  Walking can help get rid of the air that was put into your GI tract during the procedure and reduce the bloating. If you had a lower endoscopy (such as a colonoscopy or flexible sigmoidoscopy) you may notice spotting of blood in your stool or on the toilet paper. If you underwent a bowel prep for your procedure, you may not have a normal bowel movement for a few days.  Please Note:  You might notice some irritation and congestion in your nose or some drainage.  This is from the oxygen used during your procedure.  There is no need for concern and it should clear up in a day or so.  SYMPTOMS TO REPORT IMMEDIATELY:   Following lower endoscopy (colonoscopy or flexible sigmoidoscopy):  Excessive amounts of blood in the stool  Significant tenderness or worsening of abdominal pains  Swelling of the abdomen that is new, acute  Fever of 100F or higher     For urgent or emergent issues, a gastroenterologist can be reached at any hour by calling [phone].   DIET:  We do recommend a small meal at first, but then you may proceed to your regular diet.  Drink plenty of fluids but you should avoid alcoholic beverages for 24 hours.  ACTIVITY:  You should plan to take it easy for the rest of today and you should NOT DRIVE or use heavy machinery until tomorrow (because of the sedation medicines used during the test).     FOLLOW UP: Our staff will call the number listed on your records 48-72 hours following your procedure to check on you and address any questions or concerns that you may have regarding the information given to you following your procedure. If we do not reach you, we will leave a message.  We will attempt to reach you two times.  During this call, we will ask if you have developed any symptoms of COVID 19. If you develop any symptoms (ie: fever, flu-like symptoms, shortness of breath, cough etc.) before then, please call [phone].  If you test positive for Covid 19 in the 2 weeks post procedure, please call and report this information to [place].    If any biopsies were taken you will be contacted by phone or by letter within the next 1-3 weeks.  Please call us at [phone] if you have not heard about the biopsies in 3 weeks.    SIGNATURES/CONFIDENTIALITY: You and/or your care partner have signed paperwork which will be entered into your electronic medical record.  These signatures attest to the fact that that the information above on your After Visit Summary has been reviewed and is understood.  Full responsibility of the confidentiality of this discharge information lies with you and/or your care-partner.   Resume medications. Information given on polyps,diverticulosis and hemorrhoids.

## 2019-12-16 NOTE — Progress Notes (Signed)
Called to room to assist during endoscopic procedure.  Patient ID and intended procedure confirmed with present staff. Received instructions for my participation in the procedure from the performing physician.  

## 2019-12-20 ENCOUNTER — Telehealth: Payer: Self-pay

## 2019-12-20 NOTE — Telephone Encounter (Signed)
LVM

## 2019-12-22 ENCOUNTER — Encounter: Payer: Self-pay | Admitting: Gastroenterology

## 2019-12-29 ENCOUNTER — Telehealth: Payer: Self-pay | Admitting: *Deleted

## 2019-12-29 NOTE — Telephone Encounter (Signed)
ROI received via fax. OV notes and A1c faxed to [facility] at [facility] [phone]. Hubbard Hartshorn, BSN, RN-BC

## 2019-12-29 NOTE — Telephone Encounter (Signed)
[name] with [facility] requesting last OV notes and A1c for surgery clearance. Surgery scheduled 01/03/2020. She will fax ROI. Last OV 10/18/2019 with A1c 6.5 at that time. Will fax to Tony at [phone]. Hubbard Hartshorn, BSN, RN-BC

## 2020-01-04 ENCOUNTER — Other Ambulatory Visit: Payer: Self-pay | Admitting: Internal Medicine

## 2020-01-04 DIAGNOSIS — M109 Gout, unspecified: Secondary | ICD-10-CM

## 2020-01-13 ENCOUNTER — Telehealth: Payer: Self-pay | Admitting: Internal Medicine

## 2020-01-13 DIAGNOSIS — R05 Cough: Secondary | ICD-10-CM

## 2020-01-13 DIAGNOSIS — R059 Cough, unspecified: Secondary | ICD-10-CM

## 2020-01-13 NOTE — Telephone Encounter (Signed)
Pt requesting a nurse to callback   [phone]

## 2020-01-13 NOTE — Telephone Encounter (Signed)
Called pt - pt and wife are on the phone together . Stated was scheduled for a procedure in [place]; pre-tested for Covid which came back positive, Stated he has been coughing x 2 weeks (productive cough), tried 2 bottles of Mucinex. He's self quarantined. Wants to know if something can be ordered for the cough. Thanks

## 2020-01-18 MED ORDER — BENZONATATE 100 MG PO CAPS: 100.0000 mg | ORAL_CAPSULE | Freq: Four times a day (QID) | ORAL | 1 refills | Status: DC | PRN

## 2020-01-18 NOTE — Telephone Encounter (Signed)
Pt called / informed of Tessalon perles rx for his cough.

## 2020-03-31 ENCOUNTER — Other Ambulatory Visit: Payer: Self-pay | Admitting: Internal Medicine

## 2020-03-31 DIAGNOSIS — M109 Gout, unspecified: Secondary | ICD-10-CM

## 2020-05-05 ENCOUNTER — Other Ambulatory Visit: Payer: Self-pay

## 2020-05-05 DIAGNOSIS — E1129 Type 2 diabetes mellitus with other diabetic kidney complication: Secondary | ICD-10-CM

## 2020-05-05 MED ORDER — METFORMIN HCL ER 500 MG PO TB24: ORAL_TABLET | ORAL | 1 refills | Status: DC

## 2020-05-08 ENCOUNTER — Encounter: Payer: Self-pay | Admitting: *Deleted

## 2020-05-25 DIAGNOSIS — M51369 Other intervertebral disc degeneration, lumbar region without mention of lumbar back pain or lower extremity pain: Secondary | ICD-10-CM | POA: Insufficient documentation

## 2020-05-25 DIAGNOSIS — M5136 Other intervertebral disc degeneration, lumbar region: Secondary | ICD-10-CM | POA: Insufficient documentation

## 2020-05-25 DIAGNOSIS — M47816 Spondylosis without myelopathy or radiculopathy, lumbar region: Secondary | ICD-10-CM | POA: Insufficient documentation

## 2020-05-25 DIAGNOSIS — M5416 Radiculopathy, lumbar region: Secondary | ICD-10-CM | POA: Insufficient documentation

## 2020-05-26 ENCOUNTER — Other Ambulatory Visit: Payer: Self-pay

## 2020-05-26 DIAGNOSIS — I10 Essential (primary) hypertension: Secondary | ICD-10-CM

## 2020-05-26 MED ORDER — LOSARTAN POTASSIUM 100 MG PO TABS: 100.0000 mg | ORAL_TABLET | Freq: Every day | ORAL | 3 refills | Status: DC

## 2020-05-26 NOTE — Telephone Encounter (Signed)
losartan (COZAAR) 100 MG tablet   REFILL REQUEST @ [address]

## 2020-05-26 NOTE — Telephone Encounter (Signed)
Patient's wife called asking if losartan can be filled today as patient is completely out. States patient thought pharmacy was going to send a refill request several days ago but did not. Hubbard Hartshorn, BSN, RN-BC

## 2020-06-20 ENCOUNTER — Ambulatory Visit: Payer: BC Managed Care – PPO | Admitting: Internal Medicine

## 2020-06-20 VITALS — BP 150/99 | HR 82 | Temp 98.9°F | Ht 71.0 in | Wt 226.0 lb

## 2020-06-20 DIAGNOSIS — I10 Essential (primary) hypertension: Secondary | ICD-10-CM

## 2020-06-20 DIAGNOSIS — M1A49X0 Other secondary chronic gout, multiple sites, without tophus (tophi): Secondary | ICD-10-CM | POA: Diagnosis not present

## 2020-06-20 DIAGNOSIS — R809 Proteinuria, unspecified: Secondary | ICD-10-CM | POA: Diagnosis not present

## 2020-06-20 DIAGNOSIS — B351 Tinea unguium: Secondary | ICD-10-CM | POA: Diagnosis not present

## 2020-06-20 DIAGNOSIS — E1129 Type 2 diabetes mellitus with other diabetic kidney complication: Secondary | ICD-10-CM

## 2020-06-20 DIAGNOSIS — F17200 Nicotine dependence, unspecified, uncomplicated: Secondary | ICD-10-CM

## 2020-06-20 DIAGNOSIS — Z1211 Encounter for screening for malignant neoplasm of colon: Secondary | ICD-10-CM

## 2020-06-20 LAB — POCT GLYCOSYLATED HEMOGLOBIN (HGB A1C): Hemoglobin A1C: 7 % — AB (ref 4.0–5.6)

## 2020-06-20 LAB — GLUCOSE, CAPILLARY: Glucose-Capillary: 130 mg/dL — ABNORMAL HIGH (ref 70–99)

## 2020-06-20 MED ORDER — INDOMETHACIN 50 MG PO CAPS: 50.0000 mg | ORAL_CAPSULE | Freq: Three times a day (TID) | ORAL | 2 refills | Status: DC | PRN

## 2020-06-20 MED ORDER — TERBINAFINE HCL 250 MG PO TABS: 250.0000 mg | ORAL_TABLET | Freq: Every day | ORAL | 0 refills | Status: AC

## 2020-06-20 MED ORDER — LOSARTAN POTASSIUM-HCTZ 100-25 MG PO TABS: 1.0000 | ORAL_TABLET | Freq: Every day | ORAL | 11 refills | Status: DC

## 2020-06-20 NOTE — Assessment & Plan Note (Addendum)
Patient states he has had good control over his blood pressures and tries to be mindful about what he eats.  Hgb A1c was 7.0 today from 6.5 eight months ago.  The patient currently takes Metformin 500 mg twice daily.  Plan: -Start taking Metformin 1000 mg in the a.m. and 500 mg in the p.m. -I offered placing a referral to a dietitian for further recommendations, but the patient states that he will think about it and discuss it at the next visit. -A1c check in 3 months

## 2020-06-20 NOTE — Assessment & Plan Note (Signed)
Patient has not had any gout flareups, but likes to have this medication as needed when it does occur.  Plan: -Indomethacin refilled

## 2020-06-20 NOTE — Assessment & Plan Note (Signed)
The patient has longstanding hypertension that was previously well controlled on losartan 100 mg daily.  Today, he was hypertensive to 158/88 and 150/99 on repeat.  Patient states he has been taking his medication consistently.  He also has a blood pressure cuff at home.  Plan: -Stop losartan -Start losartan-HCTZ 100-25 mg daily -Patient encouraged to take her blood pressure journal and bring it to the next visit -Follow-up in 4 weeks for blood pressure check

## 2020-06-20 NOTE — Progress Notes (Signed)
   CC: T2DM f/u  HPI: [name] is a 59 y.o. with a hx as noted below who presents for T2DM follow up. Please refer to the probem based charting for further details.  Past Medical History:  Diagnosis Date  . Allergy   . Arthritis   . Chronic lower back pain   . Diabetes mellitus without complication ([place])   . GERD (gastroesophageal reflux disease)   . Gout   . Hyperlipidemia   . Hypertension   . Seasonal allergies    Review of Systems:  Negative unless mentioned in the HPI  Physical Exam: Vitals:   06/20/20 1609 06/20/20 1652  BP: (!) 158/88 (!) 150/99  Pulse: 94 82  Temp: 98.9 F (37.2 C)   TempSrc: Oral   SpO2: 100%   Weight: 226 lb (102.5 kg)   Height: 5\' 11"  (1.803 m)    Physical Exam Vitals reviewed.  Constitutional:      General: He is not in acute distress.    Appearance: He is obese. He is not ill-appearing, toxic-appearing or diaphoretic.  HENT:     Head: Normocephalic and atraumatic.  Cardiovascular:     Rate and Rhythm: Normal rate and regular rhythm.     Pulses: Normal pulses.     Heart sounds: Normal heart sounds. No murmur heard.  No friction rub. No gallop.   Pulmonary:     Effort: Pulmonary effort is normal. No respiratory distress.     Breath sounds: Normal breath sounds. No wheezing or rales.  Musculoskeletal:     Right lower leg: Edema present.     Left lower leg: Edema present.       Feet:  Neurological:     General: No focal deficit present.     Mental Status: He is alert and oriented to person, place, and time.  Psychiatric:        Mood and Affect: Mood normal.    Assessment & Plan:   See Encounters Tab for problem based charting.  Patient discussed with Dr. Rebeca Alert

## 2020-06-20 NOTE — Assessment & Plan Note (Addendum)
Patient has a thickened and yellow right big toe toenail.  Patient states that he is seeing these changes and was not sure why.  I explained to him that is likely a fungal infection and can give him another oral medicine.  Patient also asked for a podiatry referral.  Plan: -Terbinafine 250 mg daily for 6 weeks -We will obtain LFTs today, recheck LFTs in follow a-up in 1 month -Podiatry referral ordered

## 2020-06-20 NOTE — Assessment & Plan Note (Signed)
Patient had colonoscopy performed in December 2020 and had multiple polyps removed.  Recommended to repeat in December 2023.

## 2020-06-20 NOTE — Assessment & Plan Note (Addendum)
The patient states that he quit smoking via the cold [place] method after he was diagnosed with Covid back in January.  Patient received Covid vaccine in March.  Plan: -I congratulated the patient on quitting cigarettes -Continue smoking cessation counseling at next visit

## 2020-06-20 NOTE — Patient Instructions (Addendum)
  Thank you for seeing [place] in clinic today.  Below is a summary of what we discussed:  1.  Type 2 diabetes -Start taking Metformin 1000 mg in the morning and 500 mg at night -We will check your A1c in 3 months  2.  Hypertension -Stop taking losartan -In its place, start taking Hyzaar 100-25 mg daily.  This is a combination pill that should get better blood pressure control.  3.  Blood tests -We will call you with the results of your blood tests.  We are getting this chest for screening purposes.  4.  Toe fungus -You have a fungal infection in your toenail.  I prescribed terbinafine 250 mg daily.  Take this medication for 4 weeks.  5.  Follow-up -Schedule a follow-up appointment to see [place] again in 4 weeks so we can do another blood test and recheck your blood pressures.  It has been a pleasure taking care of you [name]. Take care and God bless!

## 2020-06-21 LAB — BMP8+ANION GAP
Anion Gap: 16 mmol/L (ref 10.0–18.0)
BUN/Creatinine Ratio: 14 (ref 9–20)
BUN: 15 mg/dL (ref 6–24)
CO2: 19 mmol/L — ABNORMAL LOW (ref 20–29)
Calcium: 9.3 mg/dL (ref 8.7–10.2)
Chloride: 105 mmol/L (ref 96–106)
Creatinine, Ser: 1.06 mg/dL (ref 0.76–1.27)
GFR calc Af Amer: 88 mL/min/{1.73_m2} (ref >59)
GFR calc non Af Amer: 76 mL/min/{1.73_m2} (ref >59)
Glucose: 126 mg/dL — ABNORMAL HIGH (ref 65–99)
Potassium: 4.3 mmol/L (ref 3.5–5.2)
Sodium: 140 mmol/L (ref 134–144)

## 2020-06-21 LAB — HEPATITIS C ANTIBODY: Hep C Virus Ab: <0.1 s/co ratio (ref 0.0–0.9)

## 2020-06-22 NOTE — Progress Notes (Signed)
Internal Medicine Clinic Attending  Case discussed with Dr. Nyajah Hyson at the time of the visit.  We reviewed the resident's history and exam and pertinent patient test results.  I agree with the assessment, diagnosis, and plan of care documented in the resident's note.  Amberley Hamler, M.D., Ph.D.  

## 2020-07-06 ENCOUNTER — Encounter: Payer: Self-pay | Admitting: Podiatry

## 2020-07-06 ENCOUNTER — Ambulatory Visit (INDEPENDENT_AMBULATORY_CARE_PROVIDER_SITE_OTHER): Payer: BC Managed Care – PPO | Admitting: Podiatry

## 2020-07-06 ENCOUNTER — Other Ambulatory Visit: Payer: Self-pay

## 2020-07-06 DIAGNOSIS — L603 Nail dystrophy: Secondary | ICD-10-CM

## 2020-07-06 NOTE — Progress Notes (Signed)
Subjective:  Patient ID: [name], male    DOB: 10/04/1961,  MRN: [id number] HPI Chief Complaint  Patient presents with  . Nail Problem    Toenails bilateral - thick, discolored nails x years, 1st right the worst-hurts with shoes, no treatment  . Diabetes    States last a1c was 6.0  . New Patient (Initial Visit)    59 y.o. male presents with the above complaint.   ROS: Denies fever chills nausea vomiting muscle aches pains calf pain back pain chest pain shortness of breath.  Past Medical History:  Diagnosis Date  . Allergy   . Arthritis   . Chronic lower back pain   . Diabetes mellitus without complication ([place])   . GERD (gastroesophageal reflux disease)   . Gout   . Hyperlipidemia   . Hypertension   . Seasonal allergies    Past Surgical History:  Procedure Laterality Date  . ANKLE ARTHROSCOPY W/ OPEN REPAIR Bilateral 2008  . ANKLE SURGERY     for torn ligaments at [facility]   . DENTAL SURGERY    . 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  Albuterol Sulfate (PROAIR RESPICLICK) 027 (90 Base) MCG/ACT AEPB, Inhale 2 puffs into the lungs every 6 (six) hours as needed. (Patient not taking: Reported on 12/02/2019), Disp: 1 each, Rfl: 0 .  allopurinol (ZYLOPRIM) 100 MG tablet, TAKE 1 TABLET BY MOUTH EVERY DAY, Disp: 30 tablet, Rfl: 2 .  atorvastatin (LIPITOR) 40 MG tablet, Take 1 tablet (40 mg total) by mouth daily., Disp: 60 tablet, Rfl: 5 .  Blood Glucose Monitoring Suppl (RELION CONFIRM GLUCOSE MONITOR) W/DEVICE KIT, 1 Units by Does not apply route once., Disp: 1 kit, Rfl: 0 .  glucose blood (FREESTYLE TEST STRIPS) test strip, Use as instructed, Disp: 100 each, Rfl: 12 .  indomethacin (INDOCIN) 50 MG capsule, Take 1 capsule (50 mg total) by mouth 3 (three) times daily as needed., Disp: 30  capsule, Rfl: 2 .  losartan-hydrochlorothiazide (HYZAAR) 100-25 MG tablet, Take 1 tablet by mouth daily., Disp: 30 tablet, Rfl: 11 .  metFORMIN (GLUCOPHAGE-XR) 500 MG 24 hr tablet, TAKE 2 TABLETS BY MOUTH EVERY DAY WITH BREAKFAST, Disp: 180 tablet, Rfl: 1 .  terbinafine (LAMISIL) 250 MG tablet, Take 1 tablet (250 mg total) by mouth daily., Disp: 42 tablet, Rfl: 0  Current Facility-Administered Medications:  .  0.9 %  sodium chloride infusion, 500 mL, Intravenous, Once, Nandigam, Venia Minks, MD  Allergies  Allergen Reactions  . Tramadol Itching    Increases blood pressure   Review of Systems Objective:  There were no vitals filed for this visit.  General: Well developed, nourished, in no acute distress, alert and oriented x3   Dermatological: Skin is warm, dry and supple bilateral. Nails x 10 are well maintained with the exception of the first toe right foot.  It does demonstrate a thick yellow dystrophic round nail with subungual debris.  The remainder of the nails are thickened discolored as well.  Remaining integument appears unremarkable at this time. There are no open sores, no preulcerative lesions, no rash or signs of infection present.  Vascular: Dorsalis Pedis artery and Posterior Tibial artery pedal pulses are 2/4 bilateral with immedate capillary fill time. Pedal hair growth present. No varicosities and no lower extremity edema present bilateral.   Neruologic:  Grossly intact via light touch bilateral. Vibratory intact via tuning fork bilateral. Protective threshold with Semmes Wienstein monofilament intact to all pedal sites bilateral. Patellar and Achilles deep tendon reflexes 2+ bilateral. No Babinski or clonus noted bilateral.   Musculoskeletal: No gross boney pedal deformities bilateral. No pain, crepitus, or limitation noted with foot and ankle range of motion bilateral. Muscular strength 5/5 in all groups tested bilateral.  Gait: Unassisted, Nonantalgic.     Radiographs:  None taken  Assessment & Plan:   Assessment: Onychomycosis most likely hallux right.  Currently being treated with terbinafine.  He is only being treated for 30 days.  Pain in limb secondary to onychomycosis and early diabetic neuropathy  Plan: Took samples of the hallux nail right today.  I debrided the remainder of the nails for him today.  I will follow-up with him in 1 month to discuss whether or not we should continue the terbinafine or change it entirely.     Kage Willmann T. [place], [place]

## 2020-07-27 ENCOUNTER — Telehealth: Payer: Self-pay | Admitting: *Deleted

## 2020-07-27 NOTE — Telephone Encounter (Signed)
I informed pt of DR. Hyatt's review of results and he would discuss treatment at the 08/10/2020 appt.

## 2020-07-27 NOTE — Telephone Encounter (Signed)
-----   Message from Garrel Ridgel, [place] sent at 07/25/2020  7:29 AM EDT ----- Positive for fungus and yeast

## 2020-08-07 NOTE — Addendum Note (Signed)
Addended by: Riesa Pope on: 08/07/2020 05:52 PM   Modules accepted: Orders

## 2020-08-10 ENCOUNTER — Ambulatory Visit: Payer: BC Managed Care – PPO | Admitting: Podiatry

## 2020-09-08 ENCOUNTER — Encounter: Payer: Self-pay | Admitting: Student

## 2020-09-08 ENCOUNTER — Other Ambulatory Visit: Payer: Self-pay

## 2020-09-08 ENCOUNTER — Ambulatory Visit (INDEPENDENT_AMBULATORY_CARE_PROVIDER_SITE_OTHER): Payer: BC Managed Care – PPO | Admitting: Student

## 2020-09-08 VITALS — BP 141/88 | HR 103 | Temp 98.2°F | Ht 71.0 in | Wt 222.4 lb

## 2020-09-08 DIAGNOSIS — Z23 Encounter for immunization: Secondary | ICD-10-CM

## 2020-09-08 DIAGNOSIS — B351 Tinea unguium: Secondary | ICD-10-CM

## 2020-09-08 DIAGNOSIS — E785 Hyperlipidemia, unspecified: Secondary | ICD-10-CM

## 2020-09-08 DIAGNOSIS — I10 Essential (primary) hypertension: Secondary | ICD-10-CM

## 2020-09-08 DIAGNOSIS — M109 Gout, unspecified: Secondary | ICD-10-CM | POA: Diagnosis not present

## 2020-09-08 DIAGNOSIS — E1129 Type 2 diabetes mellitus with other diabetic kidney complication: Secondary | ICD-10-CM

## 2020-09-08 DIAGNOSIS — R809 Proteinuria, unspecified: Secondary | ICD-10-CM

## 2020-09-08 DIAGNOSIS — E1169 Type 2 diabetes mellitus with other specified complication: Secondary | ICD-10-CM | POA: Diagnosis not present

## 2020-09-08 DIAGNOSIS — M545 Low back pain, unspecified: Secondary | ICD-10-CM

## 2020-09-08 LAB — POCT GLYCOSYLATED HEMOGLOBIN (HGB A1C): Hemoglobin A1C: 7.7 % — AB (ref 4.0–5.6)

## 2020-09-08 LAB — GLUCOSE, CAPILLARY: Glucose-Capillary: 195 mg/dL — ABNORMAL HIGH (ref 70–99)

## 2020-09-08 MED ORDER — DAPAGLIFLOZIN PROPANEDIOL 5 MG PO TABS: 5.0000 mg | ORAL_TABLET | Freq: Every day | ORAL | 2 refills | Status: AC

## 2020-09-08 MED ORDER — ALLOPURINOL 100 MG PO TABS: 100.0000 mg | ORAL_TABLET | Freq: Every day | ORAL | 5 refills | Status: DC

## 2020-09-08 NOTE — Patient Instructions (Addendum)
[name],  It was a pleasure getting to know you today.  Here is a summary of what we talked about today:  1.  Hypertension: The blood pressure today is 125/80.  Continue taking your blood pressure medication as instructed.  Please try to measure your blood pressure at home and keep a log.  2.  Diabetes: Your A1c is 7.7 today.  I will add another medication called Farxiga.  Regarding your Metformin, please take 2 pills in the morning and in the afternoon.  Please continue to work on healthier diet options.  Also check your blood sugar.  Please follow-up with an eye doctor for annual eye exam.  3.  Protein in your urine: This is a consequence of diabetes.  I will call you for the results.  We may need to add one more medication if you still have high level of protein in the urine.  We will discuss this on your next visit in 3 months.  4.  High cholesterol: Please come back next week for a fasting lab.  We will readjust the medication accordingly with the lab results.  Continue taking atorvastatin 40 mg daily.  5.  Chronic back pain: You can try heating pad and indomethacin for pain.  Exercise as tolerated.  Please contact us if you have any questions or concerns.  Take care,  Dr. Alfonse Spruce

## 2020-09-08 NOTE — Assessment & Plan Note (Signed)
Continue to follow-up with podiatry.  Will check LFTs because patient was on terbinafine for 30 days.

## 2020-09-08 NOTE — Assessment & Plan Note (Signed)
Flu shot today 

## 2020-09-08 NOTE — Progress Notes (Signed)
° °  CC: Routine visit  HPI:  [name] is a 59 y.o. with PMH of HTn, DM, HLD and gout who present to the clinic for routine visit.    Please see problem based charting for further details.   Past Medical History:  Diagnosis Date   Allergy    Arthritis    Chronic lower back pain    Diabetes mellitus without complication (HCC)    GERD (gastroesophageal reflux disease)    Gout    Hyperlipidemia    Hypertension    Seasonal allergies    Review of Systems:  As per HPI  Physical Exam:  Vitals:   09/08/20 1350  BP: (!) 141/88  Pulse: (!) 103  Temp: 98.2 F (36.8 C)  TempSrc: Oral  SpO2: 97%  Weight: 222 lb 6.4 oz (100.9 kg)  Height: 5\' 11"  (1.803 m)   Physical Exam Constitutional:      General: He is not in acute distress. HENT:     Head: Normocephalic.  Eyes:     General: No scleral icterus.       Right eye: No discharge.        Left eye: No discharge.     Conjunctiva/sclera: Conjunctivae normal.  Cardiovascular:     Rate and Rhythm: Normal rate and regular rhythm.     Heart sounds: No murmur heard.   Pulmonary:     Effort: No respiratory distress.     Breath sounds: Normal breath sounds.  Abdominal:     General: Bowel sounds are normal.  Musculoskeletal:     Cervical back: Normal range of motion.     Right lower leg: No edema.     Left lower leg: No edema.     Comments: +2 pulses bilateral lower extremities No open wounds or ulcers noted Right toe: Healing onychomycosis  Skin:    General: Skin is warm.     Coloration: Skin is not jaundiced.  Neurological:     Mental Status: He is alert.  Psychiatric:        Mood and Affect: Mood normal.   ]   Assessment & Plan:   See Encounters Tab for problem based charting.  Patient seen with Dr. Daryll Drown

## 2020-09-08 NOTE — Assessment & Plan Note (Signed)
Will repeat fasting blood panel.  Plan: -Continue atorvastatin 40 mg daily -Adjust medication according to lipid panel results

## 2020-09-08 NOTE — Assessment & Plan Note (Addendum)
A1c elevated to 7.7 today.  Last A1c in June was 7.  Patient states that he is trying to eat more healthy.  His wife cooks at home and she states that they have been trying to eat more vegetable and fruits and reduce salt and sugar.  Will increase Metformin to 1 g 2 times a day.  Will add Farxiga 5 mg daily.  Recheck A1c in 3 months.  Encouraged patient to continue healthier diet and exercise.  Patient continued to have proteinuria which has not been improved despite being on losartan.  Recheck microalbuminuria today.  If continue to have proteinuria, consider adding a nondihydropyridine CCB on next visit.  Plan: -Increase Metformin to 1 g twice daily -Add Farxiga 5 mg daily -Recheck A1c in 3 months -Encourage patient to continue eating healthy and exercise -Continue checking blood sugar at home -Follow-up eye exam annually -Encourage foot exam at home

## 2020-09-08 NOTE — Assessment & Plan Note (Signed)
Patient denies recent gout flare. Continue Allopurinol.  Plan: -refill Allopurinol

## 2020-09-08 NOTE — Assessment & Plan Note (Signed)
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 Lumbar rotation exacerbates the pain. Advised patient to try conservative management such as heat pad, stretching exercise and NSADs.  Plan:  -Conservative management such as heat pad, stretching exercise and NSAIDs

## 2020-09-08 NOTE — Assessment & Plan Note (Addendum)
Initial blood pressure was 141/88.  Recheck blood pressure 125/80.  Will continue losartan-HCTZ.  Advised patient to monitor blood pressure at home and keep a log.  Plan: -Continue losartan-HCTZ 100-25 mg daily -Repeat CMP -Encourage blood pressure monitor at home

## 2020-09-09 LAB — MICROALBUMIN / CREATININE URINE RATIO
Creatinine, Urine: 252.1 mg/dL
Microalb/Creat Ratio: 547 mg/g creat — ABNORMAL HIGH (ref 0–29)
Microalbumin, Urine: 1378.7 ug/mL

## 2020-09-10 NOTE — Progress Notes (Signed)
Internal Medicine Clinic Attending  I saw and evaluated the patient.  I personally confirmed the key portions of the history and exam documented by Dr. Nguyen and I reviewed pertinent patient test results.  The assessment, diagnosis, and plan were formulated together and I agree with the documentation in the resident's note.\  

## 2020-09-14 ENCOUNTER — Other Ambulatory Visit: Payer: Self-pay

## 2020-09-14 ENCOUNTER — Other Ambulatory Visit: Payer: Self-pay | Admitting: Student

## 2020-09-14 ENCOUNTER — Other Ambulatory Visit (INDEPENDENT_AMBULATORY_CARE_PROVIDER_SITE_OTHER): Payer: BC Managed Care – PPO

## 2020-09-14 DIAGNOSIS — R809 Proteinuria, unspecified: Secondary | ICD-10-CM

## 2020-09-14 DIAGNOSIS — I10 Essential (primary) hypertension: Secondary | ICD-10-CM

## 2020-09-14 DIAGNOSIS — E785 Hyperlipidemia, unspecified: Secondary | ICD-10-CM | POA: Diagnosis not present

## 2020-09-14 DIAGNOSIS — E1169 Type 2 diabetes mellitus with other specified complication: Secondary | ICD-10-CM

## 2020-09-14 LAB — COMPREHENSIVE METABOLIC PANEL
ALT: 45 U/L — ABNORMAL HIGH (ref 0–44)
AST: 37 U/L (ref 15–41)
Albumin: 3.6 g/dL (ref 3.5–5.0)
Alkaline Phosphatase: 105 U/L (ref 38–126)
Anion gap: 11 (ref 5–15)
BUN: 35 mg/dL — ABNORMAL HIGH (ref 6–20)
CO2: 20 mmol/L — ABNORMAL LOW (ref 22–32)
Calcium: 10 mg/dL (ref 8.9–10.3)
Chloride: 108 mmol/L (ref 98–111)
Creatinine, Ser: 1.58 mg/dL — ABNORMAL HIGH (ref 0.61–1.24)
GFR calc Af Amer: 54 mL/min — ABNORMAL LOW (ref >60)
GFR calc non Af Amer: 47 mL/min — ABNORMAL LOW (ref >60)
Glucose, Bld: 164 mg/dL — ABNORMAL HIGH (ref 70–99)
Potassium: 4.6 mmol/L (ref 3.5–5.1)
Sodium: 139 mmol/L (ref 135–145)
Total Bilirubin: 0.7 mg/dL (ref 0.3–1.2)
Total Protein: 7.2 g/dL (ref 6.5–8.1)

## 2020-09-14 LAB — LIPID PANEL
Cholesterol: 197 mg/dL (ref 0–200)
HDL: 47 mg/dL (ref >40)
LDL Cholesterol: UNDETERMINED mg/dL (ref 0–99)
Total CHOL/HDL Ratio: 4.2 RATIO
Triglycerides: 495 mg/dL — ABNORMAL HIGH (ref <150)
VLDL: UNDETERMINED mg/dL (ref 0–40)

## 2020-09-14 LAB — LDL CHOLESTEROL, DIRECT: Direct LDL: 81 mg/dL (ref 0–99)

## 2020-09-14 MED ORDER — ATORVASTATIN CALCIUM 40 MG PO TABS: 40.0000 mg | ORAL_TABLET | Freq: Every day | ORAL | 5 refills | Status: DC

## 2020-09-14 NOTE — Addendum Note (Signed)
Addended by: Truddie Crumble on: 09/14/2020 08:39 AM   Modules accepted: Orders

## 2020-10-05 ENCOUNTER — Other Ambulatory Visit: Payer: Self-pay

## 2020-10-05 ENCOUNTER — Ambulatory Visit (INDEPENDENT_AMBULATORY_CARE_PROVIDER_SITE_OTHER): Payer: BC Managed Care – PPO | Admitting: Internal Medicine

## 2020-10-05 DIAGNOSIS — I10 Essential (primary) hypertension: Secondary | ICD-10-CM

## 2020-10-06 LAB — BMP8+ANION GAP
Anion Gap: 17 mmol/L (ref 10.0–18.0)
BUN/Creatinine Ratio: 23 (ref 10–24)
BUN: 22 mg/dL (ref 8–27)
CO2: 19 mmol/L — ABNORMAL LOW (ref 20–29)
Calcium: 10.1 mg/dL (ref 8.6–10.2)
Chloride: 101 mmol/L (ref 96–106)
Creatinine, Ser: 0.97 mg/dL (ref 0.76–1.27)
GFR calc Af Amer: 98 mL/min/{1.73_m2} (ref >59)
GFR calc non Af Amer: 84 mL/min/{1.73_m2} (ref >59)
Glucose: 129 mg/dL — ABNORMAL HIGH (ref 65–99)
Potassium: 4.5 mmol/L (ref 3.5–5.2)
Sodium: 137 mmol/L (ref 134–144)

## 2021-01-08 ENCOUNTER — Telehealth: Payer: Self-pay | Admitting: *Deleted

## 2021-01-08 DIAGNOSIS — E1129 Type 2 diabetes mellitus with other diabetic kidney complication: Secondary | ICD-10-CM

## 2021-01-08 NOTE — Telephone Encounter (Signed)
Patient called in requesting appt for Hyperglycemia. States AM fasting CBG 140-150. Currently 447 but ate 1 hour ago. Encouraged patient to check 2 hours after eating. Also c/o cough, productive of clear soputum x 3 weeks. "Feels cold" intermittently. Had Covid in Jan 2021 and Covid booster in Nov 2021. He will call [phone] today to schedule Covid test. Appt given tomorrow with Yellow Team for hyperglycemia. He was instructed to bring meter and meds to appt. LD

## 2021-01-09 ENCOUNTER — Other Ambulatory Visit: Payer: BC Managed Care – PPO

## 2021-01-09 ENCOUNTER — Encounter: Payer: Self-pay | Admitting: Internal Medicine

## 2021-01-09 ENCOUNTER — Inpatient Hospital Stay (HOSPITAL_COMMUNITY)
Admission: AD | Admit: 2021-01-09 | Discharge: 2021-01-11 | DRG: 637 | Disposition: A | Discharge status: Home / Self Care | Payer: BC Managed Care – PPO | Source: Ambulatory Visit | Attending: Student in an Organized Health Care Education/Training Program | Admitting: Student in an Organized Health Care Education/Training Program

## 2021-01-09 ENCOUNTER — Other Ambulatory Visit: Payer: Self-pay

## 2021-01-09 ENCOUNTER — Ambulatory Visit (HOSPITAL_COMMUNITY)
Admission: RE | Admit: 2021-01-09 | Discharge: 2021-01-09 | Disposition: A | Discharge status: Home / Self Care | Payer: BC Managed Care – PPO | Source: Ambulatory Visit | Attending: Internal Medicine | Admitting: Internal Medicine

## 2021-01-09 ENCOUNTER — Ambulatory Visit: Payer: BC Managed Care – PPO | Admitting: Internal Medicine

## 2021-01-09 VITALS — BP 102/82 | HR 105 | Temp 97.6°F | Wt 206.6 lb

## 2021-01-09 DIAGNOSIS — N179 Acute kidney failure, unspecified: Secondary | ICD-10-CM | POA: Diagnosis not present

## 2021-01-09 DIAGNOSIS — U071 COVID-19: Secondary | ICD-10-CM | POA: Diagnosis present

## 2021-01-09 DIAGNOSIS — R0602 Shortness of breath: Secondary | ICD-10-CM

## 2021-01-09 DIAGNOSIS — E1165 Type 2 diabetes mellitus with hyperglycemia: Secondary | ICD-10-CM | POA: Diagnosis not present

## 2021-01-09 DIAGNOSIS — R079 Chest pain, unspecified: Secondary | ICD-10-CM | POA: Insufficient documentation

## 2021-01-09 DIAGNOSIS — E1129 Type 2 diabetes mellitus with other diabetic kidney complication: Secondary | ICD-10-CM | POA: Diagnosis not present

## 2021-01-09 DIAGNOSIS — R739 Hyperglycemia, unspecified: Secondary | ICD-10-CM

## 2021-01-09 DIAGNOSIS — R809 Proteinuria, unspecified: Secondary | ICD-10-CM

## 2021-01-09 DIAGNOSIS — E875 Hyperkalemia: Secondary | ICD-10-CM | POA: Diagnosis present

## 2021-01-09 DIAGNOSIS — Z833 Family history of diabetes mellitus: Secondary | ICD-10-CM

## 2021-01-09 DIAGNOSIS — E1100 Type 2 diabetes mellitus with hyperosmolarity without nonketotic hyperglycemic-hyperosmolar coma (NKHHC): Secondary | ICD-10-CM

## 2021-01-09 DIAGNOSIS — Z79899 Other long term (current) drug therapy: Secondary | ICD-10-CM

## 2021-01-09 DIAGNOSIS — E860 Dehydration: Secondary | ICD-10-CM

## 2021-01-09 DIAGNOSIS — M1A9XX0 Chronic gout, unspecified, without tophus (tophi): Secondary | ICD-10-CM | POA: Diagnosis present

## 2021-01-09 DIAGNOSIS — J22 Unspecified acute lower respiratory infection: Secondary | ICD-10-CM

## 2021-01-09 DIAGNOSIS — T383X6A Underdosing of insulin and oral hypoglycemic [antidiabetic] drugs, initial encounter: Secondary | ICD-10-CM | POA: Diagnosis present

## 2021-01-09 DIAGNOSIS — I10 Essential (primary) hypertension: Secondary | ICD-10-CM | POA: Diagnosis present

## 2021-01-09 DIAGNOSIS — Z7984 Long term (current) use of oral hypoglycemic drugs: Secondary | ICD-10-CM

## 2021-01-09 DIAGNOSIS — R0981 Nasal congestion: Secondary | ICD-10-CM

## 2021-01-09 DIAGNOSIS — Z7901 Long term (current) use of anticoagulants: Secondary | ICD-10-CM

## 2021-01-09 DIAGNOSIS — E785 Hyperlipidemia, unspecified: Secondary | ICD-10-CM | POA: Diagnosis present

## 2021-01-09 DIAGNOSIS — F1010 Alcohol abuse, uncomplicated: Secondary | ICD-10-CM | POA: Diagnosis present

## 2021-01-09 DIAGNOSIS — F17210 Nicotine dependence, cigarettes, uncomplicated: Secondary | ICD-10-CM | POA: Diagnosis present

## 2021-01-09 DIAGNOSIS — Z20822 Contact with and (suspected) exposure to covid-19: Secondary | ICD-10-CM

## 2021-01-09 DIAGNOSIS — Z91138 Patient's unintentional underdosing of medication regimen for other reason: Secondary | ICD-10-CM

## 2021-01-09 DIAGNOSIS — E876 Hypokalemia: Secondary | ICD-10-CM | POA: Diagnosis present

## 2021-01-09 LAB — URINALYSIS, ROUTINE W REFLEX MICROSCOPIC
Bacteria, UA: NONE SEEN
Bilirubin Urine: NEGATIVE
Glucose, UA: >=500 mg/dL — AB
Ketones, ur: NEGATIVE mg/dL
Leukocytes,Ua: NEGATIVE
Nitrite: NEGATIVE
Protein, ur: 100 mg/dL — AB
Specific Gravity, Urine: 1.024 (ref 1.005–1.030)
pH: 5 (ref 5.0–8.0)

## 2021-01-09 LAB — BASIC METABOLIC PANEL
Anion gap: 14 (ref 5–15)
Anion gap: 13 (ref 5–15)
BUN: 45 mg/dL — ABNORMAL HIGH (ref 6–20)
BUN: 38 mg/dL — ABNORMAL HIGH (ref 6–20)
CO2: 20 mmol/L — ABNORMAL LOW (ref 22–32)
CO2: 23 mmol/L (ref 22–32)
Calcium: 9.7 mg/dL (ref 8.9–10.3)
Calcium: 9.7 mg/dL (ref 8.9–10.3)
Chloride: 85 mmol/L — ABNORMAL LOW (ref 98–111)
Chloride: 91 mmol/L — ABNORMAL LOW (ref 98–111)
Creatinine, Ser: 1.97 mg/dL — ABNORMAL HIGH (ref 0.61–1.24)
Creatinine, Ser: 1.74 mg/dL — ABNORMAL HIGH (ref 0.61–1.24)
GFR, Estimated: 38 mL/min — ABNORMAL LOW (ref >60)
GFR, Estimated: 44 mL/min — ABNORMAL LOW (ref >60)
Glucose, Bld: 596 mg/dL (ref 70–99)
Glucose, Bld: 404 mg/dL — ABNORMAL HIGH (ref 70–99)
Potassium: 5.3 mmol/L — ABNORMAL HIGH (ref 3.5–5.1)
Potassium: 5 mmol/L (ref 3.5–5.1)
Sodium: 119 mmol/L — CL (ref 135–145)
Sodium: 127 mmol/L — ABNORMAL LOW (ref 135–145)

## 2021-01-09 LAB — GLUCOSE, CAPILLARY
Glucose-Capillary: 569 mg/dL (ref 70–99)
Glucose-Capillary: 467 mg/dL — ABNORMAL HIGH (ref 70–99)
Glucose-Capillary: 436 mg/dL — ABNORMAL HIGH (ref 70–99)

## 2021-01-09 LAB — CBC WITH DIFFERENTIAL/PLATELET
Abs Immature Granulocytes: 0.01 10*3/uL (ref 0.00–0.07)
Basophils Absolute: 0 10*3/uL (ref 0.0–0.1)
Basophils Relative: 1 %
Eosinophils Absolute: 0 10*3/uL (ref 0.0–0.5)
Eosinophils Relative: 0 %
HCT: 42.6 % (ref 39.0–52.0)
Hemoglobin: 15.8 g/dL (ref 13.0–17.0)
Immature Granulocytes: 0 %
Lymphocytes Relative: 51 %
Lymphs Abs: 3.2 10*3/uL (ref 0.7–4.0)
MCH: 33.3 pg (ref 26.0–34.0)
MCHC: 37.1 g/dL — ABNORMAL HIGH (ref 30.0–36.0)
MCV: 89.9 fL (ref 80.0–100.0)
Monocytes Absolute: 0.3 10*3/uL (ref 0.1–1.0)
Monocytes Relative: 6 %
Neutro Abs: 2.6 10*3/uL (ref 1.7–7.7)
Neutrophils Relative %: 42 %
Platelets: 282 10*3/uL (ref 150–400)
RBC: 4.74 MIL/uL (ref 4.22–5.81)
RDW: 11.9 % (ref 11.5–15.5)
WBC: 6.2 10*3/uL (ref 4.0–10.5)
nRBC: 0 % (ref 0.0–0.2)

## 2021-01-09 LAB — POCT GLYCOSYLATED HEMOGLOBIN (HGB A1C): Hemoglobin A1C: 12.2 % — AB (ref 4.0–5.6)

## 2021-01-09 LAB — TROPONIN I (HIGH SENSITIVITY): Troponin I (High Sensitivity): 13 ng/L (ref <18)

## 2021-01-09 LAB — RESP PANEL BY RT-PCR (RSV, FLU A&B, COVID)  RVPGX2
Influenza A by PCR: NEGATIVE
Influenza B by PCR: NEGATIVE
Resp Syncytial Virus by PCR: NEGATIVE
SARS Coronavirus 2 by RT PCR: POSITIVE — AB

## 2021-01-09 MED ORDER — INSULIN ASPART 100 UNIT/ML ~~LOC~~ SOLN
8.0000 [IU] | Freq: Once | SUBCUTANEOUS | Status: AC
  Administered 2021-01-09: 8 [IU] via SUBCUTANEOUS

## 2021-01-09 MED ORDER — ONDANSETRON HCL 4 MG PO TABS: 4.0000 mg | ORAL_TABLET | Freq: Four times a day (QID) | ORAL | Status: DC | PRN

## 2021-01-09 MED ORDER — SODIUM CHLORIDE 0.9% FLUSH
3.0000 mL | Freq: Two times a day (BID) | INTRAVENOUS | Status: DC
  Administered 2021-01-10 – 2021-01-11 (×2): 3 mL via INTRAVENOUS

## 2021-01-09 MED ORDER — SODIUM CHLORIDE 0.9 % IV SOLN: 4.0000 mg | Freq: Four times a day (QID) | INTRAVENOUS | Status: DC | PRN

## 2021-01-09 MED ORDER — INSULIN ASPART 100 UNIT/ML ~~LOC~~ SOLN: 0.0000 [IU] | Freq: Three times a day (TID) | SUBCUTANEOUS | Status: DC

## 2021-01-09 MED ORDER — ACETAMINOPHEN 325 MG PO TABS
650.0000 mg | ORAL_TABLET | Freq: Four times a day (QID) | ORAL | Status: DC | PRN
  Administered 2021-01-10: 650 mg via ORAL
  Filled 2021-01-09: qty 2

## 2021-01-09 MED ORDER — ENOXAPARIN SODIUM 150 MG/ML ~~LOC~~ SOLN: 40.0000 mg | SUBCUTANEOUS | Status: DC

## 2021-01-09 MED ORDER — INSULIN ASPART 100 UNIT/ML ~~LOC~~ SOLN
0.0000 [IU] | SUBCUTANEOUS | Status: DC
  Administered 2021-01-09: 15 [IU] via SUBCUTANEOUS
  Administered 2021-01-10: 5 [IU] via SUBCUTANEOUS
  Administered 2021-01-10: 8 [IU] via SUBCUTANEOUS
  Administered 2021-01-10: 3 [IU] via SUBCUTANEOUS
  Administered 2021-01-10: 8 [IU] via SUBCUTANEOUS

## 2021-01-09 MED ORDER — ACETAMINOPHEN 650 MG RE SUPP: 650.0000 mg | Freq: Four times a day (QID) | RECTAL | Status: DC | PRN

## 2021-01-09 MED ORDER — LACTATED RINGERS IV SOLN: INTRAVENOUS | Status: AC

## 2021-01-09 MED ORDER — GUAIFENESIN 100 MG/5ML PO SOLN
5.0000 mL | ORAL | Status: DC | PRN
  Administered 2021-01-09 – 2021-01-11 (×2): 100 mg via ORAL
  Filled 2021-01-09 (×3): qty 5

## 2021-01-09 MED ORDER — ENOXAPARIN SODIUM 40 MG/0.4ML ~~LOC~~ SOLN
40.0000 mg | SUBCUTANEOUS | Status: DC
  Administered 2021-01-09 – 2021-01-10 (×2): 40 mg via SUBCUTANEOUS
  Filled 2021-01-09 (×2): qty 0.4

## 2021-01-09 MED ORDER — SODIUM CHLORIDE 0.9 % IV BOLUS
1000.0000 mL | Freq: Once | INTRAVENOUS | Status: AC
  Administered 2021-01-09: 1000 mL via INTRAVENOUS

## 2021-01-09 MED ORDER — LACTATED RINGERS IV BOLUS: 1000.0000 mL | Freq: Once | INTRAVENOUS | Status: DC

## 2021-01-09 MED ORDER — SODIUM CHLORIDE 0.9 % IV SOLN
INTRAVENOUS | Status: DC
Start: 2021-01-09 — End: 2021-02-08
  Administered 2021-01-09: 150 mL/h via INTRAVENOUS

## 2021-01-09 MED ORDER — ONDANSETRON HCL 4 MG/2ML IJ SOLN: 4.0000 mg | Freq: Four times a day (QID) | INTRAMUSCULAR | Status: DC | PRN

## 2021-01-09 MED ORDER — ACETAMINOPHEN 325 MG PO TABS: 650.0000 mg | ORAL_TABLET | Freq: Four times a day (QID) | ORAL | Status: DC | PRN

## 2021-01-09 NOTE — Progress Notes (Deleted)
Patient states he has been taking metformin but he has not taken it for the past 4-5 days. States the pharmacy did not receive any refills. Prior to this he was taking two pills a day. BG checks at home were reported as "high".   Productive cough with clear phlegm since thanksgiving. States he is vaccinated and boosted against COVID-19. He denies any SOB or discoloration to his mucus production.   Meds- Losartan-Hctz 100-25 mg  Indomethacin 50 mg TID prn  Allopurinol 100 mg daily

## 2021-01-09 NOTE — Progress Notes (Signed)
Pt is positive for Covid, they called from lab. Pt in negative pressure room, pt informed and educated about the negative pressure room.  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

## 2021-01-09 NOTE — Assessment & Plan Note (Signed)
Glucose has been elevated at home for the past three weeks. He was previously supposed to be started on Farxiga and his metformin increased, but this was not done and he has been taking 1000 qd. He endorses increased urinary frequency and polydipsia. His mouth feels dry. He endorses nausea, vomiting, and intermittent diarrhea.  Cough, congestion, chills sinus pressure for the past three weeks. He recently started having shortness of breath with exertion as well. No fever. Tested for covid this morning and test is still pending. He also endorses chest pain at random.  BP 474 systolic decreased from usual, tachycardic to 105. Last took blood pressure medications this morning.    - stat ECG shows LAD, possible atrial enlargement and sinus tachycardia unchanged from prior  -given 1L bolus LR, novolog 8U - stat CBC within normal limits, BMP, UA, troponin - BMP shows hyperkalemia of 5.3, AKI with Cr of 1.97, baseline .9, pseudohyponatremia with correct Na of 127.  - received covid test earlier today, still pending.  - continued fluids 150cc/hr  - will be admitted to hospital

## 2021-01-09 NOTE — H&P (Signed)
Date: 01/09/2021               Patient Name:  [name] MRN: [id number]  DOB: 1961-10-29 Age / Sex: 60 y.o., male   PCP: Gaylan Gerold, DO         Medical Service: Internal Medicine Teaching Service         Attending Physician: Dr. Evette Doffing    First Contact: Dr. Wynetta Emery Pager: [phone]  Second Contact: Dr. Laural Golden Pager: [phone]       After Hours (After 5p/  First Contact Pager: [phone]  weekends / holidays): Second Contact Pager: [phone]   Chief Complaint: Urinary frequency, increased thirst  History of Present Illness: [name] is a 60 year old male living with T2DM, HTN, HLD and gout who presented to [facility]09[facility]21 for evaluation of increased thirst, urinary frequency, fatigue and cough found to have AKI, hyperglycemia and hyperkalemia. Patient was subsequently admitted from clinic for further evaluation and management of his condition.  Patient reports that since late November, he has experienced progressively worsening symptoms of urinary frequency, increased thirst, and fatigue. He states that he has been drinking a "lot of juice" and urinating five to six times throughout the evening and frequently during the day. Despite drinking plenty of fluids, he states that he continues to feel dehydrated and endorses having a dry mouth. He states that he has been taking metformin 1000mg  as prescribed every morning, however he ran out of this medication four or five days ago. His symptoms progressively worsened so he checked his blood glucose at home two days ago which was 445 then greater than 600 yesterday. In addition to these symptoms, he endorses a cough of clear phlegm during this time as well. He previously had COVID-19 infection last year and has since received two full-dose vaccinations against COVID and a booster. He has also received the influenza vaccination. He denies any shortness of breath, fevers, chills, leg swelling, abdominal pain, or pain with  urination.  Clinic Course: On arrival to the clinic, patient was tachycardic (105), but otherwise hemodynamically stable, afebrile, saturating at 96% on room air. Initial fingerstick was 569; HbA1c of 12.2 elevated from 7.7 four months prior; BMP remarkable for sodium of 119, potassium of 5.3, chloride of 85, bicarbonate of 20, anion gap of 15, creatinine of 1.97; CBC was unremarkable; troponin of 13; urinalysis with glucose >500, negative ketones. and COVID swab in process. He received 1L bolus of LR and 8 units of novolog. He was admitted to IMTS for further evaluation and treatment.  Meds:  Allopurinol 100mg  daily Atorvastatin 40mg  daily Losartan-hydrochlorothiazide 100-25mg  daily Metformin 1000mg  daily  Allergies: Allergies as of 01/09/2021 - Review Complete 09/08/2020  Allergen Reaction Noted  . Tramadol Itching 12/02/2019   Past Medical History:  Diagnosis Date  . Allergy   . Arthritis   . Chronic lower back pain   . Diabetes mellitus without complication ([place])   . GERD (gastroesophageal reflux disease)   . Gout   . Hyperlipidemia   . Hypertension   . Seasonal allergies    Family History:  Mother and sister have T2DM. Father's medical history not known.  Social History: Patient lives in [place] with his wife. He has six daughters. He reports drinking a couple of beers per day and up to six airplane shots per day. He reports his last drink was about two days ago. He reports drinking approximately one pack of cigarettes per week from the age of 64 to 89.  He denies recreational drug use.  Review of Systems: A complete ROS was negative except as per HPI.  Physical Exam: Blood pressure 119/75, pulse 94, temperature 98.2 F (36.8 C), temperature source Oral, resp. rate 19, SpO2 97 %. Physical Exam Vitals and nursing note reviewed.  Constitutional:      General: He is not in acute distress.    Appearance: Normal appearance.  HENT:     Head: Normocephalic and  atraumatic.     Mouth/Throat:     Mouth: Mucous membranes are moist.     Pharynx: Oropharynx is clear.  Eyes:     Extraocular Movements: Extraocular movements intact.     Conjunctiva/sclera: Conjunctivae normal.  Cardiovascular:     Rate and Rhythm: Normal rate and regular rhythm.     Pulses: Normal pulses.     Heart sounds: Normal heart sounds.  Pulmonary:     Effort: Pulmonary effort is normal.     Breath sounds: Normal breath sounds.  Abdominal:     General: Abdomen is flat. Bowel sounds are normal.     Palpations: Abdomen is soft.     Tenderness: There is no abdominal tenderness.  Musculoskeletal:        General: Normal range of motion.     Right lower leg: No edema.     Left lower leg: No edema.  Skin:    General: Skin is warm and dry.     Capillary Refill: Capillary refill takes less than 2 seconds.  Neurological:     General: No focal deficit present.     Mental Status: He is alert. Mental status is at baseline.  Psychiatric:        Mood and Affect: Mood normal.        Behavior: Behavior normal.        Thought Content: Thought content normal.        Judgment: Judgment normal.    EKG: personally reviewed my interpretation is sinus rhythm with left axis deviation and possible left atrial enlargement.  Assessment & Plan by Problem: Active Problems:   Hyperkalemia  [name] is a 60 year old male living with T2DM, HTN, HLD and gout who presented to [facility] on 01/09/21 for evaluation of increased thirst, urinary frequency, fatigue and cough found to have hyperglycemia, mild hyperkalemia, AKI.  #AKI, active #Mild hyperkalemia, active Patient presenting with creatinine of 1.97 with a baseline of 0.9-1. BUN/Cr greater than 20. Acute kidney injury likely pre-renal in the setting of significant dehydration. Patient has received 1L bolus of LR in clinic. We will continue IVF for now and reassess response to fluids. Potassium of 5.3 should respond to fluids and insulin,  however we will recheck on repeat BMP. -Repeat BMP tonight and tomorrow morning -100cc/hr for five hours -Telemetry  #Hyperglycemia, active Patient not in hyperglycemic crisis. During clinic visit in September, plan was to increase his metformin to 1000mg  twice daily and start dapagliflozin 5mg  daily due to elevated HbA1c from 7.0 to 7.7, however patient did not receive these updated medications. Hyperglycemia likely secondary to incomplete treatment of his diabetes and inability to adhere to prescribed metformin due to lack of refills from pharmacy. Concern for possible upper respiratory infection which may have exacerbated his blood glucose readings. He has received 8 units of novolog in the clinic. -CBG monitoring -Q4h SSI, moderate  #Cough, active #Prior history of significant tobacco use Patient reports cough for past two months. Unclear etiology at this time, however COVID remains a consideration. -COVID/Flu/RSV swab  #  Alcohol use disorder, chronic Patient reports significant alcohol use. He denies any symptoms of withdrawal currently or previously. -CIWA without ativan  #Mild hyponatremia, active Patient's sodium 119 on initial BMP, however corrected sodium of 131. -Repeat BMP tonight and tomorrow morning  #HTN, chronic Patient's blood pressure well controlled on home regimen of losartan-HCTZ 100-25mg  daily. Due to his AKI, we will hold this medication for now. -Hold losartan-hctz 100-25 mg   #HLD, chronic -Hold atorvastatin 40mg  daily  #Gout, chronic Patient denies any recent gout flares. He reports adherence to home regimen of allopurinol 100mg  daily. - Continue allopurinol 100 mg  #Code status: Full code #VTE ppx: Lovenox #Diet: Carb modified #Bowel regimen: None  Dispo: Admit patient to Observation with expected length of stay less than 2 midnights.  Signed: Cato Mulligan, MD 01/09/2021, 6:59 PM  Pager: [phone] After 5pm on weekdays and 1pm on weekends:  On Call pager: [phone]

## 2021-01-09 NOTE — Progress Notes (Signed)
Pt just arrived to 6N32 via wheelchair from the Internal Med clinic. Trying to enter into the system.

## 2021-01-09 NOTE — Progress Notes (Signed)
Report given to Hulan Amato, RN 6 [place].  Patient  transported via wheelchair to [place].   Wife with patient.  IV N/S continues to at 150/cc hr via 22 Gauge in right arm.  Sander Nephew, RN 5:36 PM.

## 2021-01-09 NOTE — Progress Notes (Signed)
   CC: hyperglycemia  HPI:  [name] is a 60 y.o. with PMH as below.   Please see A&P for assessment of the patient's acute and chronic medical conditions.   Glucose has been elevated at home for the past three weeks. He was previously supposed to be started on Farxiga and his metformin increased, but this was not done and he has been taking 1000 qd. He endorses increased urinary frequency and polydipsia. His mouth feels dry. He endorses nausea, vomiting, and intermittent diarrhea.  Cough, congestion, chills sinus pressure for the past three weeks. He recently started having shortness of breath with exertion as well. No fever. Tested for covid this morning and test is still pending. He also endorses chest pain at random.   Past Medical History:  Diagnosis Date  . Allergy   . Arthritis   . Chronic lower back pain   . Diabetes mellitus without complication ([place])   . GERD (gastroesophageal reflux disease)   . Gout   . Hyperlipidemia   . Hypertension   . Seasonal allergies    Review of Systems:   10 point ROS negative except as noted in HPI  Physical Exam: Constitution: acutely ill appearing, appears stated age HENT: dry mucous membranes Cardio: RRR, no m/r/g, no LE edema  Respiratory: upper airway wheezing, decreased breath sounds LL bilaterally, otherwise CTA  Abdominal: mild TTP, soft, non-distended MSK: moving all extremities Neuro: normal affect, a&ox3 Skin: c/d/i   Vitals:   01/09/21 1437  BP: 102/82  Pulse: (!) 105  Temp: 97.6 F (36.4 C)  TempSrc: Oral  SpO2: 96%  Weight: 206 lb 9.6 oz (93.7 kg)    Assessment & Plan:   See Encounters Tab for problem based charting.  Patient discussed with Dr. Daryll Drown

## 2021-01-09 NOTE — Progress Notes (Signed)
CBG upon arrival to dept 6N was 453. Called Dr Rama with Internal med service and she stated to give the 15 units of novolog insulin. Asked for Diabetes consult order also.

## 2021-01-09 NOTE — Telephone Encounter (Signed)
Spoke with pt and offered to change appt to telehealth visit, but wife insisted pt doesn't feel well, glucometer reading was "Hi" both last night and this morning.  Wife states he hasnt been able to get his metformin refilled and has been out for a few days now.  Spoke with attending-ok to have pt seen in negative pressure room for hyperglycemia.Despina Hidden Cassady1/11/202212:09 PM

## 2021-01-10 DIAGNOSIS — M109 Gout, unspecified: Secondary | ICD-10-CM | POA: Diagnosis not present

## 2021-01-10 DIAGNOSIS — E860 Dehydration: Secondary | ICD-10-CM | POA: Diagnosis present

## 2021-01-10 DIAGNOSIS — Z833 Family history of diabetes mellitus: Secondary | ICD-10-CM | POA: Diagnosis not present

## 2021-01-10 DIAGNOSIS — Z7984 Long term (current) use of oral hypoglycemic drugs: Secondary | ICD-10-CM | POA: Diagnosis not present

## 2021-01-10 DIAGNOSIS — Z91138 Patient's unintentional underdosing of medication regimen for other reason: Secondary | ICD-10-CM | POA: Diagnosis not present

## 2021-01-10 DIAGNOSIS — E785 Hyperlipidemia, unspecified: Secondary | ICD-10-CM | POA: Diagnosis present

## 2021-01-10 DIAGNOSIS — E1165 Type 2 diabetes mellitus with hyperglycemia: Secondary | ICD-10-CM

## 2021-01-10 DIAGNOSIS — E1100 Type 2 diabetes mellitus with hyperosmolarity without nonketotic hyperglycemic-hyperosmolar coma (NKHHC): Secondary | ICD-10-CM

## 2021-01-10 DIAGNOSIS — F1010 Alcohol abuse, uncomplicated: Secondary | ICD-10-CM | POA: Diagnosis present

## 2021-01-10 DIAGNOSIS — F17210 Nicotine dependence, cigarettes, uncomplicated: Secondary | ICD-10-CM | POA: Diagnosis present

## 2021-01-10 DIAGNOSIS — N179 Acute kidney failure, unspecified: Secondary | ICD-10-CM | POA: Diagnosis present

## 2021-01-10 DIAGNOSIS — U071 COVID-19: Secondary | ICD-10-CM | POA: Diagnosis present

## 2021-01-10 DIAGNOSIS — T383X6A Underdosing of insulin and oral hypoglycemic [antidiabetic] drugs, initial encounter: Secondary | ICD-10-CM | POA: Diagnosis present

## 2021-01-10 DIAGNOSIS — E876 Hypokalemia: Secondary | ICD-10-CM | POA: Diagnosis present

## 2021-01-10 DIAGNOSIS — I10 Essential (primary) hypertension: Secondary | ICD-10-CM

## 2021-01-10 DIAGNOSIS — M1A9XX0 Chronic gout, unspecified, without tophus (tophi): Secondary | ICD-10-CM | POA: Diagnosis present

## 2021-01-10 DIAGNOSIS — Z79899 Other long term (current) drug therapy: Secondary | ICD-10-CM | POA: Diagnosis not present

## 2021-01-10 HISTORY — DX: Type 2 diabetes mellitus with hyperglycemia: E11.65

## 2021-01-10 HISTORY — DX: COVID-19: U07.1

## 2021-01-10 HISTORY — DX: Type 2 diabetes mellitus with hyperosmolarity without nonketotic hyperglycemic-hyperosmolar coma (NKHHC): E11.00

## 2021-01-10 LAB — GLUCOSE, CAPILLARY
Glucose-Capillary: 198 mg/dL — ABNORMAL HIGH (ref 70–99)
Glucose-Capillary: 207 mg/dL — ABNORMAL HIGH (ref 70–99)
Glucose-Capillary: 291 mg/dL — ABNORMAL HIGH (ref 70–99)
Glucose-Capillary: 292 mg/dL — ABNORMAL HIGH (ref 70–99)
Glucose-Capillary: 301 mg/dL — ABNORMAL HIGH (ref 70–99)
Glucose-Capillary: 303 mg/dL — ABNORMAL HIGH (ref 70–99)
Glucose-Capillary: 344 mg/dL — ABNORMAL HIGH (ref 70–99)
Glucose-Capillary: 388 mg/dL — ABNORMAL HIGH (ref 70–99)
Glucose-Capillary: 453 mg/dL — ABNORMAL HIGH (ref 70–99)

## 2021-01-10 LAB — BASIC METABOLIC PANEL
Anion gap: 12 (ref 5–15)
BUN: 30 mg/dL — ABNORMAL HIGH (ref 6–20)
CO2: 23 mmol/L (ref 22–32)
Calcium: 9.4 mg/dL (ref 8.9–10.3)
Chloride: 95 mmol/L — ABNORMAL LOW (ref 98–111)
Creatinine, Ser: 1.24 mg/dL (ref 0.61–1.24)
GFR, Estimated: >60 mL/min (ref >60)
Glucose, Bld: 190 mg/dL — ABNORMAL HIGH (ref 70–99)
Potassium: 3.2 mmol/L — ABNORMAL LOW (ref 3.5–5.1)
Sodium: 130 mmol/L — ABNORMAL LOW (ref 135–145)

## 2021-01-10 MED ORDER — POTASSIUM CHLORIDE 20 MEQ PO PACK
40.0000 meq | PACK | Freq: Two times a day (BID) | ORAL | Status: DC
  Administered 2021-01-10: 40 meq via ORAL
  Filled 2021-01-10: qty 2

## 2021-01-10 MED ORDER — POTASSIUM CHLORIDE 20 MEQ PO PACK
40.0000 meq | PACK | Freq: Once | ORAL | Status: AC
  Administered 2021-01-10: 40 meq via ORAL
  Filled 2021-01-10: qty 2

## 2021-01-10 MED ORDER — INSULIN GLARGINE 100 UNIT/ML ~~LOC~~ SOLN
20.0000 [IU] | Freq: Every day | SUBCUTANEOUS | Status: DC
  Administered 2021-01-10: 20 [IU] via SUBCUTANEOUS
  Filled 2021-01-10 (×2): qty 0.2

## 2021-01-10 MED ORDER — INSULIN STARTER KIT- PEN NEEDLES (ENGLISH): 1.0000 | Freq: Once | Status: DC

## 2021-01-10 MED ORDER — INSULIN ASPART 100 UNIT/ML ~~LOC~~ SOLN
0.0000 [IU] | SUBCUTANEOUS | Status: DC
  Administered 2021-01-10 – 2021-01-11 (×3): 15 [IU] via SUBCUTANEOUS
  Administered 2021-01-11: 11 [IU] via SUBCUTANEOUS

## 2021-01-10 MED ORDER — LACTATED RINGERS IV SOLN: INTRAVENOUS | Status: AC

## 2021-01-10 NOTE — Progress Notes (Signed)
In conversation with the patient he mentioned that his insurance company had contacted his wife to inquire about his admission. He states that they can't be charging him for medications because he's taking his own from home. I asked the patient where his meds were and he said they are in the cabinet. Medications retrieved and given to his spouse to take home. Contacted on call physician and informed him that patient was taking his home medications and that he had taken them for today already. He states he will address with the team in the morning unless something on his med list is pressing. Will pass on to the oncoming shift in report.

## 2021-01-10 NOTE — Progress Notes (Signed)
Inpatient Diabetes Program Recommendations  AACE/ADA: New Consensus Statement on Inpatient Glycemic Control (2015)  Target Ranges:  Prepandial:   less than 140 mg/dL      Peak postprandial:   less than 180 mg/dL (1-2 hours)      Critically ill patients:  140 - 180 mg/dL   Lab Results  Component Value Date   GLUCAP 292 (H) 01/10/2021   HGBA1C 12.2 (A) 01/09/2021    Review of Glycemic Control  Results for [name], [name] (MRN [id number]) as of 01/10/2021 12:41  Ref. Range 01/10/2021 00:10 01/10/2021 04:39 01/10/2021 08:21 01/10/2021 09:22 01/10/2021 11:55  Glucose-Capillary Latest Ref Range: 70 - 99 mg/dL 291 (H) 198 (H) 207 (H) 344 (H) 292 (H)   Diabetes history:  DM2 Outpatient Diabetes medications: Metformin 1000 mg qam, farxiga (never filled) Current orders for Inpatient glycemic control:  Lantus 20 units daily  Novolog 0-15 units Q4H  Inpatient Diabetes Program Recommendations:    Note:  Noted Lantus 20 units QD started today.  Spoke with patient on the phone.  Reviewed patient's current A1c of 12.2%. Explained what a A1c is and what it measures. Also reviewed goal A1c with patient, importance of good glucose control @ home, and blood sugar goals.  He admits to drinking a lot of juice and liquor.  He ran out of his Metformin for approximately 1 week due to prescription issues with pharmacy.  Per patient, he will be discharging on insulin. He has used insulin in the past with a vial and syringe and prefers this method.  Discussed hypoglycemia, signs, symptoms and treatments.  Attached education to AVS.  He will need close follow up with his PCP.  He has a functioning glucometer at home and will check his blood sugar at least 2 x a day.  He is to call PCP with CBG/s consistently < 100 mg/dL or > 200 mg/dL.  He states he will get back on track.  All questions answered.    Will continue to follow while inpatient.  Thank you, Reche Dixon, RN, BSN Diabetes Coordinator Inpatient Diabetes  Program [phone] (team pager from 8a-5p)

## 2021-01-10 NOTE — Progress Notes (Addendum)
Subjective:  Overnight, no acute events.  This morning, patient reports feeling better this morning. He states he is sleepy. Tolerating PO intake well. Woke up 4x to urinate overnight. He has been drinking plenty of water. He had an omelette for breakfast. He endorses some abdominal cramping he attributes to his coughing. Six years ago he was hospitalized for something similar and required insulin for a few weeks. He is comfortable self administering insulin upon discharge. He understands and agrees that his wife should be tested for COVID given that he tested positive.    Objective:  Vital signs in last 24 hours: Vitals:   01/09/21 1850 01/09/21 2110 01/10/21 0303 01/10/21 0621  BP: 119/75 106/75 103/69 95/77  Pulse: 94 98 82 83  Resp: 19 18 18 19   Temp: 98.2 F (36.8 C) 99 F (37.2 C) 98.2 F (36.8 C) 98.2 F (36.8 C)  TempSrc: Oral Oral Oral Oral  SpO2: 97% 97% 96% 98%  SpO2: 98 %  Intake/Output Summary (Last 24 hours) at 01/10/2021 1327 Last data filed at 01/10/2021 3845 Gross per 24 hour  Intake 1240.96 ml  Output 675 ml  Net 565.96 ml  There were no vitals filed for this visit.   Physical Exam Vitals reviewed.  Constitutional:      General: He is not in acute distress.    Appearance: Normal appearance.  HENT:     Mouth/Throat:     Mouth: Mucous membranes are moist.     Pharynx: Oropharynx is clear.  Cardiovascular:     Rate and Rhythm: Normal rate and regular rhythm.     Pulses: Normal pulses.     Heart sounds: Normal heart sounds.  Pulmonary:     Effort: Pulmonary effort is normal. No respiratory distress.     Breath sounds: Normal breath sounds.  Abdominal:     General: Abdomen is flat. Bowel sounds are normal.     Palpations: Abdomen is soft.     Tenderness: There is no abdominal tenderness.  Musculoskeletal:     Right lower leg: No edema.     Left lower leg: No edema.  Skin:    General: Skin is warm and dry.     Capillary Refill: Capillary refill  takes less than 2 seconds.  Neurological:     General: No focal deficit present.     Mental Status: He is alert. Mental status is at baseline.     BMP Latest Ref Rng & Units 01/10/2021 01/09/2021 01/09/2021  Glucose 70 - 99 mg/dL 190(H) 404(H) 596(HH)  BUN 6 - 20 mg/dL 30(H) 38(H) 45(H)  Creatinine 0.61 - 1.24 mg/dL 1.24 1.74(H) 1.97(H)  BUN/Creat Ratio 10 - 24 - - -  Sodium 135 - 145 mmol/L 130(L) 127(L) 119(LL)  Potassium 3.5 - 5.1 mmol/L 3.2(L) 5.0 5.3(H)  Chloride 98 - 111 mmol/L 95(L) 91(L) 85(L)  CO2 22 - 32 mmol/L 23 23 20(L)  Calcium 8.9 - 10.3 mg/dL 9.4 9.7 9.7   Glucose 24h - 388, 292, 344, 207, 198, 291, 436, 467, 569 COVID positive   Assessment/Plan:  Principal Problem:   Hyperosmolar hyperglycemic state (HHS) ([place]) Active Problems:   AKI (acute kidney injury) ([place])   Hyperkalemia   COVID-19 virus infection  Mr. Nayden Czajka is a 60 year old male living with T2DM, HTN, HLD and gout who presented to [facility] on 01/09/21 for evaluation of increased thirst, urinary frequency, fatigue and cough found to have COVID, hyperglycemia, mild hyperkalemia, and AKI.  #AKI, improving Baseline of 0.9-1.  Creatinine trending down from 1.97 to 1.24 following 1L bolus and 500cc/hr for five hours. -Repeat BMP tomorrow morning -Encourage PO intake   #Probable HHS HbA1c elevated to 12.2 from 7.7. He has received 34 units of novolog with improvement of his blood glucose from 569 to 198 overnight. This morning, patient having gradually worsening hyperglycemia, most recently to 388 despite continued moderate Q4H insulin administration. -Escalate to Q4H SSI, resistant (0-20 units) -Start lantus 20 units every morning -CBG monitoring   #COVID-19, active Patient endorses ongoing cough for several weeks found to have COVID-19 infection. He is currently saturating well on room air and afebrile. -Robitussin Q4H PRN -Airborne and contact precautions -SpO2 monitoring  #Hypokalemia,  active Following administration of 34 units of novolog, patient's potassium now 3.2 from 5.3 on arrival to clinic. -Replete potassium, 55mEq potassium chloride twice today   #Alcohol use disorder, chronic Patient reports significant alcohol use. He denies any symptoms of withdrawal. -CIWA without ativan   #HTN, chronic Patient's blood pressure well controlled on home regimen of losartan-HCTZ 100-25mg  daily. Due to his AKI, we will hold this medication for now. -Hold losartan-hctz 100-25 mg    #HLD, chronic -Hold atorvastatin 40mg  daily   #Gout, chronic Patient denies any recent gout flares. He reports adherence to home regimen of allopurinol 100mg  daily. - Continue allopurinol 100 mg   #Code status: Full code #VTE ppx: Enoxaparin 40mg  daily #Diet: Carb modified #Bowel regimen: None  Cato Mulligan, MD 01/10/2021, 1:27 PM Pager: [phone] After 5pm on weekdays and 1pm on weekends: On Call pager [phone]

## 2021-01-11 LAB — GLUCOSE, CAPILLARY
Glucose-Capillary: 228 mg/dL — ABNORMAL HIGH (ref 70–99)
Glucose-Capillary: 254 mg/dL — ABNORMAL HIGH (ref 70–99)
Glucose-Capillary: 301 mg/dL — ABNORMAL HIGH (ref 70–99)

## 2021-01-11 LAB — BASIC METABOLIC PANEL
Anion gap: 11 (ref 5–15)
BUN: 28 mg/dL — ABNORMAL HIGH (ref 6–20)
CO2: 21 mmol/L — ABNORMAL LOW (ref 22–32)
Calcium: 9.1 mg/dL (ref 8.9–10.3)
Chloride: 97 mmol/L — ABNORMAL LOW (ref 98–111)
Creatinine, Ser: 1.19 mg/dL (ref 0.61–1.24)
GFR, Estimated: >60 mL/min (ref >60)
Glucose, Bld: 221 mg/dL — ABNORMAL HIGH (ref 70–99)
Potassium: 3.9 mmol/L (ref 3.5–5.1)
Sodium: 129 mmol/L — ABNORMAL LOW (ref 135–145)

## 2021-01-11 MED ORDER — INSULIN GLARGINE 100 UNIT/ML ~~LOC~~ SOLN: 25.0000 [IU] | Freq: Every day | SUBCUTANEOUS | 11 refills | Status: DC

## 2021-01-11 MED ORDER — HYDROCHLOROTHIAZIDE 25 MG PO TABS
25.0000 mg | ORAL_TABLET | Freq: Every day | ORAL | Status: DC
  Administered 2021-01-11: 25 mg via ORAL
  Filled 2021-01-11: qty 1

## 2021-01-11 MED ORDER — LOSARTAN POTASSIUM-HCTZ 100-25 MG PO TABS: 1.0000 | ORAL_TABLET | Freq: Every day | ORAL | Status: DC

## 2021-01-11 MED ORDER — METFORMIN HCL ER 500 MG PO TB24: 1000.0000 mg | ORAL_TABLET | Freq: Every day | ORAL | 1 refills | Status: DC

## 2021-01-11 MED ORDER — INSULIN GLARGINE 100 UNIT/ML ~~LOC~~ SOLN
25.0000 [IU] | Freq: Every day | SUBCUTANEOUS | Status: DC
  Administered 2021-01-11: 25 [IU] via SUBCUTANEOUS
  Filled 2021-01-11: qty 0.25

## 2021-01-11 MED ORDER — LOSARTAN POTASSIUM 50 MG PO TABS
100.0000 mg | ORAL_TABLET | Freq: Every day | ORAL | Status: DC
Start: 2021-01-11 — End: 2021-01-11
  Administered 2021-01-11: 100 mg via ORAL
  Filled 2021-01-11: qty 2

## 2021-01-11 NOTE — Progress Notes (Signed)
Inpatient Diabetes Program Recommendations  AACE/ADA: New Consensus Statement on Inpatient Glycemic Control (2015)  Target Ranges:  Prepandial:   less than 140 mg/dL      Peak postprandial:   less than 180 mg/dL (1-2 hours)      Critically ill patients:  140 - 180 mg/dL   Lab Results  Component Value Date   GLUCAP 254 (H) 01/11/2021   HGBA1C 12.2 (A) 01/09/2021    Review of Glycemic Control Results for AVID, GUILLETTE (MRN [id number]) as of 01/11/2021 10:40  Ref. Range 01/10/2021 12:56 01/10/2021 17:04 01/10/2021 21:08 01/11/2021 01:31 01/11/2021 08:03  Glucose-Capillary Latest Ref Range: 70 - 99 mg/dL 388 (H) 303 (H) 301 (H) 228 (H) 254 (H)   Diabetes history:  DM2 Current orders for Inpatient glycemic control:  Lantus 25 units daily Novolog 0-20 units Q4H  Inpatient Diabetes Program Recommendations:    Please consider Novolog 0-20 units TID with meals as he is eating and Novolog 0-5 units QHS  Will continue to follow while inpatient.  Thank you, Reche Dixon, RN, BSN Diabetes Coordinator Inpatient Diabetes Program [phone] (team pager from 8a-5p)

## 2021-01-11 NOTE — Discharge Summary (Addendum)
Name: [name] MRN: [id number] DOB: 06-Dec-1961 60 y.o. PCP: Gaylan Gerold, DO  Date of Admission: 01/09/2021  6:31 PM Date of Discharge: 01/11/2021 Attending Physician: Axel Filler, *  Discharge Diagnosis: 1. Principal Problem:   Hyperosmolar hyperglycemic state (HHS) ([place]) Active Problems:   AKI (acute kidney injury) ([place])   Hyperkalemia   COVID-19 virus infection  Discharge Medications: Allergies as of 01/11/2021      Reactions   Tramadol Itching   Increases blood pressure      Medication List    TAKE these medications   Albuterol Sulfate 108 (90 Base) MCG/ACT Aepb Commonly known as: ProAir RespiClick Inhale 2 puffs into the lungs every 6 (six) hours as needed.   allopurinol 100 MG tablet Commonly known as: ZYLOPRIM Take 1 tablet (100 mg total) by mouth daily.   atorvastatin 40 MG tablet Commonly known as: LIPITOR Take 1 tablet (40 mg total) by mouth daily.   glucose blood test strip Commonly known as: FREESTYLE TEST STRIPS Use as instructed   indomethacin 50 MG capsule Commonly known as: INDOCIN Take 1 capsule (50 mg total) by mouth 3 (three) times daily as needed. What changed: reasons to take this   insulin glargine 100 UNIT/ML injection Commonly known as: LANTUS Inject 0.25 mLs (25 Units total) into the skin daily.   losartan-hydrochlorothiazide 100-25 MG tablet Commonly known as: Hyzaar Take 1 tablet by mouth daily.   metFORMIN 500 MG 24 hr tablet Commonly known as: GLUCOPHAGE-XR Take 2 tablets (1,000 mg total) by mouth daily with breakfast. TAKE 2 TABLETS BY MOUTH EVERY DAY WITH BREAKFAST What changed:   how much to take  how to take this  when to take this   ReliOn Confirm Glucose Monitor w/Device Kit 1 Units by Does not apply route once.      Disposition and follow-up:   [name] was discharged from [facility] in Stable condition.  At the hospital follow up visit please address:  1.  T2DM:  Patient presented to [facility] on 1/11 found to have HbA1c of 12.2 up from 7.7 three months prior and hyperglycemia ~600 in the setting of COVID-19 infection and nonadherence to metformin 1023m daily. He was admitted to the hospital for management of hyperglycemia/mild HHS. He was discharged on 25 units of lantus every morning and continuation of 1,0084mmetformin daily. He is unable to afford [facility] Gladeo alternative oral regimens may be considered. He likely would benefit from further uptitration of his metformin.  2.  Labs / imaging needed at time of follow-up: BMP  3.  Pending labs/ test needing follow-up: none  Follow-up Appointments:  Follow-up Information    [facility] as soon as possible for a visit in 1 week(s).   Contact information: [address] by problem list: 1. Hyperglycemia/mild HHS - Patient presented to clinic with blood glucose reading of approximately 600 with severe dehydration and AKI. His blood sugars initially responded well to Q4H moderate SSI, however he had gradual worsening of his hyperglycemia approximately 12 hours following admission. He was started on long-acting insulin 20 units daily and resistant SSI Q4H with improvement in his blood sugars. He was discharged on 25 units of lantus every morning with plan to frequently check blood glucose at home, resume metformin 100081maily and [facility] He  was evaluated by diabetes coordinator who determined that he did not require education or assistance with insulin administration and he was discharged with appropriate supplies.  2. AKI - Patient had acute kidney injury with creatinine elevated to 1.97 from baseline of approximately 1.0. Creatinine of 1.19 on day of discharge following 1.5L IVF and oral hydration.   3. COVID-19: Patient tested positive for  Sars-Cov2 on admission (01/09/21). He endorsed cough but was otherwise hemodynamically stable, afebrile, and saturating well on room air. Inflammatory markers were not obtained and patient was not started on dexamethasone, remdesivir or baricitinib. He did not require supplemental oxygen. His wife was encouraged to get tested and he was informed that he would need to isolate for ten days from diagnosis.  Discharge Vitals:   BP 115/70 (BP Location: Right Arm)   Pulse 68   Temp 98.1 F (36.7 C) (Oral)   Resp 18   SpO2 97%   Pertinent Labs, Studies, and Procedures:  CBC Latest Ref Rng & Units 01/09/2021 05/08/2018 04/30/2018  WBC 4.0 - 10.5 K/uL 6.2 13.2(H) 9.6  Hemoglobin 13.0 - 17.0 g/dL 15.8 11.5(L) 13.9  Hematocrit 39.0 - 52.0 % 42.6 35.2(L) 41.3  Platelets 150 - 400 K/uL 282 276 300   CMP Latest Ref Rng & Units 01/11/2021 01/10/2021 01/09/2021  Glucose 70 - 99 mg/dL 221(H) 190(H) 404(H)  BUN 6 - 20 mg/dL 28(H) 30(H) 38(H)  Creatinine 0.61 - 1.24 mg/dL 1.19 1.24 1.74(H)  Sodium 135 - 145 mmol/L 129(L) 130(L) 127(L)  Potassium 3.5 - 5.1 mmol/L 3.9 3.2(L) 5.0  Chloride 98 - 111 mmol/L 97(L) 95(L) 91(L)  CO2 22 - 32 mmol/L 21(L) 23 23  Calcium 8.9 - 10.3 mg/dL 9.1 9.4 9.7  Total Protein 6.5 - 8.1 g/dL - - -  Total Bilirubin 0.3 - 1.2 mg/dL - - -  Alkaline Phos 38 - 126 U/L - - -  AST 15 - 41 U/L - - -  ALT 0 - 44 U/L - - -   COVID positive - 01/09/21  No imaging  Discharge Instructions: Discharge Instructions    Call MD for:  difficulty breathing, headache or visual disturbances   Complete by: As directed    Call MD for:  persistant dizziness or light-headedness   Complete by: As directed    Call MD for:  persistant nausea and vomiting   Complete by: As directed    Call MD for:  temperature >100.4   Complete by: As directed    Diet Carb Modified   Complete by: As directed    Discharge instructions   Complete by: As directed    [name],   You were hospitalized due to your  high potassium, acute kidney injury and high blood sugars, all of which have improved. We believe this may have happened due to your COVID-19 infection. I want you to continue isolating for a total of 10 days, until 01/19/2021.   I want you to follow up in the [facility] clinic in 1 week after your quarantine ends.   Please note the following changes to your medications:  START taking Lantus 25 units daily (insulin injections) CONTINUE taking metformin 1000 mg daily   The clinic should give you a call to set up an appointment for next Friday. In the meantime, if you notice your blood sugars remain >250 or <150 please give the clinic a call as we may need to adjust your insulin.  Thank you for allowing [place] to be a part of  your care!   Increase activity slowly   Complete by: As directed      Signed: Cato Mulligan, MD 01/11/2021, 1:39 PM   Pager: [phone]

## 2021-01-11 NOTE — Progress Notes (Signed)
Subjective:  Overnight, no acute events.  This morning, patient reports that he feels well. He states that him and his wife will be diligent about checking his blood sugars and taking his medications as prescribed. He states that the [place] cost him approximately $500 so that medication will not be an option for him upon discharge. He understands how to administer insulin correctly and has no further questions or concerns.  Objective:  Vital signs in last 24 hours: Vitals:   01/10/21 1352 01/10/21 2110 01/11/21 0643 01/11/21 0840  BP: 105/71 117/70 115/70   Pulse: 91 96 68   Resp:  18 18   Temp: 98.2 F (36.8 C) 99.1 F (37.3 C) 98.1 F (36.7 C)   TempSrc: Oral Oral Oral   SpO2: 99% 99% 98% 97%  On room air  Intake/Output Summary (Last 24 hours) at 01/11/2021 1004 Last data filed at 01/11/2021 0537 Gross per 24 hour  Intake 1362.9 ml  Output -  Net 1362.9 ml  There were no vitals filed for this visit.   Physical Exam Vitals reviewed.  Constitutional:      General: He is not in acute distress.    Appearance: Normal appearance.  HENT:     Mouth/Throat:     Mouth: Mucous membranes are moist.     Pharynx: Oropharynx is clear.  Cardiovascular:     Rate and Rhythm: Normal rate and regular rhythm.     Pulses: Normal pulses.     Heart sounds: Normal heart sounds.  Pulmonary:     Effort: Pulmonary effort is normal. No respiratory distress.     Breath sounds: Normal breath sounds.  Abdominal:     General: Abdomen is flat. Bowel sounds are normal.     Palpations: Abdomen is soft.     Tenderness: There is no abdominal tenderness.  Musculoskeletal:     Right lower leg: No edema.     Left lower leg: No edema.  Skin:    General: Skin is warm and dry.     Capillary Refill: Capillary refill takes less than 2 seconds.  Neurological:     General: No focal deficit present.     Mental Status: He is alert. Mental status is at baseline.    Labs in past 24 hours: BMP Latest  Ref Rng & Units 01/11/2021 01/10/2021 01/09/2021  Glucose 70 - 99 mg/dL 221(H) 190(H) 404(H)  BUN 6 - 20 mg/dL 28(H) 30(H) 38(H)  Creatinine 0.61 - 1.24 mg/dL 1.19 1.24 1.74(H)  BUN/Creat Ratio 10 - 24 - - -  Sodium 135 - 145 mmol/L 129(L) 130(L) 127(L)  Potassium 3.5 - 5.1 mmol/L 3.9 3.2(L) 5.0  Chloride 98 - 111 mmol/L 97(L) 95(L) 91(L)  CO2 22 - 32 mmol/L 21(L) 23 23  Calcium 8.9 - 10.3 mg/dL 9.1 9.4 9.7   Glucose 24h - 254, 228, 301, 303, 388, 292, 344, 207  Imaging in past 24 hours: No results found.  Assessment/Plan:  Principal Problem:   Hyperosmolar hyperglycemic state (HHS) ([place]) Active Problems:   AKI (acute kidney injury) ([place])   Hyperkalemia   COVID-19 virus infection  Mr. Terell Kincy is a 60 year old male with past medical history significant for T2DM, HTN, HLD and gout who presented to [facility] on 01/09/21 for evaluation of increased thirst, urinary frequency, fatigue and cough found to have COVID, mild hyperosmolar hyperglycemic state, mild hyperkalemia, and AKI.  #AKI, resolved Baseline of approximately 1.0. Creatinine trended down from 1.97 to 1.19 most recently. -Continue encouraging adequate  oral intake   #Mild hyperosmolar hyperglycemic state, resolved HbA1c elevated to 12.2 from 7 three months prior. He received 43 units of novolog and 20 units of lantus yesterday with improvement of his blood glucose to low/mid 200s this morning. He would benefit from uptitration of his long-acting insulin and close follow-up in the outpatient setting. -Increase lantus to 25 units every morning -Encouraged close CBG monitoring at home -Restart 1,000mg  metformin every morning upon discharge -Close follow-up with [facility]   #COVID-19, active Patient endorses mild cough, however he is saturating well on room air. -Robitussin Q4H PRN -Isolate for ten days since positive test result on 01/09/21 (may end isolation on 01/19/21) -Wife has been encouraged to get tested  #Hypokalemia,  resolved Potassium 3.9 on morning labs.   #Alcohol use disorder, chronic Patient reports significant alcohol use. He denies any symptoms of withdrawal. -CIWA without ativan -Encourage to abstain from alcohol consumption   #HTN, chronic Patient's home losartan-HCTZ 100-25mg  daily was initially held due to AKI, however patient was taking his home supply during hospitalization. -Resume losartan-HCTZ 100-25mg  daily  #HLD, chronic -Resume atorvastatin 40mg  daily   #Gout, chronic Patient denies any recent gout flares. He reports adherence to home regimen of allopurinol 100mg  daily. - Continue allopurinol 100 mg   #Code status: Full code #VTE ppx: Enoxaparin 40mg  daily #Diet: Carb modified #Bowel regimen: None  Cato Mulligan, MD 01/11/2021, 10:04 AM Pager: [phone] After 5pm on weekdays and 1pm on weekends: On Call pager [phone]

## 2021-01-12 ENCOUNTER — Telehealth: Payer: Self-pay | Admitting: *Deleted

## 2021-01-12 LAB — NOVEL CORONAVIRUS, NAA: SARS-CoV-2, NAA: DETECTED — AB

## 2021-01-12 MED ORDER — INSULIN GLARGINE 100 UNIT/ML ~~LOC~~ SOLN: 25.0000 [IU] | Freq: Every day | SUBCUTANEOUS | 11 refills | Status: DC

## 2021-01-12 NOTE — Telephone Encounter (Signed)
Patient's wife called in stating patient d/c yesterday from [facility]. Lantus was sent to [facility] but he never received it. Please resend Rx to [facility] on [address]

## 2021-01-12 NOTE — Telephone Encounter (Signed)
Rx sent for Lantus 25U daily to [facility] on [address].

## 2021-01-16 NOTE — Progress Notes (Signed)
Internal Medicine Clinic Attending  Case discussed with Dr. Seawell  At the time of the visit.  We reviewed the resident's history and exam and pertinent patient test results.  I agree with the assessment, diagnosis, and plan of care documented in the resident's note.  

## 2021-01-22 ENCOUNTER — Other Ambulatory Visit: Payer: Self-pay

## 2021-01-22 ENCOUNTER — Ambulatory Visit: Payer: BC Managed Care – PPO | Admitting: Internal Medicine

## 2021-01-22 ENCOUNTER — Encounter: Payer: Self-pay | Admitting: Internal Medicine

## 2021-01-22 VITALS — BP 148/93 | HR 93 | Temp 98.1°F | Ht 71.0 in | Wt 218.9 lb

## 2021-01-22 DIAGNOSIS — R809 Proteinuria, unspecified: Secondary | ICD-10-CM

## 2021-01-22 DIAGNOSIS — N179 Acute kidney failure, unspecified: Secondary | ICD-10-CM | POA: Diagnosis not present

## 2021-01-22 DIAGNOSIS — E1100 Type 2 diabetes mellitus with hyperosmolarity without nonketotic hyperglycemic-hyperosmolar coma (NKHHC): Secondary | ICD-10-CM

## 2021-01-22 DIAGNOSIS — I10 Essential (primary) hypertension: Secondary | ICD-10-CM

## 2021-01-22 DIAGNOSIS — E1129 Type 2 diabetes mellitus with other diabetic kidney complication: Secondary | ICD-10-CM

## 2021-01-22 DIAGNOSIS — E1165 Type 2 diabetes mellitus with hyperglycemia: Secondary | ICD-10-CM

## 2021-01-22 NOTE — Assessment & Plan Note (Addendum)
BP at this visit is 148/93. Patient is on losartan-HCTZ 100-25mg  daily. He notes medication compliance. He denies any headache, vision changes, lightheadedness/dizziness, or focal weakness.  Plan: Continue current regimen Consider addition of amlodipine at next visit if BP remains elevated

## 2021-01-22 NOTE — Patient Instructions (Addendum)
Mr [name],   It was a pleasure seeing you in clinic. Today we discussed:   Diabetes:  At this time, please increase your metformin to 1,000mg  twice daily (total 2,000mg  per day). Please continue to check your blood sugars at home. If you are experiencing more hypoglycemic episodes, you may decrease the amount of insulin you are using.   I am checking on labs today. I will call you with any abnormal results.  If you have any questions or concerns, please call our clinic at [phone] between 9am-5pm and after hours call [phone] and ask for the internal medicine resident on call. If you feel you are having a medical emergency please call 911.   Thank you, we look forward to helping you remain healthy!

## 2021-01-22 NOTE — Assessment & Plan Note (Signed)
BMP Latest Ref Rng & Units 01/22/2021 01/11/2021 01/10/2021  Glucose 65 - 99 mg/dL 125(H) 221(H) 190(H)  BUN 8 - 27 mg/dL 24 28(H) 30(H)  Creatinine 0.76 - 1.27 mg/dL 1.13 1.19 1.24  BUN/Creat Ratio 10 - 24 21 - -  Sodium 134 - 144 mmol/L 142 129(L) 130(L)  Potassium 3.5 - 5.2 mmol/L 4.5 3.9 3.2(L)  Chloride 96 - 106 mmol/L 104 97(L) 95(L)  CO2 20 - 29 mmol/L 22 21(L) 23  Calcium 8.6 - 10.2 mg/dL 10.6(H) 9.1 9.4   Patient noted to have an AKI on recent admission; likely pre-renal in etiology as it improved with fluid resuscitation. Patient endorses maintaining good hydration following discharge and denies any urinary symptoms at this time. AKI seems to have improved on most recent labs.   Plan: Continue to encourage good hydration

## 2021-01-22 NOTE — Progress Notes (Signed)
   CC: f/u hospitalization for HHS  HPI:  Mr.Elgar Eriksen is a 60 y.o. male with PMHx as listed below presenting for follow up of his recent hospitalization. He was admitted for hyperosmolar hyperglycemic state on 01/09/2021 in setting of recent COVID19 infection. He was also noted to have an AKI at this visit that improved with fluid resuscitation. Since discharge, patient has been endorsing good appetite. He has been taking Lantus 25U daily with good response. Please see problem based charting for complete assessment and plan.  Past Medical History:  Diagnosis Date  . Allergy   . Arthritis   . Chronic lower back pain   . Diabetes mellitus without complication ([place])   . GERD (gastroesophageal reflux disease)   . Gout   . Hyperlipidemia   . Hypertension   . Seasonal allergies    Review of Systems:  Negative except as stated in HPI.  Physical Exam:  Vitals:   01/22/21 1411  BP: (!) 148/93  Pulse: 93  Temp: 98.1 F (36.7 C)  TempSrc: Oral  SpO2: 98%  Weight: 218 lb 14.4 oz (99.3 kg)  Height: 5\' 11"  (1.803 m)   Physical Exam  Constitutional: Appears well-developed and well-nourished. No distress.  HENT: Normocephalic and atraumatic, EOMI, conjunctiva normal, moist mucous membranes Cardiovascular: Normal rate, regular rhythm, S1 and S2 present, no murmurs, rubs, gallops.  Distal pulses intact Musculoskeletal: Normal bulk and tone.  No peripheral edema noted. Neurological: Is alert and oriented x4, no apparent focal deficits noted. Skin: Warm and dry.  No rash, erythema, lesions noted.  Assessment & Plan:   See Encounters Tab for problem based charting.  Patient discussed with Dr. Philipp Ovens

## 2021-01-23 LAB — BMP8+ANION GAP
Anion Gap: 16 mmol/L (ref 10.0–18.0)
BUN/Creatinine Ratio: 21 (ref 10–24)
BUN: 24 mg/dL (ref 8–27)
CO2: 22 mmol/L (ref 20–29)
Calcium: 10.6 mg/dL — ABNORMAL HIGH (ref 8.6–10.2)
Chloride: 104 mmol/L (ref 96–106)
Creatinine, Ser: 1.13 mg/dL (ref 0.76–1.27)
GFR calc Af Amer: 81 mL/min/{1.73_m2} (ref >59)
GFR calc non Af Amer: 70 mL/min/{1.73_m2} (ref >59)
Glucose: 125 mg/dL — ABNORMAL HIGH (ref 65–99)
Potassium: 4.5 mmol/L (ref 3.5–5.2)
Sodium: 142 mmol/L (ref 134–144)

## 2021-01-23 MED ORDER — METFORMIN HCL ER 500 MG PO TB24: 1000.0000 mg | ORAL_TABLET | Freq: Two times a day (BID) | ORAL | 1 refills | Status: DC

## 2021-01-23 NOTE — Assessment & Plan Note (Signed)
[name] is following up from his recent hospitalization on 01/09/2021 for HHS. Patient with previously fairly controlled diabetes noted to have three weeks of elevated glucose in post-COVID setting. Admitted for severe symptomatic hyperglycemia and treated with long acting insulin with resistant SSI. He was discharged on Lantus 25U which he has been taking in addition to metformin 1000mg  daily. Patient is checking his blood glucose 3-4 times daily and brought the readings with him. He did have initial readings >200; however, this has improved over the past 3-4 days with average CBG's in 150's. He did have one hypoglycemic episode of 95 in the evening when he missed a meal. This was corrected with food intake. He denies any further hypoglycemic events.  Suspect that patient's uncontrolled diabetes is in setting of recent COVID infection and, as he recovers, his blood glucose levels will improve.  Patient and wife advised to increase metformin to 1000mg  twice daily and to titrate down the lantus with goal CBG 90-180.   Plan: Metformin 1000mg  twice daily Titrate down Lantus with goal CBG 90-180 F/u in 2 weeks

## 2021-01-24 ENCOUNTER — Telehealth: Payer: Self-pay

## 2021-01-24 NOTE — Telephone Encounter (Signed)
Notified pt about his form back to work has been completed and ready for pick up.

## 2021-01-31 NOTE — Progress Notes (Signed)
Internal Medicine Clinic Attending  Case discussed with Dr. Marva Panda  At the time of the visit.  We reviewed the resident's history and exam and pertinent patient test results.  I agree with the assessment, diagnosis, and plan of care documented in the resident's note.   I would not recommend advising patient to down titrate lantus without a specific plan. Given he is still uncontrolled, recommend continuing current lantus dose while increasing metformin. Can plan to down titrate at follow up if CBGs at goal.

## 2021-02-08 ENCOUNTER — Encounter: Payer: Self-pay | Admitting: Internal Medicine

## 2021-02-08 ENCOUNTER — Ambulatory Visit: Payer: BC Managed Care – PPO | Admitting: Internal Medicine

## 2021-02-08 DIAGNOSIS — Z794 Long term (current) use of insulin: Secondary | ICD-10-CM | POA: Diagnosis not present

## 2021-02-08 DIAGNOSIS — Z7984 Long term (current) use of oral hypoglycemic drugs: Secondary | ICD-10-CM

## 2021-02-08 DIAGNOSIS — R809 Proteinuria, unspecified: Secondary | ICD-10-CM

## 2021-02-08 DIAGNOSIS — E1129 Type 2 diabetes mellitus with other diabetic kidney complication: Secondary | ICD-10-CM

## 2021-02-08 NOTE — Patient Instructions (Signed)
It was nice seeing you today! Thank you for choosing [facility] for your Primary Care.    Today we talked about:   Diabetes: Your sugars are coming down great!   - Continue Lantus 15 units before bedtime.  - Lantus 10 units starts on February 15th.  - Lantus 5 units from February 16 - 25th.  - Follow up in the week after you stop Lantus completely. However, if you sugar goes above 180, do not continue the taper and continue the Lantus dose you were previously on.

## 2021-02-09 ENCOUNTER — Other Ambulatory Visit: Payer: Self-pay | Admitting: Student

## 2021-02-09 ENCOUNTER — Encounter: Payer: Self-pay | Admitting: Internal Medicine

## 2021-02-09 DIAGNOSIS — M109 Gout, unspecified: Secondary | ICD-10-CM

## 2021-02-09 NOTE — Progress Notes (Signed)
   CC: Diabetes  HPI:  [name] is a 60 y.o. with a PMHx as listed below who presents to the clinic for Diabetes.   Please see the Encounters tab for problem-based Assessment & Plan regarding status of patient's acute and chronic conditions.  Past Medical History:  Diagnosis Date  . Allergy   . Arthritis   . Chronic lower back pain   . Diabetes mellitus without complication ([place])   . GERD (gastroesophageal reflux disease)   . Gout   . Hyperlipidemia   . Hypertension   . Seasonal allergies    Review of Systems: Review of Systems  Constitutional: Negative for chills, fever and weight loss.  Cardiovascular: Negative for chest pain.  Gastrointestinal: Negative for abdominal pain, diarrhea, nausea and vomiting.  Neurological: Negative for dizziness and headaches.   Physical Exam:  Vitals:   02/08/21 1433 02/08/21 1534  BP: (!) 157/100 (!) 142/98  Pulse: 85 75  Temp: 98.4 F (36.9 C)   TempSrc: Oral   SpO2: 98%   Weight: 224 lb 12.8 oz (102 kg)    Physical Exam Vitals and nursing note reviewed.  Constitutional:      General: He is not in acute distress.    Appearance: He is normal weight.  Pulmonary:     Effort: Pulmonary effort is normal. No respiratory distress.  Skin:    General: Skin is warm and dry.  Neurological:     General: No focal deficit present.     Mental Status: He is alert and oriented to person, place, and time. Mental status is at baseline.  Psychiatric:        Mood and Affect: Mood normal.        Behavior: Behavior normal.    Assessment & Plan:   See Encounters Tab for problem based charting.  Patient discussed with Dr. Heber Ali

## 2021-02-09 NOTE — Assessment & Plan Note (Signed)
Mr. Matus is here to discuss his diabetes.  He has continued to follow his Lantus titration instructions and did bring his CBGs with him.  He has slowly weaned himself from 25 units at bedtime to 15 units at bedtime.  Since being on 15 units/day for the last 5 nights, his morning CBGs average at 140.  When he was on the 20 units, his average at that time was a little bit higher, approximately 160.  However given that on 15 units his sugars have been okay, I instructed patient it is okay to keep moving forward with a taper.  -5 additional days of 15 units before bedtime -Followed by 10 days of 10 units before bedtime -Followed by 10 days of 5 units before bedtime -Taper to finish on February 25.  Instructed to follow-up approximately 3 to 5 days afterwards. -Instructions given that his morning CBGs above 180, to return to the previous dosage and to contact the clinic. -Continue Metformin

## 2021-02-15 NOTE — Progress Notes (Signed)
Internal Medicine Clinic Attending  Case discussed with Dr. Basaraba  At the time of the visit.  We reviewed the resident's history and exam and pertinent patient test results.  I agree with the assessment, diagnosis, and plan of care documented in the resident's note.  

## 2021-03-01 ENCOUNTER — Ambulatory Visit (INDEPENDENT_AMBULATORY_CARE_PROVIDER_SITE_OTHER): Payer: BC Managed Care – PPO | Admitting: Student

## 2021-03-01 ENCOUNTER — Encounter: Payer: Self-pay | Admitting: Student

## 2021-03-01 ENCOUNTER — Other Ambulatory Visit: Payer: Self-pay

## 2021-03-01 VITALS — BP 132/78 | HR 77 | Temp 97.9°F | Ht 71.0 in | Wt 222.7 lb

## 2021-03-01 DIAGNOSIS — E1129 Type 2 diabetes mellitus with other diabetic kidney complication: Secondary | ICD-10-CM

## 2021-03-01 DIAGNOSIS — I10 Essential (primary) hypertension: Secondary | ICD-10-CM | POA: Diagnosis not present

## 2021-03-01 DIAGNOSIS — M5136 Other intervertebral disc degeneration, lumbar region: Secondary | ICD-10-CM | POA: Diagnosis not present

## 2021-03-01 DIAGNOSIS — R809 Proteinuria, unspecified: Secondary | ICD-10-CM

## 2021-03-01 DIAGNOSIS — G8929 Other chronic pain: Secondary | ICD-10-CM | POA: Insufficient documentation

## 2021-03-01 DIAGNOSIS — M542 Cervicalgia: Secondary | ICD-10-CM | POA: Diagnosis not present

## 2021-03-01 MED ORDER — EMPAGLIFLOZIN 10 MG PO TABS: 10.0000 mg | ORAL_TABLET | Freq: Every day | ORAL | 3 refills | Status: DC

## 2021-03-01 NOTE — Patient Instructions (Signed)
[name],  It is a pleasure seeing you in the clinic today.  Here is a summary what we talked about:  1.  Back pain and neck pain: Please make an appointment with [facility], give [place] a call if they need a referral.  Please continue taking Tylenol.  Can try Voltaren gel as needed.   2.  Diabetes: You can stop the Lantus.  Continue Metformin 1 g 2 times a day.  I will start Jardiance 10 mg daily.  Please call the office if this medication is not covered.  We will check your hemoglobin A1c in 3 months.  3.  Hypertension: Continue the current medication.  The blood pressure looks great today.  Take care  Dr. Alfonse Spruce

## 2021-03-01 NOTE — Assessment & Plan Note (Addendum)
Patient reports an injury of his lumbar in 2018.  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  Patient has seen [facility] and received 2-3 steroid injection which per patient did not help.  He also had physical therapy, which made his pain worse.  No red flag: no urinary or bowel incontinence, no weakness of bilateral lower extremities, normal neuro physical exam today.   His current regimen is Tylenol, electronic massage device and resting.  Patient is working at [employer] which he has to do a lot of heavy lifting.  He states that it is challenging to have light duty work there.   -Advised patient to call or [facility] to make a follow-up appointment -Continue Tylenol as needed -Advised patient to try Voltaren gel -Advised patient to avoid heavy maneuver that can exacerbate his pain

## 2021-03-01 NOTE — Assessment & Plan Note (Signed)
Blood pressure is in good control. Continue Hyzaar 100-25 mg daily.  

## 2021-03-01 NOTE — Assessment & Plan Note (Signed)
Patient complains of neck pain that accompany his back pain.  He was seen by [facility] but his neck pain was not addressed there.  He states that his neck pain is worse with movement.  States that he cannot sleep with a pillow due to his neck pain.  He states that in the last few months, he experienced numbness and tingling feeling of his bilateral hands and also weaker grip.  Patient has not had any imaging of his cervical spine.  His physical exam is benign today.  He has normal strength and sensation of bilateral upper extremities.  Spurling test was negative bilaterally.   This is likely osteoarthritis vs degenerative disc disease.  No suspicion for spinal cord impingement based on physical exam today.  -Advised patient to follow-up with [facility] -Tylenol as needed -Voltaren gel as needed

## 2021-03-01 NOTE — Assessment & Plan Note (Signed)
Patient has controlled type 2 diabetes, however were hospitalized in January for HHS due to COVID-19 infection and excessive alcohol use.  He was discharged with taper Lantus and was supposed to be off of Lantus in February.  Patient reports compliant with his medication.  He still taking 5 units of Lantus daily.  His daily log shows fasting CBG in 120s.  Will discontinue Lantus.  Continue Metformin.  His insurance plan did not cover for [place].  Patient prefer p.o. medications over injection so I will start Jardiance today.   -Stop Lantus -Continue Metformin 1 g twice daily -Start Jardiance 10 mg daily.  It is emphasized to patient that he needs to call the clinic if his medication is not covered.  If Vania Rea is not covered, will start Trulicity.  Patient agrees with the plan.

## 2021-03-01 NOTE — Progress Notes (Signed)
   CC: [place] management and chronic back pain  HPI:  [name] is a 60 y.o. with past medical history of hypertension, hyperlipidemia, type 2 diabetes, who presents to the clinic today for diabetes management and chronic back/neck pain  Past Medical History:  Diagnosis Date  . Allergy   . Arthritis   . Chronic lower back pain   . COVID-19 virus infection 01/10/2021  . Diabetes mellitus without complication ([place])   . GERD (gastroesophageal reflux disease)   . Gout   . Hyperlipidemia   . Hyperosmolar hyperglycemic state (HHS) ([place]) 01/10/2021  . Hypertension   . Seasonal allergies    Review of Systems:   Review of Systems  Musculoskeletal: Positive for back pain and neck pain.  Neurological: Positive for tingling. Negative for sensory change, focal weakness and weakness.    Physical Exam:  Vitals:   03/01/21 1320  BP: 132/78  Pulse: 77  Temp: 97.9 F (36.6 C)  SpO2: 100%  Weight: 222 lb 11.2 oz (101 kg)  Height: 5\' 11"  (1.803 m)   Physical Exam Constitutional:      General: He is not in acute distress. HENT:     Head: Normocephalic.  Eyes:     General:        Right eye: No discharge.        Left eye: No discharge.  Neck:     Comments: Negative Spurling test bilaterally Cardiovascular:     Rate and Rhythm: Normal rate and regular rhythm.  Pulmonary:     Effort: Pulmonary effort is normal. No respiratory distress.  Abdominal:     General: Bowel sounds are normal.  Musculoskeletal:        General: Normal range of motion.     Cervical back: Normal range of motion. No rigidity.  Skin:    General: Skin is warm.  Neurological:     Mental Status: He is alert.     Comments: Normal strength and sensation of bilateral upper and lower extremities. Normal range of motion of bilateral upper extremities. Normal flexion and extension of lumbar   Psychiatric:        Mood and Affect: Mood normal.     Assessment & Plan:   See Encounters Tab for problem  based charting.  Patient discussed with Dr. Rebeca Alert

## 2021-03-02 ENCOUNTER — Telehealth: Payer: Self-pay | Admitting: Student

## 2021-03-02 ENCOUNTER — Other Ambulatory Visit: Payer: Self-pay | Admitting: Internal Medicine

## 2021-03-02 DIAGNOSIS — E1129 Type 2 diabetes mellitus with other diabetic kidney complication: Secondary | ICD-10-CM

## 2021-03-02 MED ORDER — DULAGLUTIDE 0.75 MG/0.5ML ~~LOC~~ SOAJ: 0.7500 mg | SUBCUTANEOUS | 3 refills | Status: DC

## 2021-03-02 NOTE — Telephone Encounter (Signed)
Patient called needing empagliflozin (JARDIANCE) 10 MG TABS tablet switch from tabs to syringes in order for insurance to pay for his medicines. He uses the   [id number] [address], [place] Phone:  [phone]  Fax:  [phone]

## 2021-03-02 NOTE — Telephone Encounter (Signed)
Attempted to spoke with [name] regarding Tony medication cost. Patient did not pick up but spoke with [name] who mentions that empagliflozin would require $500 co-pay. Per yesterday's visit note, discussed plan to trial dulaglutide if able, [name] expressed understanding. Dulaglutide sent to pharmacy.

## 2021-03-02 NOTE — Telephone Encounter (Signed)
Called pt - stated his insurance does not pay for Jardiance but will pay for "syringe".

## 2021-03-05 ENCOUNTER — Other Ambulatory Visit: Payer: Self-pay | Admitting: Student

## 2021-03-05 ENCOUNTER — Telehealth: Payer: Self-pay | Admitting: Student

## 2021-03-05 DIAGNOSIS — M5136 Other intervertebral disc degeneration, lumbar region: Secondary | ICD-10-CM

## 2021-03-05 DIAGNOSIS — E1129 Type 2 diabetes mellitus with other diabetic kidney complication: Secondary | ICD-10-CM

## 2021-03-05 MED ORDER — TRULICITY 1.5 MG/0.5ML ~~LOC~~ SOAJ: 1.5000 mg | SUBCUTANEOUS | 11 refills | Status: DC

## 2021-03-05 NOTE — Telephone Encounter (Signed)
Pt calling requesting a Referral back to [facility] .  Pt seen in Clinic on 03/01/2021 per [facility] patient will need a referral Placed.  Please advise if a new Referral can be placed.

## 2021-03-05 NOTE — Telephone Encounter (Signed)
Referral placed. Thank you

## 2021-03-06 NOTE — Progress Notes (Signed)
Internal Medicine Clinic Attending  Case discussed with Dr. Alfonse Spruce at the time of the visit.  We reviewed the resident's history and exam and pertinent patient test results.  I agree with the assessment, diagnosis, and plan of care documented in the resident's note.  Lenice Pressman, M.D., Ph.D.

## 2021-07-03 ENCOUNTER — Encounter: Payer: Self-pay | Admitting: *Deleted

## 2021-08-27 ENCOUNTER — Other Ambulatory Visit: Payer: Self-pay | Admitting: Internal Medicine

## 2021-08-27 DIAGNOSIS — E1129 Type 2 diabetes mellitus with other diabetic kidney complication: Secondary | ICD-10-CM

## 2021-09-10 ENCOUNTER — Ambulatory Visit (INDEPENDENT_AMBULATORY_CARE_PROVIDER_SITE_OTHER): Payer: BC Managed Care – PPO | Admitting: Internal Medicine

## 2021-09-10 ENCOUNTER — Other Ambulatory Visit: Payer: Self-pay

## 2021-09-10 ENCOUNTER — Encounter: Payer: Self-pay | Admitting: Internal Medicine

## 2021-09-10 ENCOUNTER — Other Ambulatory Visit: Payer: Self-pay | Admitting: Internal Medicine

## 2021-09-10 VITALS — BP 154/109 | HR 90 | Temp 98.6°F | Ht 71.0 in | Wt 224.8 lb

## 2021-09-10 DIAGNOSIS — Z23 Encounter for immunization: Secondary | ICD-10-CM | POA: Diagnosis not present

## 2021-09-10 DIAGNOSIS — E1129 Type 2 diabetes mellitus with other diabetic kidney complication: Secondary | ICD-10-CM

## 2021-09-10 DIAGNOSIS — R809 Proteinuria, unspecified: Secondary | ICD-10-CM

## 2021-09-10 DIAGNOSIS — M1A49X0 Other secondary chronic gout, multiple sites, without tophus (tophi): Secondary | ICD-10-CM

## 2021-09-10 DIAGNOSIS — M109 Gout, unspecified: Secondary | ICD-10-CM | POA: Diagnosis not present

## 2021-09-10 DIAGNOSIS — I10 Essential (primary) hypertension: Secondary | ICD-10-CM

## 2021-09-10 DIAGNOSIS — E1169 Type 2 diabetes mellitus with other specified complication: Secondary | ICD-10-CM

## 2021-09-10 DIAGNOSIS — E785 Hyperlipidemia, unspecified: Secondary | ICD-10-CM

## 2021-09-10 LAB — POCT GLYCOSYLATED HEMOGLOBIN (HGB A1C): Hemoglobin A1C: 6.8 % — AB (ref 4.0–5.6)

## 2021-09-10 LAB — GLUCOSE, CAPILLARY: Glucose-Capillary: 169 mg/dL — ABNORMAL HIGH (ref 70–99)

## 2021-09-10 MED ORDER — INDOMETHACIN 50 MG PO CAPS: 50.0000 mg | ORAL_CAPSULE | Freq: Three times a day (TID) | ORAL | 2 refills | Status: DC | PRN

## 2021-09-10 MED ORDER — METFORMIN HCL ER 500 MG PO TB24: 1000.0000 mg | ORAL_TABLET | Freq: Two times a day (BID) | ORAL | 3 refills | Status: DC

## 2021-09-10 MED ORDER — ALLOPURINOL 100 MG PO TABS: 100.0000 mg | ORAL_TABLET | Freq: Every day | ORAL | 5 refills | Status: DC

## 2021-09-10 MED ORDER — ATORVASTATIN CALCIUM 40 MG PO TABS
40.0000 mg | ORAL_TABLET | Freq: Every day | ORAL | 5 refills | Status: DC
Start: 2021-09-10 — End: 2022-10-16

## 2021-09-10 MED ORDER — LOSARTAN POTASSIUM-HCTZ 100-25 MG PO TABS: 1.0000 | ORAL_TABLET | Freq: Every day | ORAL | 3 refills | Status: DC

## 2021-09-10 MED ORDER — METFORMIN HCL ER (MOD) 1000 MG PO TB24: 1000.0000 mg | ORAL_TABLET | Freq: Two times a day (BID) | ORAL | 3 refills | Status: DC

## 2021-09-10 NOTE — Progress Notes (Signed)
   CC: routine visit/medication refills  HPI:  [name] is a 60 y.o. male with HTN, HLD, diabetes, and DDD of the lumbar spine who presents to the [facility] for routine visit and for medication refills. Please see problem-based list for further details, assessments, and plans.   Past Medical History:  Diagnosis Date   Allergy    Arthritis    Chronic lower back pain    COVID-19 virus infection 01/10/2021   Diabetes mellitus without complication ([facility])    GERD (gastroesophageal reflux disease)    Gout    Hyperlipidemia    Hyperosmolar hyperglycemic state (HHS) ([place]) 01/10/2021   Hypertension    Seasonal allergies    Review of Systems:  Review of Systems  Constitutional:  Negative for chills and fever.  HENT:  Negative for sinus pain and sore throat.   Eyes:  Negative for blurred vision and double vision.  Respiratory:  Negative for cough, shortness of breath and wheezing.   Cardiovascular:  Negative for chest pain, palpitations and leg swelling.  Gastrointestinal:  Negative for abdominal pain, diarrhea, nausea and vomiting.  Genitourinary:  Negative for dysuria, frequency and urgency.  Musculoskeletal: Negative.   Neurological:  Negative for dizziness and headaches.    Physical Exam:  Vitals:   09/10/21 1016  BP: (!) 173/102  Pulse: 71  Temp: 98.6 F (37 C)  TempSrc: Oral  SpO2: 98%  Weight: 224 lb 12.8 oz (102 kg)  Height: '5\' 11"'$  (1.803 m)   General: No acute distress. CV: RRR. No murmurs, rubs, or gallops. No LE edema Pulmonary: Lungs CTAB. Normal effort. No wheezing or rales. Abdominal: Soft, nontender, nondistended. Normal bowel sounds. Extremities: Palpable radial and DP pulses. Normal ROM. Skin: Warm and dry. No obvious rash or lesions. Neuro: A&Ox3. Moves all extremities. Normal sensation. No focal deficit. Psych: Normal mood and affect   Assessment & Plan:   See Encounters Tab for problem based charting.  Patient seen with Dr. Daryll Drown

## 2021-09-10 NOTE — Assessment & Plan Note (Signed)
Flu shot performed today

## 2021-09-10 NOTE — Assessment & Plan Note (Signed)
Patient continues to take metformin 1g BID and was taking Trulicity 1.'5mg'$  once weekly for 3 months, until he ran out of this. HbA1c today was 6.8 and the patient is feeling well overall.   Plan: - Continue metformin 1g BID - Discontinue Trulicity - Will recheck a1c in 3 months to evaluate need for Trulicity again at that time - Urine micro and foot exam performed today

## 2021-09-10 NOTE — Assessment & Plan Note (Signed)
No recent gout flares.  Plan: - Refill allopurinol and indomethacin

## 2021-09-10 NOTE — Addendum Note (Signed)
Addended by: Buddy Duty on: 09/10/2021 02:22 PM   Modules accepted: Orders

## 2021-09-10 NOTE — Assessment & Plan Note (Signed)
No issues with medication at this time. Overall stable  Plan: - Continue atorvastatin '40mg'$  daily

## 2021-09-10 NOTE — Telephone Encounter (Signed)
See pharmacy comment regarding metformin refill  Pharmacy comment: need to switch to Metformin ER not Modified.  This particular MOD or modified version (glumetza) is over $1000

## 2021-09-10 NOTE — Assessment & Plan Note (Signed)
SBP was in the 170s initially on exam today. Repeat BP 154/109. Patient denies any headache, lightheadedness, chest pain, palpitations, or any other sxs at this time. He reports that he has been off of his losartan-hctz, as he was confused how to take this medication.  Plan: - Resume losartan-hctz 100-25 once daily  - Recheck BP at next visit in 3 mos

## 2021-09-10 NOTE — Patient Instructions (Signed)
Thank you, [name] for allowing [place] to provide your care today. Today we discussed: Diabetes: Your A1c is 6.8, which is excellent! Continue taking your metformin '1000mg'$  twice a day. No more need for the Trulicity injections. We will recheck this in 3 months. Please also make an appt to see your eye doctor High blood pressure: Resume taknig your losartan-hctz once a day and we will recheck your BP High cholesterol: Continue lipitor '40mg'$  daily   Healthcare maintenance: Flu shot done today  I have ordered the following labs for you:   Lab Orders         Microalbumin / Creatinine Urine Ratio         Glucose, capillary         POC Hbg A1C        Referrals ordered today:   Referral Orders  No referral(s) requested today     I have ordered the following medication/changed the following medications:   Stop the following medications: Medications Discontinued During This Encounter  Medication Reason   indomethacin (INDOCIN) 50 MG capsule Reorder   losartan-hydrochlorothiazide (HYZAAR) 100-25 MG tablet Reorder   atorvastatin (LIPITOR) 40 MG tablet Reorder   allopurinol (ZYLOPRIM) 100 MG tablet Reorder   metFORMIN (GLUCOPHAGE-XR) 500 MG 24 hr tablet Reorder     Start the following medications: Meds ordered this encounter  Medications   metFORMIN (GLUMETZA) 1000 MG (MOD) 24 hr tablet    Sig: Take 1 tablet (1,000 mg total) by mouth 2 (two) times daily.    Dispense:  180 tablet    Refill:  3   losartan-hydrochlorothiazide (HYZAAR) 100-25 MG tablet    Sig: Take 1 tablet by mouth daily.    Dispense:  90 tablet    Refill:  3   indomethacin (INDOCIN) 50 MG capsule    Sig: Take 1 capsule (50 mg total) by mouth 3 (three) times daily as needed.    Dispense:  30 capsule    Refill:  2   atorvastatin (LIPITOR) 40 MG tablet    Sig: Take 1 tablet (40 mg total) by mouth daily.    Dispense:  90 tablet    Refill:  5   allopurinol (ZYLOPRIM) 100 MG tablet    Sig: Take 1 tablet (100 mg  total) by mouth daily.    Dispense:  30 tablet    Refill:  5    DX Code Needed  .     Follow up: 3 months    Should you have any questions or concerns please call the internal medicine clinic at [phone].     Buddy Duty, D.O. [place]

## 2021-09-11 LAB — MICROALBUMIN / CREATININE URINE RATIO
Creatinine, Urine: 55.1 mg/dL
Microalb/Creat Ratio: 585 mg/g creat — ABNORMAL HIGH (ref 0–29)
Microalbumin, Urine: 322.6 ug/mL

## 2021-09-12 NOTE — Progress Notes (Signed)
Internal Medicine Clinic Attending ° °I saw and evaluated the patient.  I personally confirmed the key portions of the history and exam documented by Dr. Atway and I reviewed pertinent patient test results.  The assessment, diagnosis, and plan were formulated together and I agree with the documentation in the resident’s note.  °

## 2021-09-14 ENCOUNTER — Other Ambulatory Visit (HOSPITAL_COMMUNITY): Payer: Self-pay

## 2021-09-28 DIAGNOSIS — Z789 Other specified health status: Secondary | ICD-10-CM | POA: Insufficient documentation

## 2021-09-28 DIAGNOSIS — G894 Chronic pain syndrome: Secondary | ICD-10-CM | POA: Insufficient documentation

## 2021-09-28 DIAGNOSIS — M899 Disorder of bone, unspecified: Secondary | ICD-10-CM | POA: Insufficient documentation

## 2021-09-28 DIAGNOSIS — Z79899 Other long term (current) drug therapy: Secondary | ICD-10-CM | POA: Insufficient documentation

## 2021-09-28 NOTE — Progress Notes (Signed)
Patient: [name]  Service Category: E/M  Provider: Gaspar Cola, MD  DOB: 1961-07-26  DOS: 10/01/2021  Referring Provider: Charlotte Sanes, MD  MRN: [id number]  Setting: Ambulatory outpatient  PCP: Gaylan Gerold, DO  Type: New Patient  Specialty: Interventional Pain Management    Location: Office  Delivery: Face-to-face     Primary Reason(s) for Visit: Encounter for initial evaluation of one or more chronic problems (new to examiner) potentially causing chronic pain, and posing a threat to normal musculoskeletal function. (Level of risk: High) CC: No chief complaint on file.  HPI  [name] is a 60 y.o. year old, male patient, who comes for the first time to our practice referred by Charlotte Sanes, MD for our initial evaluation of his chronic pain. He has Gout; Essential hypertension; Tobacco use disorder; Type 2 diabetes mellitus with proteinuria ([place]); Chronic low back pain (1ry area of Pain) (Bilateral) (L>R) w/ sciatica (Left); Need for hepatitis C screening test; Alcohol use; Hyperlipidemia associated with type 2 diabetes mellitus ([place]); Asthma in adult; History of hip replacement; Screening for colon cancer; Need for immunization against influenza; Onychomycosis; DDD (degenerative disc disease), lumbar; Lumbar radiculopathy; Lumbar spondylosis; AKI (acute kidney injury) ([place]); Hyperkalemia; Chronic neck pain (Posterior) (Bilateral) (R>L); Chronic pain syndrome; Pharmacologic therapy; Disorder of skeletal system; Problems influencing health status; Chronic lower extremity pain (2ry area of Pain) (Bilateral) (L>R); Encounter related to worker's compensation claim; Lumbosacral radiculopathy at S1 (Left); Lumbar back sprain, sequela; Lumbosacral facet arthropathy (Multilevel) (Bilateral); 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 Today he comes in for evaluation of his No chief complaint on  file.  Pain Assessment: Location: Right, Left Back Radiating: pain radiaties down both leg to his foot Onset: More than a month ago Duration: Chronic pain Quality: Sharp, Stabbing, Aching, Burning, Constant, Shooting, Throbbing Severity: 7 /10 (subjective, self-reported pain score)  Effect on ADL: limits my daily activities Timing: Constant Modifying factors: TEN and rest at times BP: (!) 157/105  HR: 95  Onset and Duration: Sudden and Started with work-related injury Cause of pain: Work related accident or event Severity: Getting worse, NAS-11 at its worse: 10/10, NAS-11 at its best: 7/10, NAS-11 now: 7/10, and NAS-11 on the average: 7/10 Timing: Not influenced by the time of the day Aggravating Factors: Bending, Lifiting, Prolonged standing, Walking, Walking uphill, and Walking downhill Alleviating Factors: Lying down, Medications, Resting, Sitting, and TENS Associated Problems: Depression and Fatigue Quality of Pain: Aching, Constant, Nagging, Sharp, Shooting, Stabbing, and Throbbing Previous Examinations or Tests: CT scan, MRI scan, and X-rays Previous Treatments: Narcotic medications, Physical Therapy, Steroid treatments by mouth, and TENS  According to the patient he is here referred to [place] on behalf of of his lawyer for a Worker's Compensation injury with a date of injury being (DOI) 12/31/2016.  According to the patient the primary area of pain is that of the lower back (Bilateral) (Midline) (L>R).  The patient denies any prior surgeries, or recent x-rays or MRIs.  He refers having had 1 injection done in the back which he [place] but he cannot give me any more information other than that.  In addition, the patient indicates having had some physical therapy around 2018 at which time they provided him with a TENS unit that seems to help.  However the physical therapy itself did not help with his pain and in fact it made it worse.  Although he has not  had any  recent x-rays, he indicates having had an MRI done in 2018 at [facility], which is not available for today's evaluation on the electronic medical record.  The patient also denies having had any nerve conduction test.  The patient's second area pain is that of the lower extremities (Bilateral) (L>R).  According to the patient except for the right total hip replacement in 2020, he has had no other lower extremity surgeries.  This hip replacement was done at [facility].  He indicates not remembering the name of the surgeon.  In the case of the left lower extremity he describes having(Intermittent) shooting sensations going down the leg into his foot.  He describes that they go to the bottom of the foot and what appears to be an S1 dermatomal distribution pattern.  He refers the pain to run down the leg through the lateral aspect of the leg into the foot.  In the case of the right lower extremity pain, this 1 goes through the lateral aspect of the upper leg and thigh and ends just lateral to the area below the knee.  He denies any pain, numbness, or weakness into his feet.  However, he does admit that occasionally he will trip when he is walking.  The patient's third area pain is that of the neck and the posterior aspect (Bilateral) (R>L).  He indicates that this started at the same time as the accident.  He denies any surgeries, x-rays, physical therapy, or any type of nerve block.  The patient's fourth area pain is that of the upper extremities were rather than pain being the primary symptom it is numbness and tingling. (Bilateral) (R=L).  He denies any weakness, prior surgeries, recent x-rays, physical therapy, or any type of nerve blocks.  He refers being right-handed.  The patient refers that he was offered a fusion of the lower back, but he declined that and because he has done well with his TENS unit, he is interested in knowing more about the spinal cord stimulators.  My initial impression is that he  could be a relatively good candidate for a spinal cord stimulator implant, based on the location and type of pain that he has been experiencing.  However, to know for sure we will need to further investigate what seems to be causing the pain as well as whether or not he would be a good candidate from the medical psychology standpoint and whether or not we are able to implant a device within the thoracic region.  For this reason, today I have ordered an MRI of the thoracic spine and we will do our due diligence in trying to get a copy of his lumbar MRI from 2018.  Since he was offered a lumbar fusion and he indicates that his lower extremity pain is very dependent on movement, I need to identify whether or not he has any lumbar spinal instability.  For this I will be ordering flexion-extension x-rays of the lumbar spine.  Physical exam today was within normal limits for toe walking and heel walking.  The patient was able to perform straight leg raise bilaterally with no evidence of acute radiculopathy.  Hyperextension and rotation maneuvers were positive for lumbar facet arthropathy.  Provocative Patrick maneuver demonstrated decreased range of motion of his right hip, probably secondary to the total hip replacement.  However, it also suggested some degree of bilateral sacroiliac joint arthralgia.  Review of the medical records indicates that the patient was given maximal medical improvement  rating by Charlotte Sanes, MD, a physician working for [facility].  The work status report indicates the patient to occasionally lifting up to 35 pounds, frequently lifting from floor to shoulder of 20 pounds, and pushing and pulling up to 60 pounds.  The date on the note appears to be 08/03/2020.  Apparently an evaluation for permanent impairment rating was also completed and he was given what appears to be a 5% impairment of the lower back.  There is an MRI of the lumbar spine that appears to have been done on 06/02/2017.   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  Spondylotic changes and disc degenerative changes most prominent at the L3-S1 with annular fissures at L5-S1.  There appears to also be evidence of facet arthropathy most prominent from L4-S1. At the L1-2 level there is a disc bulge indenting the ventral aspect of the dura. At L2-3 there is another disc bulge with bilateral foraminal extension and facet arthropathy with thickening of the ligamentum flavum causing mild spinal canal narrowing and bilateral mild foraminal narrowing At L3-4 there is a disc bulge with bilateral foraminal extension and facet arthropathy with thickening of the ligamentum flavum causing narrowing of the spinal canal and right mild foraminal narrowing At L4-5 there is a disc bulge with bilateral foraminal extension and facet arthropathy with thickening of the ligamentum flavum contributing to moderate spinal canal narrowing, mild effacement of the bilateral lateral recess (Bilateral) mild to moderate foraminal narrowing. At L5-S1 there is a small central and left subarticular disc protrusion superimposed on a disc bulge with bilateral foraminal extension and facet arthropathy with thickening of the ligamentum flavum contributing to moderate spinal canal narrowing, moderate effacement of the left lateral recess with impingement on the descending left S1 nerve root, left moderate to severe foraminal narrowing with impingement of the left transverse nerve root and bilateral foraminal narrowing.  Historic Controlled Substance Pharmacotherapy Review  PMP and historical list of controlled substances: Oxycodone/APAP 5/325 1 tab p.o. 4 times daily (#30) (last filled on 12/31/2019) Current opioid analgesics: .   None MME/day: 0 mg/day   Historical Monitoring: The patient  reports no history of drug use. List of  all UDS Test(s): No results found for: MDMA, COCAINSCRNUR, [place], [place], CANNABQUANT, THCU, [place] List of other Serum/Urine Drug Screening Test(s):  No results found for: AMPHSCRSER, BARBSCRSER, BENZOSCRSER, COCAINSCRSER, COCAINSCRNUR, PCPSCRSER, PCPQUANT, THCSCRSER, THCU, CANNABQUANT, OPIATESCRSER, OXYSCRSER, PROPOXSCRSER, ETH Historical Background Evaluation: [place] PMP: PDMP reviewed during this encounter. Online review of the past 31-monthperiod conducted.             PMP NARX Score Report:  Narcotic: 040 Sedative: 020 Stimulant: 000 [place] Department of public safety, offender search: (Editor, commissioningInformation) Non-contributory Risk Assessment Profile: Aberrant behavior: None observed or detected today Risk factors for fatal opioid overdose: None identified today PMP NARX Overdose Risk Score: 190 Fatal overdose hazard ratio (HR): Calculation deferred Non-fatal overdose hazard ratio (HR): Calculation deferred Risk of opioid abuse or dependence: 0.7-3.0% with doses ? 36 MME/day and 6.1-26% with doses ? 120 MME/day. Substance use disorder (SUD) risk level: See below Personal History of Substance Abuse (SUD-Substance use disorder):  Alcohol: Negative  Illegal Drugs: Negative  Rx Drugs: Negative  ORT Risk Level calculation: Low Risk  Opioid Risk Tool - 10/01/21 1129       Family History of Substance Abuse  Alcohol Positive Male    Illegal Drugs Negative    Rx Drugs Negative      Personal History of Substance Abuse   Alcohol Negative    Illegal Drugs Negative    Rx Drugs Negative      Age   Age between 67-45 years  No      History of Preadolescent Sexual Abuse   History of Preadolescent Sexual Abuse Negative or Male      Psychological Disease   Psychological Disease Negative    Depression Negative      Total Score   Opioid Risk Tool Scoring 3    Opioid Risk Interpretation Low Risk            ORT Scoring interpretation table:  Score <3 = Low Risk for SUD  Score  between 4-7 = Moderate Risk for SUD  Score >8 = High Risk for Opioid Abuse   PHQ-2 Depression Scale:  Total score:    PHQ-2 Scoring interpretation table: (Score and probability of major depressive disorder)  Score 0 = No depression  Score 1 = 15.4% Probability  Score 2 = 21.1% Probability  Score 3 = 38.4% Probability  Score 4 = 45.5% Probability  Score 5 = 56.4% Probability  Score 6 = 78.6% Probability   PHQ-9 Depression Scale:  Total score:    PHQ-9 Scoring interpretation table:  Score 0-4 = No depression  Score 5-9 = Mild depression  Score 10-14 = Moderate depression  Score 15-19 = Moderately severe depression  Score 20-27 = Severe depression (2.4 times higher risk of SUD and 2.89 times higher risk of overuse)   Pharmacologic Plan: As per protocol, I have not taken over any controlled substance management, pending the results of ordered tests and/or consults.            Initial impression: Pending review of available data and ordered tests.  Meds   Current Outpatient Medications:    Albuterol Sulfate (PROAIR RESPICLICK) 818 (90 Base) MCG/ACT AEPB, Inhale 2 puffs into the lungs every 6 (six) hours as needed., Disp: 1 each, Rfl: 0   allopurinol (ZYLOPRIM) 100 MG tablet, Take 1 tablet (100 mg total) by mouth daily., Disp: 30 tablet, Rfl: 5   atorvastatin (LIPITOR) 40 MG tablet, Take 1 tablet (40 mg total) by mouth daily., Disp: 90 tablet, Rfl: 5   Blood Glucose Monitoring Suppl (RELION CONFIRM GLUCOSE MONITOR) W/DEVICE KIT, 1 Units by Does not apply route once., Disp: 1 kit, Rfl: 0   glucose blood (FREESTYLE TEST STRIPS) test strip, Use as instructed, Disp: 100 each, Rfl: 12   indomethacin (INDOCIN) 50 MG capsule, Take 1 capsule (50 mg total) by mouth 3 (three) times daily as needed., Disp: 30 capsule, Rfl: 2   losartan-hydrochlorothiazide (HYZAAR) 100-25 MG tablet, Take 1 tablet by mouth daily., Disp: 90 tablet, Rfl: 3   metFORMIN (GLUCOPHAGE-XR) 500 MG 24 hr tablet, Take 2  tablets (1,000 mg total) by mouth 2 (two) times daily., Disp: 120 tablet, Rfl: 3   tiZANidine (ZANAFLEX) 4 MG tablet, Take 4 mg by mouth every 6 (six) hours as needed for muscle spasms., Disp: , Rfl:   Imaging Review  Knee Imaging: Knee-R DG 1-2 views: Results for orders placed in visit on 10/23/01 DG Knee 2 Views Right  Narrative FINDINGS CLINICAL DATA:     KNEE PAIN.  NO KNOWN INJURY. RIGHT KNEE 2 VIEWS: AP AND LATERAL VIEWS DEMONSTRATE PREPATELLAR SOFT TISSUE SWELLING WITH A PROBABLE SMALL KNEE JOINT EFFUSION.  THE  JOINT SPACES ARE PRESERVED.  THERE IS NO EVIDENCE OF ACUTE FRACTURE OR SUBLUXATION. IMPRESSION PREPATELLAR SOFT TISSUE SWELLING WITH PROBABLE SMALL JOINT EFFUSION.  NO OSSEOUS ABNORMALITY DEMONSTRATED.  CORRELATE CLINICALLY.  Knee-R DG 4 views: Results for orders placed during the hospital encounter of 04/15/13 DG Knee Complete 4 Views Right  Narrative *RADIOLOGY REPORT*  Clinical Data: Right knee pain and swelling.  RIGHT KNEE - COMPLETE 4+ VIEW  Comparison: Left knee radiographs dated 04/15/2013  Findings: The patient has an extensive infarct in the distal right femur as well as a small infarct in the proximal right tibia.  The there is a small exostosis of the anterior medial aspect of the proximal right tibia.  Minimal medial joint space narrowing and small osteophytes on the patella.  Large joint effusion.  IMPRESSION:  1.  Multiple bone infarcts. 2.  Large joint effusion. 3.  Minimal arthritic changes of the medial patellofemoral compartments. 4.  The presence of bilateral knee joint effusions in the absence of trauma suggests systemic arthritis/synovitis.   Original Report Authenticated By: Lorriane Shire, M.D.  Knee-L DG 4 views: Results for orders placed during the hospital encounter of 04/15/13 DG Knee Complete 4 Views Left  Narrative *RADIOLOGY REPORT*  Clinical Data: Left knee pain and swelling.  LEFT KNEE - COMPLETE 4+  VIEW  Comparison: None.  Findings: There is an extensive inhomogeneous sclerotic lesion in the distal femoral shaft, most likely a large bone infarct.  There is no cortical disruption. The proximal extent of the lesion is not appreciable.  The patient has slight medial joint space narrowing as well as tiny osteophytes on the patella.  There is a joint effusion.  IMPRESSION:  1.  No acute osseous abnormality. 2.  Arthritic changes of the medial patellofemoral compartments. 3.  Small joint effusion. 4.  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  Does the patient have a history of sickle cell disease or steroid use or other disorders that that might create a bone infarction?   Original Report Authenticated By: Lorriane Shire, M.D.  Ankle Imaging: Ankle-L DG Complete: Results for orders placed in visit on 04/20/02 DG Ankle Complete Left  Narrative FINDINGS CLINICAL DATA:  GOUT.  LEFT ANKLE AND FOOT SWELLING AND PAIN. LEFT FOOT (THREE VIEWS) THERE IS NO EVIDENCE OF FRACTURE OR DISLOCATION.  NO ARTHRITIC CHANGES ARE SEEN.  NO OTHER FOCAL BONE ABNORMALITIES ARE IDENTIFIED. IMPRESSION NORMAL STUDY. LEFT ANKLE (THREE VIEWS) DEGENERATIVE SPURRING IS SEEN INVOLVING THE TIBIOTALAR JOINT.  AN OLD INFARCT IS SEEN INVOLVING THE DISTAL TIBIAL METAPHYSIS.  THERE IS NO EVIDENCE OF FRACTURE, DISLOCATION, OR OTHER SIGNIFICANT BONE ABNORMALITY. IMPRESSION 1.  NO ACUTE BONE ABNORMALITY. 2.  TIBIOTALAR JOINT DEGENERATIVE SPURRING. 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  Foot Imaging: Foot-L DG Complete: Results for orders placed in visit on 04/20/02 DG Foot Complete Left  Narrative FINDINGS CLINICAL DATA:  GOUT.  LEFT ANKLE AND FOOT SWELLING AND PAIN. LEFT FOOT (THREE VIEWS) THERE IS NO EVIDENCE OF FRACTURE OR DISLOCATION.  NO ARTHRITIC CHANGES ARE SEEN.  NO OTHER FOCAL BONE ABNORMALITIES ARE IDENTIFIED. IMPRESSION NORMAL STUDY. LEFT ANKLE (THREE VIEWS) DEGENERATIVE SPURRING IS SEEN  INVOLVING THE TIBIOTALAR JOINT.  AN OLD INFARCT IS SEEN INVOLVING THE DISTAL TIBIAL METAPHYSIS.  THERE IS NO EVIDENCE OF FRACTURE, DISLOCATION, OR OTHER SIGNIFICANT BONE ABNORMALITY. IMPRESSION 1.  NO ACUTE BONE ABNORMALITY. 2.  TIBIOTALAR JOINT DEGENERATIVE SPURRING. 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  Hand Imaging: Hand-R DG Complete: Results for orders placed during the hospital  encounter of 02/10/15 DG Hand Complete Right  Narrative CLINICAL DATA:  Several day history of locking up of the fourth digit, pain, no known injury.  EXAM: RIGHT HAND - COMPLETE 3+ VIEW  COMPARISON:  Right hand dated December 07, 2007  FINDINGS: The bones of the right hand are adequately mineralized. There is no acute fracture nor dislocation. There is mild narrowing of the interphalangeal joints. The there is mild degenerative change of the first metacarpophalangeal joint. The other metacarpophalangeal joints are unremarkable. There is old deformity of the proximal shaft of the fifth metacarpal. The carpometacarpal joints are unremarkable. Specific attention to the fourth finger reveals no acute bony abnormality.  IMPRESSION: There is no acute bony abnormality of the right hand. There are mild age-appropriate osteoarthritic changes.   Electronically Signed By: David  [place] On: 02/10/2015 17:01  Complexity Note: Imaging results reviewed. Results shared with [name], using Layman's terms.                        ROS  Cardiovascular: High blood pressure Pulmonary or Respiratory: Quit smoking year ago and snore Neurological: No reported neurological signs or symptoms such as seizures, abnormal skin sensations, urinary and/or fecal incontinence, being born with an abnormal open spine and/or a tethered spinal cord Psychological-Psychiatric: No reported psychological or psychiatric signs or symptoms such as difficulty sleeping, anxiety, depression, delusions or hallucinations (schizophrenial), mood  swings (bipolar disorders) or suicidal ideations or attempts Gastrointestinal: Irregular, infrequent bowel movements (Constipation) Genitourinary: No reported renal or genitourinary signs or symptoms such as difficulty voiding or producing urine, peeing blood, non-functioning kidney, kidney stones, difficulty emptying the bladder, difficulty controlling the flow of urine, or chronic kidney disease Hematological: No reported hematological signs or symptoms such as prolonged bleeding, low or poor functioning platelets, bruising or bleeding easily, hereditary bleeding problems, low energy levels due to low hemoglobin or being anemic Endocrine: High blood sugar controlled without the use of insulin (NIDDM) Rheumatologic: Rheumatoid arthritis Musculoskeletal: Negative for myasthenia gravis, muscular dystrophy, multiple sclerosis or malignant hyperthermia Work History: Working full time  Allergies  [name] is allergic to tramadol.  Laboratory Chemistry Profile   Renal Lab Results  Component Value Date   BUN 24 01/22/2021   CREATININE 1.13 01/22/2021   LABCREA 171.2 07/22/2014   BCR 21 01/22/2021   GFRAA 81 01/22/2021   GFRNONAA 70 01/22/2021   PROTEINUR 100 (A) 01/09/2021     Electrolytes Lab Results  Component Value Date   NA 142 01/22/2021   K 4.5 01/22/2021   CL 104 01/22/2021   CALCIUM 10.6 (H) 01/22/2021     Hepatic Lab Results  Component Value Date   AST 37 09/14/2020   ALT 45 (H) 09/14/2020   ALBUMIN 3.6 09/14/2020   ALKPHOS 105 09/14/2020     ID Lab Results  Component Value Date   HIV NONREACTIVE 05/26/2014   SARSCOV2NAA POSITIVE (A) 01/09/2021   STAPHAUREUS NEGATIVE 04/30/2018   MRSAPCR NEGATIVE 04/30/2018     Bone No results found for: VD25OH, VD125OH2TOT, EP3295JO8, CZ6606TK1, 25OHVITD1, 25OHVITD2, 25OHVITD3, TESTOFREE, TESTOSTERONE   Endocrine Lab Results  Component Value Date   GLUCOSE 125 (H) 01/22/2021   GLUCOSEU >=500 (A) 01/09/2021   HGBA1C 6.8  (A) 09/10/2021     Neuropathy Lab Results  Component Value Date   HGBA1C 6.8 (A) 09/10/2021   HIV NONREACTIVE 05/26/2014     CNS No results found for: COLORCSF, APPEARCSF, RBCCOUNTCSF, WBCCSF, POLYSCSF, LYMPHSCSF, EOSCSF, PROTEINCSF, GLUCCSF, JCVIRUS,  CSFOLI, IGGCSF, LABACHR, ACETBL, LABACHR, ACETBL   Inflammation (CRP: Acute  ESR: Chronic) No results found for: CRP, ESRSEDRATE, LATICACIDVEN   Rheumatology Lab Results  Component Value Date   LABURIC 5.6 04/30/2013     Coagulation Lab Results  Component Value Date   INR 1.03 04/30/2018   LABPROT 13.4 04/30/2018   APTT 30 04/30/2018   PLT 282 01/09/2021     Cardiovascular Lab Results  Component Value Date   HGB 15.8 01/09/2021   HCT 42.6 01/09/2021     Screening Lab Results  Component Value Date   SARSCOV2NAA POSITIVE (A) 01/09/2021   STAPHAUREUS NEGATIVE 04/30/2018   MRSAPCR NEGATIVE 04/30/2018   HIV NONREACTIVE 05/26/2014     Cancer No results found for: CEA, CA125, LABCA2   Allergens No results found for: ALMOND, APPLE, ASPARAGUS, AVOCADO, BANANA, BARLEY, BASIL, BAYLEAF, GREENBEAN, LIMABEAN, WHITEBEAN, BEEFIGE, REDBEET, BLUEBERRY, BROCCOLI, CABBAGE, MELON, CARROT, CASEIN, CASHEWNUT, CAULIFLOWER, CELERY     Note: Lab results reviewed.  PFSH  Drug: [name]  reports no history of drug use. Alcohol:  reports current alcohol use of about 33.0 standard drinks per week. Tobacco:  reports that he quit smoking about 21 months ago. His smoking use included cigarettes. He has a 4.00 pack-year smoking history. He has quit using smokeless tobacco. Medical:  has a past medical history of Allergy, Arthritis, Chronic lower back pain, COVID-19 virus infection (01/10/2021), Diabetes mellitus without complication ([place]), GERD (gastroesophageal reflux disease), Gout, Hyperlipidemia, Hyperosmolar hyperglycemic state (HHS) ([place]) (01/10/2021), Hypertension, and Seasonal allergies. Family: family history includes Diabetes in his  mother and sister; Heart disease in his mother; Liver disease in his father.  Past Surgical History:  Procedure Laterality Date   ANKLE ARTHROSCOPY W/ OPEN REPAIR Bilateral 2008   ANKLE SURGERY     for torn ligaments at [facility] Right 05/07/2018   Procedure: RIGHT TOTAL HIP ARTHROPLASTY ANTERIOR APPROACH;  Surgeon: Leandrew Koyanagi, MD;  Location: [facility];  Service: Orthopedics;  Laterality: Right;   Active Ambulatory Problems    Diagnosis Date Noted   Gout 04/30/2013   Essential hypertension 04/30/2013   Tobacco use disorder 08/31/2013   Type 2 diabetes mellitus with proteinuria ([place]) 05/25/2014   Chronic low back pain (1ry area of Pain) (Bilateral) (L>R) w/ sciatica (Left) 09/29/2014   Need for hepatitis C screening test 07/19/2016   Alcohol use 07/19/2016   Hyperlipidemia associated with type 2 diabetes mellitus ([place]) 07/19/2016   Asthma in adult 08/08/2017   History of hip replacement 05/07/2018   Screening for colon cancer 09/08/2018   Need for immunization against influenza 09/08/2018   Onychomycosis 06/20/2020   DDD (degenerative disc disease), lumbar 05/25/2020   Lumbar radiculopathy 05/25/2020   Lumbar spondylosis 05/25/2020   AKI (acute kidney injury) ([place]) 01/09/2021   Hyperkalemia 01/09/2021   Chronic neck pain (Posterior) (Bilateral) (R>L) 03/01/2021   Chronic pain syndrome 09/28/2021   Pharmacologic therapy 09/28/2021   Disorder of skeletal system 09/28/2021   Problems influencing health status 09/28/2021   Chronic lower extremity pain (2ry area of Pain) (Bilateral) (L>R) 10/01/2021   Encounter related to worker's compensation claim 10/01/2021   Lumbosacral radiculopathy at S1 (Left) 10/01/2021   Lumbar back sprain, sequela 10/01/2021   Lumbosacral facet arthropathy (Multilevel) (Bilateral) 10/01/2021   Lumbar central spinal stenosis without neurogenic claudication 10/01/2021   Lumbosacral foraminal stenosis (Multilevel)  (Bilateral) 10/01/2021   Resolved Ambulatory Problems    Diagnosis Date Noted  Acute upper respiratory infections of unspecified site 08/31/2013   Impacted cerumen of left ear 08/31/2013   Prediabetes 03/11/2014   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   Finger injury 02/10/2015   Thumb pain, left 11/29/2016   Corneal abrasion, left 12/05/2016   Avascular necrosis of bone of right hip ([place]) 03/04/2018   Lower respiratory infection 07/22/2018   COVID-19 virus infection 01/10/2021   Hyperosmolar hyperglycemic state (HHS) ([employer]) 01/10/2021   Past Medical History:  Diagnosis Date   Allergy    Arthritis    Chronic lower back pain    Diabetes mellitus without complication (HCC)    GERD (gastroesophageal reflux disease)    Gout    Hyperlipidemia    Hypertension    Seasonal allergies    Constitutional Exam  General appearance: Well nourished, well developed, and well hydrated. In no apparent acute distress Vitals:   10/01/21 1108  BP: (!) 157/105  Pulse: 95  Resp: 16  Temp: (!) 97.2 F (36.2 C)  SpO2: 98%  Weight: 220 lb (99.8 kg)  Height: _0  (1.803 m)   BMI Assessment: Estimated body mass index is 30.68 kg/m as calculated from the following:   Height as of this encounter: _1  (1.803 m).   Weight as of this encounter: 220 lb (99.8 kg).  BMI interpretation table: BMI level Category Range association with higher incidence of chronic pain  <18 kg/m2 Underweight   18.5-24.9 kg/m2 Ideal body weight   25-29.9 kg/m2 Overweight Increased incidence by 20%  30-34.9 kg/m2 Obese (Class I) Increased incidence by 68%  35-39.9 kg/m2 Severe obesity (Class II) Increased incidence by 136%  >40 kg/m2 Extreme obesity (Class III) Increased incidence by 254%   Patient's current BMI Ideal Body weight  Body mass index is 30.68 kg/m. Ideal body weight: 75.3 kg (166 lb 0.1 oz) Adjusted ideal body weight: 85.1 kg (187 lb 9.7 oz)   BMI Readings from Last 4 Encounters:   10/01/21 30.68 kg/m  09/10/21 31.35 kg/m  03/01/21 31.06 kg/m  02/08/21 31.35 kg/m   Wt Readings from Last 4 Encounters:  10/01/21 220 lb (99.8 kg)  09/10/21 224 lb 12.8 oz (102 kg)  03/01/21 222 lb 11.2 oz (101 kg)  02/08/21 224 lb 12.8 oz (102 kg)    Psych/Mental status: Alert, oriented x 3 (person, place, & time)       Eyes: PERLA Respiratory: No evidence of acute respiratory distress  Assessment  Primary Diagnosis & Pertinent Problem List: The primary encounter diagnosis was Chronic low back pain (1ry area of Pain) (Bilateral) (L>R) w/ sciatica (Left). Diagnoses of DDD (degenerative disc disease), lumbar, Chronic lower extremity pain (2ry area of Pain) (Bilateral) (L>R), Other intervertebral disc degeneration, lumbar region, Lumbosacral radiculopathy at S1 (Left), Chronic neck pain (Posterior) (Bilateral) (R>L), Chronic pain syndrome, Pharmacologic therapy, Disorder of skeletal system, Problems influencing health status, Encounter related to worker's compensation claim, Lumbar back sprain, sequela, Lumbosacral facet arthropathy (Multilevel) (Bilateral), Lumbar central spinal stenosis without neurogenic claudication, and Lumbosacral foraminal stenosis (Multilevel) (Bilateral) were also pertinent to this visit.  Visit Diagnosis (New problems to examiner): 1. Chronic low back pain (1ry area of Pain) (Bilateral) (L>R) w/ sciatica (Left)   2. DDD (degenerative disc disease), lumbar   3. Chronic lower extremity pain (2ry area of Pain) (Bilateral) (L>R)   4. Other intervertebral disc degeneration, lumbar region   5. Lumbosacral radiculopathy at S1 (Left)   6. Chronic neck pain (Posterior) (Bilateral) (R>L)   7. Chronic pain syndrome  8. Pharmacologic therapy   9. Disorder of skeletal system   10. Problems influencing health status   11. Encounter related to worker's compensation claim   12. Lumbar back sprain, sequela   13. Lumbosacral facet arthropathy (Multilevel) (Bilateral)    14. Lumbar central spinal stenosis without neurogenic claudication   15. Lumbosacral foraminal stenosis (Multilevel) (Bilateral)    Plan of Care (Initial workup plan)  Note: [name] was reminded that as per protocol, today's visit has been an evaluation only. We have not taken over the patient's controlled substance management.  Problem-specific plan: No problem-specific Assessment & Plan notes found for this encounter. Lab Orders         Compliance Drug Analysis, Ur         Comp. Metabolic Panel (12)         Magnesium         Vitamin B12         Sedimentation rate         25-Hydroxy vitamin D Lcms D2+D3         C-reactive protein     Imaging Orders         DG Lumbar Spine Complete W/Bend         MR LUMBAR SPINE WO CONTRAST         MR THORACIC SPINE WO CONTRAST         DG Cervical Spine With Flex & Extend     Referral Orders         Ambulatory referral to Psychology     Procedure Orders    No procedure(s) ordered today   Pharmacotherapy (current): Medications ordered:  No orders of the defined types were placed in this encounter.  Medications administered during this visit: [name] had no medications administered during this visit.   Pharmacological management options:  Opioid Analgesics: The patient was informed that there is no guarantee that he would be a candidate for opioid analgesics. The decision will be made following CDC guidelines. This decision will be based on the results of diagnostic studies, as well as [name]'s risk profile.   Membrane stabilizer: To be determined at a later time  Muscle relaxant: To be determined at a later time  NSAID: To be determined at a later time  Other analgesic(s): To be determined at a later time   Interventional management options: [name] was informed that there is no guarantee that he would be a candidate for interventional therapies. The decision will be based on the results of diagnostic studies, as well as  [name]'s risk profile.  Procedure(s) under consideration:  Pending results of ordered studies     Interventional Therapies  Risk  Complexity Considerations:   Estimated body mass index is 30.68 kg/m as calculated from the following:   Height as of this encounter: _0  (1.803 m).   Weight as of this encounter: 220 lb (99.8 kg). WNL   Planned  Pending:   Pending further evaluation   Under consideration:   Lumbar spinal cord stimulator trial   Completed:   None at this time   Therapeutic  Palliative (PRN) options:   None established    Provider-requested follow-up: Return for Eval-day (M,W), (F2F), 2nd Visit, for review of ordered tests including Med-Psych SCS-eval.  No future appointments.   Note by: Gaspar Cola, MD Date: 10/01/2021; Time: 6:36 PM

## 2021-10-01 ENCOUNTER — Other Ambulatory Visit: Payer: Self-pay

## 2021-10-01 ENCOUNTER — Encounter: Payer: Self-pay | Admitting: Pain Medicine

## 2021-10-01 ENCOUNTER — Ambulatory Visit: Payer: PRIVATE HEALTH INSURANCE | Attending: Pain Medicine | Admitting: Pain Medicine

## 2021-10-01 VITALS — BP 157/105 | HR 95 | Temp 97.2°F | Resp 16 | Ht 71.0 in | Wt 220.0 lb

## 2021-10-01 DIAGNOSIS — G894 Chronic pain syndrome: Secondary | ICD-10-CM | POA: Insufficient documentation

## 2021-10-01 DIAGNOSIS — M899 Disorder of bone, unspecified: Secondary | ICD-10-CM | POA: Diagnosis present

## 2021-10-01 DIAGNOSIS — M47817 Spondylosis without myelopathy or radiculopathy, lumbosacral region: Secondary | ICD-10-CM | POA: Insufficient documentation

## 2021-10-01 DIAGNOSIS — M5442 Lumbago with sciatica, left side: Secondary | ICD-10-CM | POA: Insufficient documentation

## 2021-10-01 DIAGNOSIS — M48061 Spinal stenosis, lumbar region without neurogenic claudication: Secondary | ICD-10-CM | POA: Diagnosis present

## 2021-10-01 DIAGNOSIS — M5136 Other intervertebral disc degeneration, lumbar region: Secondary | ICD-10-CM | POA: Diagnosis not present

## 2021-10-01 DIAGNOSIS — M5417 Radiculopathy, lumbosacral region: Secondary | ICD-10-CM | POA: Insufficient documentation

## 2021-10-01 DIAGNOSIS — S335XXS Sprain of ligaments of lumbar spine, sequela: Secondary | ICD-10-CM | POA: Insufficient documentation

## 2021-10-01 DIAGNOSIS — M79604 Pain in right leg: Secondary | ICD-10-CM | POA: Diagnosis not present

## 2021-10-01 DIAGNOSIS — Z026 Encounter for examination for insurance purposes: Secondary | ICD-10-CM | POA: Diagnosis not present

## 2021-10-01 DIAGNOSIS — M4807 Spinal stenosis, lumbosacral region: Secondary | ICD-10-CM | POA: Insufficient documentation

## 2021-10-01 DIAGNOSIS — M542 Cervicalgia: Secondary | ICD-10-CM

## 2021-10-01 DIAGNOSIS — G8929 Other chronic pain: Secondary | ICD-10-CM | POA: Diagnosis present

## 2021-10-01 DIAGNOSIS — Z79899 Other long term (current) drug therapy: Secondary | ICD-10-CM | POA: Diagnosis present

## 2021-10-01 DIAGNOSIS — M79605 Pain in left leg: Secondary | ICD-10-CM | POA: Diagnosis present

## 2021-10-01 DIAGNOSIS — Z789 Other specified health status: Secondary | ICD-10-CM | POA: Diagnosis present

## 2021-10-01 NOTE — Progress Notes (Signed)
Safety precautions to be maintained throughout the outpatient stay will include: orient to surroundings, keep bed in low position, maintain call bell within reach at all times, provide assistance with transfer out of bed and ambulation.  

## 2021-10-05 LAB — COMPLIANCE DRUG ANALYSIS, UR

## 2021-10-06 LAB — COMP. METABOLIC PANEL (12)
AST: 76 IU/L — ABNORMAL HIGH (ref 0–40)
Albumin/Globulin Ratio: 1.4 (ref 1.2–2.2)
Albumin: 4.5 g/dL (ref 3.8–4.8)
Alkaline Phosphatase: 101 IU/L (ref 44–121)
BUN/Creatinine Ratio: 16 (ref 10–24)
BUN: 17 mg/dL (ref 8–27)
Bilirubin Total: 0.9 mg/dL (ref 0.0–1.2)
Calcium: 10 mg/dL (ref 8.6–10.2)
Chloride: 98 mmol/L (ref 96–106)
Creatinine, Ser: 1.07 mg/dL (ref 0.76–1.27)
Globulin, Total: 3.3 g/dL (ref 1.5–4.5)
Glucose: 139 mg/dL — ABNORMAL HIGH (ref 70–99)
Potassium: 4 mmol/L (ref 3.5–5.2)
Sodium: 136 mmol/L (ref 134–144)
Total Protein: 7.8 g/dL (ref 6.0–8.5)
eGFR: 79 mL/min/{1.73_m2} (ref >59)

## 2021-10-06 LAB — SEDIMENTATION RATE: Sed Rate: 22 mm/hr (ref 0–30)

## 2021-10-06 LAB — C-REACTIVE PROTEIN: CRP: <1 mg/L (ref 0–10)

## 2021-10-06 LAB — VITAMIN B12: Vitamin B-12: 282 pg/mL (ref 232–1245)

## 2021-10-06 LAB — MAGNESIUM: Magnesium: 1.7 mg/dL (ref 1.6–2.3)

## 2021-10-06 LAB — 25-HYDROXY VITAMIN D LCMS D2+D3
25-Hydroxy, Vitamin D-2: <1 ng/mL
25-Hydroxy, Vitamin D-3: 10 ng/mL
25-Hydroxy, Vitamin D: 10 ng/mL — ABNORMAL LOW

## 2021-12-14 ENCOUNTER — Telehealth: Payer: Self-pay | Admitting: *Deleted

## 2021-12-14 NOTE — Telephone Encounter (Signed)
Patient called in c/o 3 days of cramping, back and legs. States one of his meds causes diarrhea and he feels dehydrated. Advised pushing fluids this weekend and first available appt given for Monday, 12/19 at 0915 with [facility] as [facility] is full.

## 2021-12-17 ENCOUNTER — Encounter: Payer: Self-pay | Admitting: Internal Medicine

## 2021-12-17 ENCOUNTER — Ambulatory Visit (INDEPENDENT_AMBULATORY_CARE_PROVIDER_SITE_OTHER): Payer: BC Managed Care – PPO | Admitting: Internal Medicine

## 2021-12-17 VITALS — BP 123/76 | HR 96 | Wt 223.9 lb

## 2021-12-17 DIAGNOSIS — R252 Cramp and spasm: Secondary | ICD-10-CM | POA: Insufficient documentation

## 2021-12-17 DIAGNOSIS — I10 Essential (primary) hypertension: Secondary | ICD-10-CM | POA: Diagnosis not present

## 2021-12-17 DIAGNOSIS — K921 Melena: Secondary | ICD-10-CM

## 2021-12-17 MED ORDER — AMLODIPINE-OLMESARTAN 5-40 MG PO TABS: 1.0000 | ORAL_TABLET | Freq: Every day | ORAL | 1 refills | Status: DC

## 2021-12-17 NOTE — Assessment & Plan Note (Addendum)
BP: 123/76 today on losartan-hctz. Patient presents for evaluation of cramps. 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 Cramps usually start at end of shift. Recommended switching BP medications. No overt signs of dehydration on exam and patient not experiencing any orthostatic hypotension.   Assessment/Plan: - stop losartan-hctz - amLODipine-olmesartan (AZOR) 5-40 MG tablet; Take 1 tablet by mouth daily.  Dispense: 30 tablet; Refill: 1 - BMP8+Anion Gap

## 2021-12-17 NOTE — Patient Instructions (Signed)
Thank you for trusting me with your care. To recap, today we discussed the following:  1. Blood in stool , occasional diarrhea, cramps  - I recommend continuing to not drink alcohol which may help with diarrhea. I reviewed your recent colonoscopy and I expect bleeding is from internal hemorrhoids irritated with diarrhea. We will check blood count today, - CBC with Diff  2. Essential hypertension Stop Losartan - HCTZ. Monitor BP at home. If BP over 130/90 , call and make appointment for changes to your regimen.  - amLODipine-olmesartan (AZOR) 5-40 MG tablet; Take 1 tablet by mouth daily.  Dispense: 30 tablet; Refill: 1 - BMP8+Anion Gap

## 2021-12-17 NOTE — Progress Notes (Signed)
° °  CC: Cramps, diarrhea and blood in stool, hypertension  HPI:[name] is a 60 y.o. male who is living with alcohol use disorder, internal hemorrhoids, and hypertension who present for evaluation of cramps, diarrhea with blood in stool, and hypertension. Please see individual problem based A/P for details.   Past Medical History:  Diagnosis Date   Allergy    Arthritis    Chronic lower back pain    COVID-19 virus infection 01/10/2021   Diabetes mellitus without complication (HCC)    GERD (gastroesophageal reflux disease)    Gout    Hyperlipidemia    Hyperosmolar hyperglycemic state (HHS) ([place]) 01/10/2021   Hypertension    Seasonal allergies    Review of Systems:   Review of Systems  Constitutional:  Negative for chills and fever.  Gastrointestinal:  Positive for blood in stool and diarrhea. Negative for abdominal pain and nausea.    Physical Exam: Vitals:   12/17/21 0900  BP: 123/76  Pulse: 96  SpO2: 98%  Weight: 223 lb 14.4 oz (101.6 kg)    General: well appearing , NAD, and dressed in [place] clothes HEENT: Conjunctiva nl , antiicteric sclerae, moist mucous membranes  Cardiovascular: borderline tachycardic, regular rhythm.  No murmurs, rubs, or gallops Pulmonary : Equal breath sounds, No wheezes, rales, or rhonchi Abdominal: soft, nontender,  bowel sounds present Ext: No edema in lower extremities, no tenderness to palpation of lower extremities.   Assessment & Plan:   See Encounters Tab for problem based charting.  Patient discussed with Dr. Angelia Mould

## 2021-12-17 NOTE — Assessment & Plan Note (Signed)
Patient reports history of cramps for at least a year off and on. It has been worse the last 4 days and he asked for evaluation. He is on diuretic for HTN and we are stopping today. 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 Expect patient is becoming dehydrated. Given history of alcohol use disorder will check Mg as well. In addition will check Ferritin and CBC given history of internal hemorrhoids (seen on last colonoscopy) and bright red blood per rectum associated with diarrhea. Expect occasional diarrhea is associated with patients alcohol used. He has quit drinking for 4 days.

## 2021-12-18 ENCOUNTER — Telehealth: Payer: Self-pay | Admitting: Internal Medicine

## 2021-12-18 LAB — BMP8+ANION GAP
Anion Gap: 17 mmol/L (ref 10.0–18.0)
BUN/Creatinine Ratio: 34 — ABNORMAL HIGH (ref 10–24)
BUN: 45 mg/dL — ABNORMAL HIGH (ref 8–27)
CO2: 18 mmol/L — ABNORMAL LOW (ref 20–29)
Calcium: 10.1 mg/dL (ref 8.6–10.2)
Chloride: 100 mmol/L (ref 96–106)
Creatinine, Ser: 1.31 mg/dL — ABNORMAL HIGH (ref 0.76–1.27)
Glucose: 197 mg/dL — ABNORMAL HIGH (ref 70–99)
Potassium: 4.2 mmol/L (ref 3.5–5.2)
Sodium: 135 mmol/L (ref 134–144)
eGFR: 62 mL/min/{1.73_m2} (ref >59)

## 2021-12-18 LAB — CBC WITH DIFFERENTIAL/PLATELET
Basophils Absolute: 0.1 10*3/uL (ref 0.0–0.2)
Basos: 1 %
EOS (ABSOLUTE): 0.2 10*3/uL (ref 0.0–0.4)
Eos: 2 %
Hematocrit: 39.4 % (ref 37.5–51.0)
Hemoglobin: 13.4 g/dL (ref 13.0–17.7)
Immature Grans (Abs): 0 10*3/uL (ref 0.0–0.1)
Immature Granulocytes: 0 %
Lymphocytes Absolute: 4.2 10*3/uL — ABNORMAL HIGH (ref 0.7–3.1)
Lymphs: 45 %
MCH: 32.8 pg (ref 26.6–33.0)
MCHC: 34 g/dL (ref 31.5–35.7)
MCV: 96 fL (ref 79–97)
Monocytes Absolute: 0.6 10*3/uL (ref 0.1–0.9)
Monocytes: 7 %
Neutrophils Absolute: 4.1 10*3/uL (ref 1.4–7.0)
Neutrophils: 45 %
Platelets: 327 10*3/uL (ref 150–450)
RBC: 4.09 x10E6/uL — ABNORMAL LOW (ref 4.14–5.80)
RDW: 14.5 % (ref 11.6–15.4)
WBC: 9.2 10*3/uL (ref 3.4–10.8)

## 2021-12-18 LAB — MAGNESIUM: Magnesium: 1.4 mg/dL — ABNORMAL LOW (ref 1.6–2.3)

## 2021-12-18 LAB — FERRITIN: Ferritin: 2230 ng/mL — ABNORMAL HIGH (ref 30–400)

## 2021-12-18 MED ORDER — MAGNESIUM OXIDE 400 MG PO CAPS
400.0000 mg | ORAL_CAPSULE | Freq: Every day | ORAL | 0 refills | Status: AC
Start: 2021-12-18 — End: 2021-12-28

## 2021-12-18 NOTE — Progress Notes (Signed)
Internal Medicine Clinic Attending  Case discussed with Dr. Steen  At the time of the visit.  We reviewed the resident's history and exam and pertinent patient test results.  I agree with the assessment, diagnosis, and plan of care documented in the resident's note.  

## 2021-12-18 NOTE — Telephone Encounter (Signed)
Called and discussed lab results .Consistent with dehydration and patient has low magnesium possibly from alcohol use disorder. Patient will need follow up in 2-4 weeks for lab work including hemoglobina A1c and BMP.

## 2022-02-16 ENCOUNTER — Other Ambulatory Visit: Payer: Self-pay | Admitting: Internal Medicine

## 2022-02-16 DIAGNOSIS — I10 Essential (primary) hypertension: Secondary | ICD-10-CM

## 2022-04-01 ENCOUNTER — Encounter: Payer: Self-pay | Admitting: Pain Medicine

## 2022-04-01 ENCOUNTER — Encounter: Payer: Self-pay | Admitting: Student

## 2022-04-01 ENCOUNTER — Other Ambulatory Visit: Payer: Self-pay

## 2022-04-01 ENCOUNTER — Ambulatory Visit: Payer: BC Managed Care – PPO | Admitting: Student

## 2022-04-01 VITALS — BP 141/87 | HR 88 | Temp 98.0°F | Ht 71.0 in | Wt 219.2 lb

## 2022-04-01 DIAGNOSIS — E1129 Type 2 diabetes mellitus with other diabetic kidney complication: Secondary | ICD-10-CM | POA: Diagnosis not present

## 2022-04-01 DIAGNOSIS — R809 Proteinuria, unspecified: Secondary | ICD-10-CM

## 2022-04-01 DIAGNOSIS — E785 Hyperlipidemia, unspecified: Secondary | ICD-10-CM | POA: Diagnosis not present

## 2022-04-01 DIAGNOSIS — M5442 Lumbago with sciatica, left side: Secondary | ICD-10-CM | POA: Diagnosis not present

## 2022-04-01 DIAGNOSIS — E1169 Type 2 diabetes mellitus with other specified complication: Secondary | ICD-10-CM | POA: Diagnosis not present

## 2022-04-01 DIAGNOSIS — I10 Essential (primary) hypertension: Secondary | ICD-10-CM

## 2022-04-01 DIAGNOSIS — G8929 Other chronic pain: Secondary | ICD-10-CM

## 2022-04-01 DIAGNOSIS — E559 Vitamin D deficiency, unspecified: Secondary | ICD-10-CM | POA: Insufficient documentation

## 2022-04-01 DIAGNOSIS — Z122 Encounter for screening for malignant neoplasm of respiratory organs: Secondary | ICD-10-CM

## 2022-04-01 LAB — GLUCOSE, CAPILLARY: Glucose-Capillary: 169 mg/dL — ABNORMAL HIGH (ref 70–99)

## 2022-04-01 LAB — POCT GLYCOSYLATED HEMOGLOBIN (HGB A1C): Hemoglobin A1C: 7 % — AB (ref 4.0–5.6)

## 2022-04-01 MED ORDER — AMLODIPINE-OLMESARTAN 10-40 MG PO TABS: 1.0000 | ORAL_TABLET | Freq: Every day | ORAL | 3 refills | Status: DC

## 2022-04-01 MED ORDER — METFORMIN HCL ER 500 MG PO TB24: 1000.0000 mg | ORAL_TABLET | Freq: Two times a day (BID) | ORAL | 3 refills | Status: DC

## 2022-04-01 NOTE — Assessment & Plan Note (Addendum)
He is taking atorvastatin for primary prevention. ? ?- Recheck lipid panel > Triglyceride improves.  ?- continue atorvastatin ?

## 2022-04-01 NOTE — Patient Instructions (Addendum)
Mr. Huish, ? ?It was a pleasure seeing you in the clinic today.  Here is a summary what we talked about: ? ?1.  High blood pressure: Your blood pressure is high today.  I sent a refill of your medication Amlodipine - Olmesartan 10 - 40 mg to your pharmacy.  Please take 1 tablet daily.  Please stop taking losartan/HCTZ.  I will check your kidney function. ? ?2.  Diabetes: Your A1c is slightly higher than last time.  Please cut back on foods high in carbs such as bread [place].  Please continue your metformin.  We will recheck A1c in 3 months.  Make sure you have a follow-up appointment with your eye doctor. ? ?3.  Back pain: Please follow-up with your pain specialist for the spinal stimulator.  Please bring the FMLA form so we can fill it out for you. ? ?4.  I will also check your cholesterol. ? ?Please return in 3 months ? ?Take care,  ? ?Dr. Alfonse Spruce ?

## 2022-04-01 NOTE — Progress Notes (Signed)
Internal Medicine Clinic Attending  Case discussed with Dr. Nguyen  At the time of the visit.  We reviewed the resident's history and exam and pertinent patient test results.  I agree with the assessment, diagnosis, and plan of care documented in the resident's note. 

## 2022-04-01 NOTE — Progress Notes (Signed)
? ?  CC: Diabetes follow-up ? ?HPI: ? ?Mr.Vance Harrel is a 61 y.o. with past medical history of hypertension, type 2 diabetes, who presents to clinic today for follow-up on his diabetes and hypertension. ? ?Please see his problem based charting for detailed ? ?Past Medical History:  ?Diagnosis Date  ? Allergy   ? Arthritis   ? Chronic lower back pain   ? COVID-19 virus infection 01/10/2021  ? Diabetes mellitus without complication ([place])   ? GERD (gastroesophageal reflux disease)   ? Gout   ? Hyperlipidemia   ? Hyperosmolar hyperglycemic state (HHS) ([place]) 01/10/2021  ? Hypertension   ? Seasonal allergies   ? ?Review of Systems:  per HPI ? ?Physical Exam: ? ?Vitals:  ? 04/01/22 0903  ?BP: (!) 154/89  ?Pulse: 92  ?Temp: 98 ?F (36.7 ?C)  ?TempSrc: Oral  ?SpO2: 100%  ?Weight: 219 lb 3.2 oz (99.4 kg)  ?Height: '5\' 11"'$  (1.803 m)  ? ?Physical Exam ?Constitutional:   ?   General: He is not in acute distress. ?HENT:  ?   Head: Normocephalic.  ?Eyes:  ?   General:     ?   Right eye: No discharge.     ?   Left eye: No discharge.  ?   Conjunctiva/sclera: Conjunctivae normal.  ?Cardiovascular:  ?   Rate and Rhythm: Normal rate and regular rhythm.  ?   Pulses: Normal pulses.  ?Pulmonary:  ?   Effort: Pulmonary effort is normal. No respiratory distress.  ?   Breath sounds: Normal breath sounds.  ?Musculoskeletal:  ?   Comments: Hypotonic paraspinal muscle bilaterally.  No step-off sign.  Pain elicited in the lower back radiate down the bilateral posterior lower legs with straighten bilateral knees.  No hip instability.  ?Skin: ?   General: Skin is warm.  ?Neurological:  ?   General: No focal deficit present.  ?   Mental Status: He is alert.  ?Psychiatric:     ?   Mood and Affect: Mood normal.     ?   Behavior: Behavior normal.  ?  ? ?Assessment & Plan:  ? ?See Encounters Tab for problem based charting. ? ?Patient discussed with Dr. Philipp Ovens  ?

## 2022-04-01 NOTE — Assessment & Plan Note (Addendum)
A1c of 7, increased from 6.86 months ago.  He reports adherence to his metformin 1 g twice daily. ? ?-We talked about diet control by cutting back on carbs and sugary food. ?-Continue metformin ?-Will check microalbumin/creatinine urine ratio.  His last result was 500s.  Patient is already on an ARB. ?-He will schedule an eye exam with his optometrist ?-Recheck A1c in 3 months ? ?Addendum ?Microalbumin/creatinine ratio still elevated but stable.  We will titrate his ARB to meet blood pressure goal.  Continue to monitor microalbumin/creatinine ratio yearly.  If worsening, consider adding SGLT2 or CCB ?

## 2022-04-01 NOTE — Assessment & Plan Note (Addendum)
Blood pressure remains elevated today 154/89.  Repeat 141/80 ? ?Patient was switched to amlodipine/olmesartan after visit with Dr. Candiss Norse.  However he refilled the losartan HCTZ in February. ? ?-Will stop losartan/HCTZ ?-Switch him back to amlodipine/olmesartan at a higher dose 10-40 mg daily ?-Obtain BMP and magnesium today. ? ?Addendum ?BMP notable for mildly elevated calcium. His calcium level always in the upper limits. Denies taking calcium supplement. He is uptodate with colon cancer screening. Will order low dose CT chest to rule out lung cancer. If Calcium remains elevated at next lab check, will work up for hyperparathyroidism.  ? ?Magnesium is mildly low, likely 2/2 alcohol use. Could also be related to his hypercalcemia but less likely now. He is not on a PPI or diuretic. Advised patient to cut back on alcohol use. Will not start oral supplement at this time.  Will recheck mag at next visit.  ?

## 2022-04-01 NOTE — Assessment & Plan Note (Signed)
Chronic back pain since 2018.  MRI showed multiple level disc degenerative disease.  He was seen by a spine specialist but declined back surgery.  He currently seeing a pain specialist who thought he is a great candidate for a spinal stimulator.  Currently pending scheduled for the procedure. ? ?-Continue to follow-up with pain specialist ?-He can take Tylenol as needed for pain.  He has indomethacin but he only takes it intermittently for his gout pain. ? ?

## 2022-04-02 DIAGNOSIS — Z122 Encounter for screening for malignant neoplasm of respiratory organs: Secondary | ICD-10-CM | POA: Insufficient documentation

## 2022-04-02 LAB — BMP8+ANION GAP
Anion Gap: 16 mmol/L (ref 10.0–18.0)
BUN/Creatinine Ratio: 16 (ref 10–24)
BUN: 19 mg/dL (ref 8–27)
CO2: 20 mmol/L (ref 20–29)
Calcium: 10.4 mg/dL — ABNORMAL HIGH (ref 8.6–10.2)
Chloride: 102 mmol/L (ref 96–106)
Creatinine, Ser: 1.17 mg/dL (ref 0.76–1.27)
Glucose: 149 mg/dL — ABNORMAL HIGH (ref 70–99)
Potassium: 4.5 mmol/L (ref 3.5–5.2)
Sodium: 138 mmol/L (ref 134–144)
eGFR: 71 mL/min/{1.73_m2} (ref >59)

## 2022-04-02 LAB — MICROALBUMIN / CREATININE URINE RATIO
Creatinine, Urine: 267.6 mg/dL
Microalb/Creat Ratio: 544 mg/g creat — ABNORMAL HIGH (ref 0–29)
Microalbumin, Urine: 1456.5 ug/mL

## 2022-04-02 LAB — MAGNESIUM: Magnesium: 1.5 mg/dL — ABNORMAL LOW (ref 1.6–2.3)

## 2022-04-02 LAB — LIPID PANEL
Chol/HDL Ratio: 2 ratio (ref 0.0–5.0)
Cholesterol, Total: 168 mg/dL (ref 100–199)
HDL: 84 mg/dL (ref >39)
LDL Chol Calc (NIH): 64 mg/dL (ref 0–99)
Triglycerides: 116 mg/dL (ref 0–149)
VLDL Cholesterol Cal: 20 mg/dL (ref 5–40)

## 2022-04-02 NOTE — Assessment & Plan Note (Signed)
Patient reports past history of smoking. Quit 3 years ago. Used to smoking 1 pack a week for more than 20 years. Denies weight loss or fever. Endorses night sweat.  ? ?With his mildly elevated hypercalcemia, need to rule out lung cancer ? ?- Low dose CT chest ordered ?

## 2022-04-02 NOTE — Addendum Note (Signed)
Addended byGaylan Gerold on: 04/02/2022 11:22 AM ? ? Modules accepted: Orders ? ?

## 2022-05-04 ENCOUNTER — Other Ambulatory Visit: Payer: Self-pay | Admitting: Internal Medicine

## 2022-05-04 DIAGNOSIS — M109 Gout, unspecified: Secondary | ICD-10-CM

## 2022-05-10 ENCOUNTER — Telehealth: Payer: Self-pay

## 2022-05-10 NOTE — Telephone Encounter (Signed)
#  90 with 2 refills sent to requested pharmacy on 05/06/22. Patient notified to call [facility] for refill. ?

## 2022-05-10 NOTE — Telephone Encounter (Signed)
allopurinol (ZYLOPRIM) 100 MG tablet  ?[place] [place]  ?[address] [place], [place] [address]  ?Phone:  [phone]  Fax:  [phone]  ?

## 2022-06-12 ENCOUNTER — Ambulatory Visit (HOSPITAL_COMMUNITY): Payer: BC Managed Care – PPO

## 2022-06-17 ENCOUNTER — Ambulatory Visit (HOSPITAL_COMMUNITY): Payer: BC Managed Care – PPO

## 2022-06-24 ENCOUNTER — Ambulatory Visit (HOSPITAL_COMMUNITY)
Admission: RE | Admit: 2022-06-24 | Discharge: 2022-06-24 | Disposition: A | Discharge status: Home / Self Care | Payer: BC Managed Care – PPO | Source: Ambulatory Visit | Attending: Internal Medicine | Admitting: Internal Medicine

## 2022-06-24 DIAGNOSIS — Z122 Encounter for screening for malignant neoplasm of respiratory organs: Secondary | ICD-10-CM | POA: Diagnosis not present

## 2022-06-24 DIAGNOSIS — Z87891 Personal history of nicotine dependence: Secondary | ICD-10-CM | POA: Diagnosis not present

## 2022-06-28 ENCOUNTER — Telehealth: Payer: Self-pay | Admitting: Student

## 2022-06-28 DIAGNOSIS — K8689 Other specified diseases of pancreas: Secondary | ICD-10-CM

## 2022-06-28 DIAGNOSIS — E010 Iodine-deficiency related diffuse (endemic) goiter: Secondary | ICD-10-CM

## 2022-06-28 DIAGNOSIS — E041 Nontoxic single thyroid nodule: Secondary | ICD-10-CM

## 2022-06-28 NOTE — Telephone Encounter (Signed)
I called patient about his CT chest.  His lung imaging are benign.  There was a right-sided thyroid nodule that extend to the upper chest which we will follow-up with thyroid ultrasound.  There is also calcifications in the pancreatic head which could be calcific pancreatitis or chronic portal vein thrombosis.  I will follow-up with a pancreatic CT.  There is nonspecific caudate lobe enlargement with prominent nodes.  Cannot exclude cirrhosis.  Patient did not have any signs of volume overload on exam from last admission. Normal platelet from CBC 12/17/2021. Will reassess him at next visit with me in July. Will order RUQ [place] if clinically indicated.  Patient agrees with the plan.

## 2022-07-09 ENCOUNTER — Ambulatory Visit: Payer: BC Managed Care – PPO | Admitting: Student

## 2022-07-09 ENCOUNTER — Encounter: Payer: Self-pay | Admitting: Student

## 2022-07-09 ENCOUNTER — Other Ambulatory Visit: Payer: Self-pay

## 2022-07-09 VITALS — BP 127/74 | HR 90 | Temp 98.0°F | Ht 71.0 in | Wt 223.7 lb

## 2022-07-09 DIAGNOSIS — E1129 Type 2 diabetes mellitus with other diabetic kidney complication: Secondary | ICD-10-CM

## 2022-07-09 DIAGNOSIS — I10 Essential (primary) hypertension: Secondary | ICD-10-CM

## 2022-07-09 DIAGNOSIS — R160 Hepatomegaly, not elsewhere classified: Secondary | ICD-10-CM

## 2022-07-09 DIAGNOSIS — M1A49X0 Other secondary chronic gout, multiple sites, without tophus (tophi): Secondary | ICD-10-CM | POA: Diagnosis not present

## 2022-07-09 DIAGNOSIS — Z789 Other specified health status: Secondary | ICD-10-CM

## 2022-07-09 DIAGNOSIS — R809 Proteinuria, unspecified: Secondary | ICD-10-CM

## 2022-07-09 DIAGNOSIS — Z7984 Long term (current) use of oral hypoglycemic drugs: Secondary | ICD-10-CM

## 2022-07-09 DIAGNOSIS — R059 Cough, unspecified: Secondary | ICD-10-CM | POA: Insufficient documentation

## 2022-07-09 DIAGNOSIS — E041 Nontoxic single thyroid nodule: Secondary | ICD-10-CM

## 2022-07-09 DIAGNOSIS — N179 Acute kidney failure, unspecified: Secondary | ICD-10-CM

## 2022-07-09 DIAGNOSIS — Z87891 Personal history of nicotine dependence: Secondary | ICD-10-CM

## 2022-07-09 DIAGNOSIS — R052 Subacute cough: Secondary | ICD-10-CM

## 2022-07-09 DIAGNOSIS — F1090 Alcohol use, unspecified, uncomplicated: Secondary | ICD-10-CM

## 2022-07-09 DIAGNOSIS — Q453 Other congenital malformations of pancreas and pancreatic duct: Secondary | ICD-10-CM

## 2022-07-09 LAB — POCT GLYCOSYLATED HEMOGLOBIN (HGB A1C): Hemoglobin A1C: 6.4 % — AB (ref 4.0–5.6)

## 2022-07-09 LAB — GLUCOSE, CAPILLARY: Glucose-Capillary: 138 mg/dL — ABNORMAL HIGH (ref 70–99)

## 2022-07-09 MED ORDER — INDOMETHACIN 50 MG PO CAPS: 50.0000 mg | ORAL_CAPSULE | Freq: Two times a day (BID) | ORAL | 2 refills | Status: DC | PRN

## 2022-07-09 NOTE — Assessment & Plan Note (Addendum)
There was evidence of caudate lobe enlargement with prominent porta node.  Patient does have history of alcohol use and currently drinking 1 beer and 5 shots of liquor a day.  He does not have any evidence of cirrhosis on physical exam.  No ascites, LE edema or spider angiomata.  -Low suspicion for cirrhosis at this time but will obtain CBC and hepatic function for monitoring. -Advised patient on cutting back on alcohol.  His wife would also help him.  Addendum Platelets within normal limits. AST, ALT and alk phos mildly elevated with normal total bilirubin.  This could be secondary to alcoholic hepatitis.  We will recheck his LFT at next visit.  If continues to be elevated, consider checking hepatitis panel.

## 2022-07-09 NOTE — Assessment & Plan Note (Signed)
Patient endorses coughing with mucus production that started 1 month ago.  Also endorses watery eyes, runny nose, sneezing and postnasal drip.  He denies fever or chills.  He did not have history of allergies.  He thinks that the trigger is from work.  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  Report high heat environment with grease and vapors.  Said that he has to frequently come in and out of the freezer as well.  He thinks that his symptoms started after the work exposure.  Ear exam was unremarkable.  Normal tympanic membrane bilaterally.  There was mild erythema of nasal mucosal membrane.  Mouth exam was normal.  Suspect this is either seasonal allergy vs work exposure.  Advised patient to wear a mask at work.  He can take H2 blocker such as Allegra or Zyrtec and use nasal steroid as needed.

## 2022-07-09 NOTE — Assessment & Plan Note (Signed)
It was found to have a pancreatic head calcification on his CT chest.  Thought was due to chronic pancreatitis versus portal vein thrombosis.  Given patient have history of night sweats and hypercalcemia, a pancreatic CT was ordered for further evaluation.

## 2022-07-09 NOTE — Assessment & Plan Note (Signed)
A1c of 6.4 today.  Reports adherence to his metformin.  His wife and daughter are helping him with healthier diet.  His last microalbumin/creatinine urine was elevated 544.  He is already on an ARB with good blood pressure control.  We will not add SGLT2 at this time.  -Continue metformin 500 mg daily -Patient will call his eye doctor for an appointment.

## 2022-07-09 NOTE — Assessment & Plan Note (Signed)
Found CT chest that he has a right thyroid nodule extending to the upper chest.  -Pending thyroid ultrasound -Obtain TSH  Addendum: TSH within normal limits

## 2022-07-09 NOTE — Progress Notes (Signed)
CC: Follow-up on A1c  HPI:  [name] is a 61 y.o. with past medical history of hypertension, type 2 diabetes, hyperlipidemia, who presents to the clinic for A1c follow-up.  Please see problem based charting for detail  Past Medical History:  Diagnosis Date   Allergy    Arthritis    Chronic lower back pain    COVID-19 virus infection 01/10/2021   Diabetes mellitus without complication (HCC)    GERD (gastroesophageal reflux disease)    Gout    Hyperlipidemia    Hyperosmolar hyperglycemic state (HHS) ([place]) 01/10/2021   Hypertension    Seasonal allergies    Review of Systems:  per HPI  Physical Exam:  Vitals:   07/09/22 0907  BP: 127/74  Pulse: 90  Temp: 98 F (36.7 C)  TempSrc: Oral  SpO2: 100%  Weight: 223 lb 11.2 oz (101.5 kg)  Height: '5\' 11"'$  (1.803 m)   Physical Exam HENT:     Head: Normocephalic.     Right Ear: Tympanic membrane, ear canal and external ear normal. There is no impacted cerumen.     Left Ear: Tympanic membrane, ear canal and external ear normal. There is no impacted cerumen.     Nose:     Comments: Mild erythematous mucosal membrane    Mouth/Throat:     Mouth: Mucous membranes are moist.     Pharynx: Oropharynx is clear. No oropharyngeal exudate or posterior oropharyngeal erythema.  Eyes:     General:        Right eye: No discharge.        Left eye: No discharge.     Conjunctiva/sclera: Conjunctivae normal.  Cardiovascular:     Rate and Rhythm: Normal rate and regular rhythm.     Comments: No LE edema Pulmonary:     Effort: Pulmonary effort is normal. No respiratory distress.     Breath sounds: Normal breath sounds. No wheezing.     Comments: Lower lungs are clear.  Mainly upper airway sound heard  Abdominal:     General: Bowel sounds are normal. There is no distension.     Palpations: Abdomen is soft. There is no mass.     Tenderness: There is no abdominal tenderness.     Comments: No hepatomegaly  Musculoskeletal:         General: Normal range of motion.     Cervical back: Normal range of motion.  Skin:    General: Skin is warm.  Neurological:     General: No focal deficit present.     Mental Status: He is alert and oriented to person, place, and time.  Psychiatric:        Mood and Affect: Mood normal.        Behavior: Behavior normal.      Assessment & Plan:   See Encounters Tab for problem based charting.  Essential hypertension Blood pressure well controlled at 127/74.  He reports adherence to his medication.  Last BMP 3 months ago was unremarkable.  -Continue amlodipine-olmesartan 10-40-40 mg daily  Type 2 diabetes mellitus with proteinuria (HCC) A1c of 6.4 today.  Reports adherence to his metformin.  His wife and daughter are helping him with healthier diet.  His last microalbumin/creatinine urine was elevated 544.  He is already on an ARB with good blood pressure control.  We will not add SGLT2 at this time.  -Continue metformin 500 mg daily -Patient will call his eye doctor for an appointment.  Alcohol use There was  evidence of caudate lobe enlargement with prominent porta node.  Patient does have history of alcohol use and currently drinking 1 beer and 5 shots of liquor a day.  He does not have any evidence of cirrhosis on physical exam.  No ascites, LE edema or spider angiomata.  -Low suspicion for cirrhosis at this time but will obtain CBC and hepatic function for monitoring. -Advised patient on cutting back on alcohol.  His wife would also help him.  Cough Patient endorses coughing with mucus production that started 1 month ago.  Also endorses watery eyes, runny nose, sneezing and postnasal drip.  He denies fever or chills.  He did not have history of allergies.  He thinks that the trigger is from work.  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  Report high heat environment with grease and vapors.  Said that he has to frequently come in and out of the freezer as well.  He thinks  that his symptoms started after the work exposure.  Ear exam was unremarkable.  Normal tympanic membrane bilaterally.  There was mild erythema of nasal mucosal membrane.  Mouth exam was normal.  Suspect this is either seasonal allergy vs work exposure.  Advised patient to wear a mask at work.  He can take H2 blocker such as Allegra or Zyrtec and use nasal steroid as needed.  Thyroid nodule Found CT chest that he has a right thyroid nodule extending to the upper chest.  -Pending thyroid ultrasound -Obtain TSH  Pancreatic abnormality It was found to have a pancreatic head calcification on his CT chest.  Thought was due to chronic pancreatitis versus portal vein thrombosis.  Given patient have history of night sweats and hypercalcemia, a pancreatic CT was ordered for further evaluation.   Patient discussed with Dr. Heber [place]

## 2022-07-09 NOTE — Assessment & Plan Note (Addendum)
Blood pressure well controlled at 127/74.  He reports adherence to his medication.  Last BMP 3 months ago was unremarkable.  -Continue amlodipine-olmesartan 10-40 mg daily  Addendum  Repeat CMP reviewed AKI with creatinine 1.6, GFR 48 and potassium 5.4.  Suspect prerenal AKI.  Advised patient to hold his blood pressure medication and stay well-hydrated.  He will return in 4 days for repeat BMP.  Advised patient to check his blood pressure at home and notify us if systolic blood pressure > 160 - 170.   Addendum Creatinine improved to 1.4 with GFR 56.  His metabolic acidosis however worsened to 17 with AG of 18.  Patient denies any GI losses including emesis or diarrhea.  One possible differential is lactic acidosis from metformin, however patient was on metformin for a very long time.  Advised patient to continue holding his blood pressure medications and stay well-hydrated.  He states that his blood pressures are within good range with systolic in the 409 - 811.  Patient will return in the next few days for repeat BMP.  I will also check lactic acid at that time.  Patient verbalizes agreement.  Addendum Creatinine returns to normal.  He still has anion gap metabolic acidosis.  We ruled out lactic acidosis.  I suspect this is ketoacidosis in the setting of chronic alcohol use.  Patient is advised to resume his blood pressure medication and encouraged on alcohol cessation.  We will repeat BMP at next visit.  Patient agrees with plan.

## 2022-07-09 NOTE — Patient Instructions (Signed)
[name],  It was nice seeing you in the clinic today.  I am glad that you are doing well.  Here is a summary what we talked about:  1.  Your blood pressure is very well controlled.  Please continue amlodipine-olmesartan.  2.  Your A1c is 6.4 which means your diabetes is very well controlled.  You are doing a great job.  Please continue metformin.  Please schedule an appointment with your eye doctor for yearly exam.  3.  I will follow-up on the result of your thyroid ultrasound.  I will also check your thyroid function today.  4.  I will also follow-up on the result of the pancreas scan.  5.  Please cut back on alcohol use as much as you can.  Right now you do not have any signs or symptoms of cirrhosis.  I will check blood work for your liver function.  6.  Cough and mucus: You can take allergy medication such as Allegra or Zyrtec.  You can also use nasal steroid spray as needed as well.  Please return in 3 months  Take care,  Dr. Alfonse Spruce

## 2022-07-10 LAB — CMP14 + ANION GAP
ALT: 49 IU/L — ABNORMAL HIGH (ref 0–44)
AST: 52 IU/L — ABNORMAL HIGH (ref 0–40)
Albumin/Globulin Ratio: 1.9 (ref 1.2–2.2)
Albumin: 5 g/dL — ABNORMAL HIGH (ref 3.9–4.9)
Alkaline Phosphatase: 147 IU/L — ABNORMAL HIGH (ref 44–121)
Anion Gap: 15 mmol/L (ref 10.0–18.0)
BUN/Creatinine Ratio: 17 (ref 10–24)
BUN: 28 mg/dL — ABNORMAL HIGH (ref 8–27)
Bilirubin Total: <0.2 mg/dL (ref 0.0–1.2)
CO2: 19 mmol/L — ABNORMAL LOW (ref 20–29)
Calcium: 9.9 mg/dL (ref 8.6–10.2)
Chloride: 105 mmol/L (ref 96–106)
Creatinine, Ser: 1.61 mg/dL — ABNORMAL HIGH (ref 0.76–1.27)
Globulin, Total: 2.7 g/dL (ref 1.5–4.5)
Glucose: 139 mg/dL — ABNORMAL HIGH (ref 70–99)
Potassium: 5.4 mmol/L — ABNORMAL HIGH (ref 3.5–5.2)
Sodium: 139 mmol/L (ref 134–144)
Total Protein: 7.7 g/dL (ref 6.0–8.5)
eGFR: 48 mL/min/{1.73_m2} — ABNORMAL LOW (ref >59)

## 2022-07-10 LAB — CBC
Hematocrit: 36.1 % — ABNORMAL LOW (ref 37.5–51.0)
Hemoglobin: 12.5 g/dL — ABNORMAL LOW (ref 13.0–17.7)
MCH: 32.6 pg (ref 26.6–33.0)
MCHC: 34.6 g/dL (ref 31.5–35.7)
MCV: 94 fL (ref 79–97)
Platelets: 319 10*3/uL (ref 150–450)
RBC: 3.84 x10E6/uL — ABNORMAL LOW (ref 4.14–5.80)
RDW: 12.8 % (ref 11.6–15.4)
WBC: 5.5 10*3/uL (ref 3.4–10.8)

## 2022-07-10 LAB — TSH: TSH: 2.32 u[IU]/mL (ref 0.450–4.500)

## 2022-07-10 NOTE — Addendum Note (Signed)
Addended byGaylan Gerold on: 07/10/2022 05:39 PM   Modules accepted: Orders

## 2022-07-11 NOTE — Progress Notes (Signed)
Internal Medicine Clinic Attending  Case discussed with the resident at the time of the visit.  We reviewed the resident's history and exam and pertinent patient test results.  I agree with the assessment, diagnosis, and plan of care documented in the resident's note.  

## 2022-07-15 ENCOUNTER — Other Ambulatory Visit (INDEPENDENT_AMBULATORY_CARE_PROVIDER_SITE_OTHER): Payer: BC Managed Care – PPO

## 2022-07-15 DIAGNOSIS — I10 Essential (primary) hypertension: Secondary | ICD-10-CM

## 2022-07-15 DIAGNOSIS — M25562 Pain in left knee: Secondary | ICD-10-CM | POA: Diagnosis not present

## 2022-07-16 LAB — BMP8+ANION GAP
Anion Gap: 18 mmol/L (ref 10.0–18.0)
BUN/Creatinine Ratio: 18 (ref 10–24)
BUN: 25 mg/dL (ref 8–27)
CO2: 17 mmol/L — ABNORMAL LOW (ref 20–29)
Calcium: 10 mg/dL (ref 8.6–10.2)
Chloride: 105 mmol/L (ref 96–106)
Creatinine, Ser: 1.42 mg/dL — ABNORMAL HIGH (ref 0.76–1.27)
Glucose: 166 mg/dL — ABNORMAL HIGH (ref 70–99)
Potassium: 4.3 mmol/L (ref 3.5–5.2)
Sodium: 140 mmol/L (ref 134–144)
eGFR: 56 mL/min/{1.73_m2} — ABNORMAL LOW (ref >59)

## 2022-07-17 NOTE — Addendum Note (Signed)
Addended byGaylan Gerold on: 07/17/2022 08:43 AM   Modules accepted: Orders

## 2022-07-18 ENCOUNTER — Ambulatory Visit (HOSPITAL_COMMUNITY): Payer: BC Managed Care – PPO

## 2022-07-22 ENCOUNTER — Other Ambulatory Visit (INDEPENDENT_AMBULATORY_CARE_PROVIDER_SITE_OTHER): Payer: BC Managed Care – PPO

## 2022-07-22 DIAGNOSIS — N179 Acute kidney failure, unspecified: Secondary | ICD-10-CM

## 2022-07-22 LAB — LACTIC ACID, PLASMA: Lactic Acid, Venous: 1.5 mmol/L (ref 0.5–1.9)

## 2022-07-23 LAB — BMP8+ANION GAP
Anion Gap: 15 mmol/L (ref 10.0–18.0)
BUN/Creatinine Ratio: 21 (ref 10–24)
BUN: 24 mg/dL (ref 8–27)
CO2: 17 mmol/L — ABNORMAL LOW (ref 20–29)
Calcium: 10.1 mg/dL (ref 8.6–10.2)
Chloride: 103 mmol/L (ref 96–106)
Creatinine, Ser: 1.16 mg/dL (ref 0.76–1.27)
Glucose: 228 mg/dL — ABNORMAL HIGH (ref 70–99)
Potassium: 4.2 mmol/L (ref 3.5–5.2)
Sodium: 135 mmol/L (ref 134–144)
eGFR: 72 mL/min/{1.73_m2} (ref >59)

## 2022-07-28 ENCOUNTER — Other Ambulatory Visit: Payer: Self-pay | Admitting: Internal Medicine

## 2022-07-28 DIAGNOSIS — M1A49X0 Other secondary chronic gout, multiple sites, without tophus (tophi): Secondary | ICD-10-CM

## 2022-07-30 ENCOUNTER — Telehealth: Payer: Self-pay | Admitting: Student

## 2022-07-30 NOTE — Telephone Encounter (Signed)
MED REFILL REQUEST   indomethacin (INDOCIN) 50 MG capsule    [facility] - [place], [place] - [address] Phone: [phone]  Fax: [phone]     

## 2022-07-31 NOTE — Telephone Encounter (Signed)
RTC to patient was unable to pick up refill on Indomethacin .  Call to the [facility] spoke to Pharmacist patient has a refill.  Unsure as to why patiwnt was unable to pick up.  Insurance will cover.  Patient was called and informed that prescription is being filled for the Indomethacin.

## 2022-09-03 ENCOUNTER — Other Ambulatory Visit: Payer: Self-pay | Admitting: Internal Medicine

## 2022-09-03 DIAGNOSIS — I10 Essential (primary) hypertension: Secondary | ICD-10-CM

## 2022-09-05 ENCOUNTER — Other Ambulatory Visit: Payer: Self-pay | Admitting: Internal Medicine

## 2022-09-05 DIAGNOSIS — I10 Essential (primary) hypertension: Secondary | ICD-10-CM

## 2022-09-06 ENCOUNTER — Telehealth: Payer: Self-pay | Admitting: *Deleted

## 2022-09-06 NOTE — Telephone Encounter (Signed)
Call from stating he's unable to get a refill on his BP pill,Losartan. Inform pt this med was discontinued 11/2021 and changed to Amlodipine-Olmesartan 10/40 mg . States he did not realized his med had been changed. And he will call the pharmacy.

## 2022-09-12 ENCOUNTER — Other Ambulatory Visit: Payer: Self-pay | Admitting: Student

## 2022-09-12 DIAGNOSIS — I10 Essential (primary) hypertension: Secondary | ICD-10-CM

## 2022-10-15 ENCOUNTER — Other Ambulatory Visit: Payer: Self-pay | Admitting: Internal Medicine

## 2022-10-15 DIAGNOSIS — I10 Essential (primary) hypertension: Secondary | ICD-10-CM

## 2022-10-15 DIAGNOSIS — E1129 Type 2 diabetes mellitus with other diabetic kidney complication: Secondary | ICD-10-CM

## 2022-10-21 ENCOUNTER — Other Ambulatory Visit: Payer: Self-pay

## 2022-10-21 DIAGNOSIS — I10 Essential (primary) hypertension: Secondary | ICD-10-CM

## 2022-11-30 ENCOUNTER — Other Ambulatory Visit: Payer: Self-pay | Admitting: Student

## 2022-11-30 DIAGNOSIS — E1129 Type 2 diabetes mellitus with other diabetic kidney complication: Secondary | ICD-10-CM

## 2022-11-30 DIAGNOSIS — I10 Essential (primary) hypertension: Secondary | ICD-10-CM

## 2022-12-12 ENCOUNTER — Other Ambulatory Visit: Payer: Self-pay | Admitting: Student

## 2022-12-12 DIAGNOSIS — I10 Essential (primary) hypertension: Secondary | ICD-10-CM

## 2023-01-21 ENCOUNTER — Other Ambulatory Visit: Payer: Self-pay

## 2023-01-21 DIAGNOSIS — M1A49X0 Other secondary chronic gout, multiple sites, without tophus (tophi): Secondary | ICD-10-CM

## 2023-01-21 MED ORDER — INDOMETHACIN 50 MG PO CAPS: 50.0000 mg | ORAL_CAPSULE | Freq: Two times a day (BID) | ORAL | 0 refills | Status: DC | PRN

## 2023-02-10 ENCOUNTER — Ambulatory Visit: Payer: BC Managed Care – PPO | Admitting: Student

## 2023-02-10 ENCOUNTER — Other Ambulatory Visit: Payer: Self-pay

## 2023-02-10 ENCOUNTER — Encounter: Payer: Self-pay | Admitting: Student

## 2023-02-10 VITALS — BP 144/87 | HR 106 | Temp 98.0°F | Ht 71.0 in | Wt 221.3 lb

## 2023-02-10 DIAGNOSIS — Z789 Other specified health status: Secondary | ICD-10-CM

## 2023-02-10 DIAGNOSIS — E1129 Type 2 diabetes mellitus with other diabetic kidney complication: Secondary | ICD-10-CM

## 2023-02-10 DIAGNOSIS — K0889 Other specified disorders of teeth and supporting structures: Secondary | ICD-10-CM | POA: Diagnosis not present

## 2023-02-10 DIAGNOSIS — E8729 Other acidosis: Secondary | ICD-10-CM

## 2023-02-10 DIAGNOSIS — M5136 Other intervertebral disc degeneration, lumbar region: Secondary | ICD-10-CM | POA: Diagnosis not present

## 2023-02-10 DIAGNOSIS — I10 Essential (primary) hypertension: Secondary | ICD-10-CM | POA: Diagnosis not present

## 2023-02-10 DIAGNOSIS — Z87891 Personal history of nicotine dependence: Secondary | ICD-10-CM

## 2023-02-10 DIAGNOSIS — Z7984 Long term (current) use of oral hypoglycemic drugs: Secondary | ICD-10-CM

## 2023-02-10 DIAGNOSIS — R809 Proteinuria, unspecified: Secondary | ICD-10-CM | POA: Diagnosis not present

## 2023-02-10 DIAGNOSIS — F1090 Alcohol use, unspecified, uncomplicated: Secondary | ICD-10-CM

## 2023-02-10 LAB — POCT GLYCOSYLATED HEMOGLOBIN (HGB A1C): Hemoglobin A1C: 7.5 % — AB (ref 4.0–5.6)

## 2023-02-10 LAB — GLUCOSE, CAPILLARY: Glucose-Capillary: 184 mg/dL — ABNORMAL HIGH (ref 70–99)

## 2023-02-10 MED ORDER — METFORMIN HCL 1000 MG PO TABS: 1000.0000 mg | ORAL_TABLET | Freq: Two times a day (BID) | ORAL | 2 refills | Status: DC

## 2023-02-10 MED ORDER — DULOXETINE HCL 30 MG PO CPEP: 30.0000 mg | ORAL_CAPSULE | Freq: Every day | ORAL | 2 refills | Status: DC

## 2023-02-10 NOTE — Assessment & Plan Note (Addendum)
His wife states that alcohol use is becoming a concerning problem.  He used to drink intermittently but has been drinking daily, 3-4 shots of liquor with a couple beers consistently.  He denies any prior history of alcohol withdrawal or seizure.  No trouble with the law from his alcohol use.    Patient states that he is using alcohol to treat his chronic pain.  His chronic pain is addressed separately.  His PHQ-9 is 3 which is low risk depression but the questionnaire was done in front of his wife so I question its accuracy.  He states that he would have hand tremors if he stops drinking.  His last alcoholic drink was last night and right now he has mild tremors of outstretched hands. I believe he has a mild withdrawal with his hand tremors tachycardia and hypertension.  We discussed different options to help with alcohol withdrawal including slow taper of his alcohol vs Librium.  Patient does not want to add more medication to his list and decided to go with slow taper.  I also offered behavioral health services for his AUD but he declines.  Will also add naltrexone if his LFT is normal.   -Follow-up in 2 to 3 weeks for alcohol taper progress

## 2023-02-10 NOTE — Progress Notes (Signed)
CC: 63-monthfollow-up for A1c  HPI:  [name] is a 62y.o. living with hypertension, type 2 diabetes, hyperlipidemia, chronic back pain who presents to the clinic for A1c recheck.  Please see problem based charting for detail  Past Medical History:  Diagnosis Date   Allergy    Arthritis    Chronic lower back pain    COVID-19 virus infection 01/10/2021   Diabetes mellitus without complication (HCC)    GERD (gastroesophageal reflux disease)    Gout    Hyperlipidemia    Hyperosmolar hyperglycemic state (HHS) ([place] 01/10/2021   Hypertension    Seasonal allergies    Review of Systems:  per HPI  Physical Exam:  Vitals:   02/10/23 1603 02/10/23 1617 02/10/23 1650  BP: (!) 145/86 (!) 145/91 (!) 144/87  Pulse: (!) 118 (!) 109 (!) 106  Temp: 98 F (36.7 C)    TempSrc: Oral    SpO2: 99%    Weight: 221 lb 4.8 oz (100.4 kg)    Height: 5' 11"$  (1.803 m)     Physical Exam Constitutional:      General: [name] is not in acute distress. HENT:     Head: Normocephalic.  Eyes:     General:        Right eye: No discharge.        Left eye: No discharge.     Conjunctiva/sclera: Conjunctivae normal.  Cardiovascular:     Rate and Rhythm: Regular rhythm. Tachycardia present.  Pulmonary:     Effort: Pulmonary effort is normal. No respiratory distress.     Breath sounds: Normal breath sounds. No wheezing.  Abdominal:     General: Bowel sounds are normal. There is no distension.     Palpations: Abdomen is soft.  Musculoskeletal:        General: Normal range of motion.     Cervical back: Normal range of motion.     Comments: Mild tremors of outstretched hands  Skin:    General: Skin is warm.  Neurological:     Mental Status: [name] is alert. Mental status is at baseline.  Psychiatric:        Mood and Affect: Mood normal.      Assessment & Plan:   See Encounters Tab for problem based charting.  Essential hypertension Blood pressures elevated with systolic in the 10000000  [name] is  also tachycardic as well.  I believe [name] is in the mild alcohol withdrawal state.  Will not adjust his medication today.  Recheck blood pressure in 2 weeks.  -Continue amlodipine-olmesartan 10 - 40 mg daily  Type 2 diabetes mellitus with proteinuria (HCC) A1c elevated to 7.5 from 6.4.  Patient is taking metformin 1000 mg once a day instead of twice daily due to the amount of pills [name] has to take.   -Change metformin to 1000 mg tablet, twice daily dosing.  His A1c was well-controlled on this regimen previously -Will hold off on adding SGLT2 given patient is very concerned about polypharmacy -Referral to ophthalmology  DDD (degenerative disc disease), lumbar History of chronic back pain with degenerative disc disease and narrowed spinal canal.  [name] was seen by pain specialist and had a pain stimulator placed which [name] does not use very often.  [name] has good days and bad days.  [name] will take 1 tablet indomethacin on his bad day for his chronic knee pain.  Patient is working at [employer] [name] does heavy lifting which also exacerbates his pain.  Today  patient report feeling well without any pain or discomfort. Unfortunately patient is using alcohol to treat his pain.  His alcohol use disorder is addressed separately.  -Referral to orthopedic for follow-up.  [name] is amenable for steroid injection if they offer -Started Cymbalta 30 mg daily for his chronic back pain -Advised patient to use indomethacin as needed for pain.  [name] can take more than 1 tablet if needed -Patient has had physical therapy in the past which [name] found helpful.  Alcohol use disorder His wife states that alcohol use is becoming a concerning problem.  [name] used to drink intermittently but has been drinking daily, 3-4 shots of liquor with a couple beers consistently.  [name] denies any prior history of alcohol withdrawal or seizure.  No trouble with the law from his alcohol use.    Patient states that [name] is using alcohol to treat his chronic  pain.  His chronic pain is addressed separately.  His PHQ-9 is 3 which is low risk depression but the questionnaire was done in front of his wife so I question its accuracy.  [name] states that [name] would have hand tremors if [name] stops drinking.  His last alcoholic drink was last night and right now [name] has mild tremors of outstretched hands. I believe [name] has a mild withdrawal with his hand tremors tachycardia and hypertension.  We discussed different options to help with alcohol withdrawal including slow taper of his alcohol vs Librium.  Patient does not want to add more medication to his list and decided to go with slow taper.  I also offered behavioral health services for his AUD but [name] declines.  Will also add naltrexone if his LFT is normal.   -Follow-up in 2 to 3 weeks for alcohol taper progress    Patient discussed with Dr. Heber [place]

## 2023-02-10 NOTE — Assessment & Plan Note (Addendum)
History of chronic back pain with degenerative disc disease and narrowed spinal canal.  He was seen by pain specialist and had a pain stimulator placed which he does not use very often.  He has good days and bad days.  He will take 1 tablet indomethacin on his bad day for his chronic knee pain.  Patient is working at [employer] which he does heavy lifting which also exacerbates his pain.  Today patient report feeling well without any pain or discomfort. Unfortunately patient is using alcohol to treat his pain.  His alcohol use disorder is addressed separately.  -Referral to orthopedic for follow-up.  He is amenable for steroid injection if they offer -Started Cymbalta 30 mg daily for his chronic back pain -Advised patient to use indomethacin as needed for pain.  He can take more than 1 tablet if needed -Patient has had physical therapy in the past which he found helpful.

## 2023-02-10 NOTE — Patient Instructions (Signed)
Mr. Haniff,   It was nice seeing you in the clinic today.  Here is a summary what we talked about:  1.  Chronic back pain and knee pain: I placed a referral to the orthopedic who can evaluate and consider steroid injection if needed.  I started you on low-dose of Cymbalta 30 mg daily to help with your chronic back pain as well.  2.  Please cut back on your alcohol consumption slowly.  Please do not stop cold [place] because this can cause alcohol withdrawal or seizure.  If your liver function is normal, I will send in a prescription of naltrexone.  3.  I change your metformin to 1000 mg twice daily.   4.  I placed a referral to an ophthalmologist and a dentist as well  Please return in 2-3 weeks.  Please make an appointment with me  Dr. Alfonse Spruce

## 2023-02-10 NOTE — Assessment & Plan Note (Signed)
A1c elevated to 7.5 from 6.4.  Patient is taking metformin 1000 mg once a day instead of twice daily due to the amount of pills he has to take.   -Change metformin to 1000 mg tablet, twice daily dosing.  His A1c was well-controlled on this regimen previously -Will hold off on adding SGLT2 given patient is very concerned about polypharmacy -Referral to ophthalmology

## 2023-02-10 NOTE — Assessment & Plan Note (Addendum)
Blood pressures elevated with systolic in the 0000000.  He is also tachycardic as well.  I believe he is in the mild alcohol withdrawal state.  Will not adjust his medication today.  Recheck blood pressure in 2 weeks.  -Continue amlodipine-olmesartan 10 - 40 mg daily

## 2023-02-12 LAB — CMP14 + ANION GAP
ALT: 55 IU/L — ABNORMAL HIGH (ref 0–44)
AST: 144 IU/L — ABNORMAL HIGH (ref 0–40)
Albumin/Globulin Ratio: 1.4 (ref 1.2–2.2)
Albumin: 4.6 g/dL (ref 3.9–4.9)
Alkaline Phosphatase: 158 IU/L — ABNORMAL HIGH (ref 44–121)
Anion Gap: 22 mmol/L — ABNORMAL HIGH (ref 10.0–18.0)
BUN/Creatinine Ratio: 19 (ref 10–24)
BUN: 22 mg/dL (ref 8–27)
Bilirubin Total: 0.2 mg/dL (ref 0.0–1.2)
CO2: 14 mmol/L — ABNORMAL LOW (ref 20–29)
Calcium: 9.5 mg/dL (ref 8.6–10.2)
Chloride: 100 mmol/L (ref 96–106)
Creatinine, Ser: 1.17 mg/dL (ref 0.76–1.27)
Globulin, Total: 3.4 g/dL (ref 1.5–4.5)
Glucose: 169 mg/dL — ABNORMAL HIGH (ref 70–99)
Potassium: 4.5 mmol/L (ref 3.5–5.2)
Sodium: 136 mmol/L (ref 134–144)
Total Protein: 8 g/dL (ref 6.0–8.5)
eGFR: 70 mL/min/{1.73_m2} (ref >59)

## 2023-02-12 NOTE — Addendum Note (Signed)
Addended byGaylan Gerold on: 02/12/2023 03:37 PM   Modules accepted: Orders

## 2023-02-13 NOTE — Progress Notes (Signed)
Internal Medicine Clinic Attending  Case discussed with the resident at the time of the visit.  We reviewed the resident's history and exam and pertinent patient test results.  I agree with the assessment, diagnosis, and plan of care documented in the resident's note.  

## 2023-02-18 ENCOUNTER — Other Ambulatory Visit (INDEPENDENT_AMBULATORY_CARE_PROVIDER_SITE_OTHER): Payer: BC Managed Care – PPO

## 2023-02-18 DIAGNOSIS — E8729 Other acidosis: Secondary | ICD-10-CM | POA: Diagnosis not present

## 2023-02-18 LAB — COMPREHENSIVE METABOLIC PANEL
ALT: 47 U/L — ABNORMAL HIGH (ref 0–44)
AST: 37 U/L (ref 15–41)
Albumin: 3.9 g/dL (ref 3.5–5.0)
Alkaline Phosphatase: 120 U/L (ref 38–126)
Anion gap: 9 (ref 5–15)
BUN: 20 mg/dL (ref 8–23)
CO2: 22 mmol/L (ref 22–32)
Calcium: 9.8 mg/dL (ref 8.9–10.3)
Chloride: 106 mmol/L (ref 98–111)
Creatinine, Ser: 1.12 mg/dL (ref 0.61–1.24)
GFR, Estimated: >60 mL/min (ref >60)
Glucose, Bld: 158 mg/dL — ABNORMAL HIGH (ref 70–99)
Potassium: 4.4 mmol/L (ref 3.5–5.1)
Sodium: 137 mmol/L (ref 135–145)
Total Bilirubin: 0.6 mg/dL (ref 0.3–1.2)
Total Protein: 7.4 g/dL (ref 6.5–8.1)

## 2023-02-18 LAB — URINALYSIS, ROUTINE W REFLEX MICROSCOPIC
Bilirubin Urine: NEGATIVE
Glucose, UA: 100 mg/dL — AB
Ketones, ur: NEGATIVE mg/dL
Leukocytes,Ua: NEGATIVE
Nitrite: NEGATIVE
Protein, ur: 100 mg/dL — AB
Specific Gravity, Urine: 1.025 (ref 1.005–1.030)
pH: 5.5 (ref 5.0–8.0)

## 2023-02-18 LAB — LACTIC ACID, PLASMA: Lactic Acid, Venous: 1.6 mmol/L (ref 0.5–1.9)

## 2023-02-18 LAB — URINALYSIS, MICROSCOPIC (REFLEX)

## 2023-02-18 NOTE — Addendum Note (Signed)
Addended by: Truddie Crumble on: 02/18/2023 09:32 AM   Modules accepted: Orders

## 2023-02-27 ENCOUNTER — Other Ambulatory Visit: Payer: Self-pay

## 2023-02-27 DIAGNOSIS — M109 Gout, unspecified: Secondary | ICD-10-CM

## 2023-02-27 MED ORDER — ALLOPURINOL 100 MG PO TABS: 100.0000 mg | ORAL_TABLET | Freq: Every day | ORAL | 2 refills | Status: DC

## 2023-02-28 ENCOUNTER — Other Ambulatory Visit: Payer: Self-pay | Admitting: Student

## 2023-02-28 DIAGNOSIS — M1A49X0 Other secondary chronic gout, multiple sites, without tophus (tophi): Secondary | ICD-10-CM

## 2023-02-28 MED ORDER — INDOMETHACIN 50 MG PO CAPS: 50.0000 mg | ORAL_CAPSULE | Freq: Two times a day (BID) | ORAL | 0 refills | Status: DC | PRN

## 2023-02-28 NOTE — Telephone Encounter (Signed)
MED REFILL REQUEST   indomethacin (INDOCIN) 50 MG capsule    [facility] [place] Phone: [phone]  Fax: [phone]

## 2023-03-04 ENCOUNTER — Encounter: Payer: Self-pay | Admitting: Student

## 2023-03-04 ENCOUNTER — Ambulatory Visit (HOSPITAL_COMMUNITY)
Admission: RE | Admit: 2023-03-04 | Discharge: 2023-03-04 | Disposition: A | Discharge status: Home / Self Care | Payer: BC Managed Care – PPO | Source: Ambulatory Visit | Attending: Internal Medicine | Admitting: Internal Medicine

## 2023-03-04 ENCOUNTER — Ambulatory Visit: Payer: BC Managed Care – PPO | Admitting: Student

## 2023-03-04 ENCOUNTER — Other Ambulatory Visit: Payer: Self-pay

## 2023-03-04 VITALS — BP 132/69 | HR 96 | Temp 98.6°F | Ht 71.0 in | Wt 225.1 lb

## 2023-03-04 DIAGNOSIS — I10 Essential (primary) hypertension: Secondary | ICD-10-CM

## 2023-03-04 DIAGNOSIS — F1090 Alcohol use, unspecified, uncomplicated: Secondary | ICD-10-CM | POA: Diagnosis not present

## 2023-03-04 DIAGNOSIS — R0789 Other chest pain: Secondary | ICD-10-CM | POA: Diagnosis not present

## 2023-03-04 NOTE — Patient Instructions (Addendum)
[name],  It was nice seeing you in the clinic today. Here is a summary of what we talked about:  1.  I will order a chest x-ray to rule out any broken ribs.  If you do have broken bone, I send in a short course of opioid pain medication for you.  2.  Please continue to cut back on alcohol use.  Please let us know if you have significant withdrawal symptoms.  3.  Continue Cymbalta, Tylenol and indomethacin for your back pain.  Please return in 3 months  Dr. Alfonse Spruce

## 2023-03-04 NOTE — Progress Notes (Signed)
CC: 1 month f/u on alcohol use disorder  HPI:  [name] is a 62 y.o. living with hypertension, type 2 diabetes, alcohol use disorder who presents to the clinic for 1 month follow-up on his alcohol tapering process.  Please see problem based charting for detail  Past Medical History:  Diagnosis Date   Allergy    Arthritis    Chronic lower back pain    COVID-19 virus infection 01/10/2021   Diabetes mellitus without complication (HCC)    GERD (gastroesophageal reflux disease)    Gout    Hyperlipidemia    Hyperosmolar hyperglycemic state (HHS) ([place]) 01/10/2021   Hypertension    Seasonal allergies    Review of Systems:  per HPI  Physical Exam:  Vitals:   03/04/23 1442 03/04/23 1452  BP: (!) 144/78 132/69  Pulse: (!) 101 96  Temp: 98.6 F (37 C)   TempSrc: Oral   SpO2: 100%   Weight: 225 lb 1.6 oz (102.1 kg)   Height: '5\' 11"'$  (1.803 m)    Physical Exam Constitutional:      General: He is in acute distress.  HENT:     Head: Normocephalic.  Eyes:     General:        Right eye: No discharge.        Left eye: No discharge.     Conjunctiva/sclera: Conjunctivae normal.  Cardiovascular:     Rate and Rhythm: Normal rate and regular rhythm.  Pulmonary:     Effort: Pulmonary effort is normal. No respiratory distress.     Breath sounds: Normal breath sounds. No wheezing.  Chest:     Chest wall: Tenderness (Significant tenderness to palpation of the right anterior chest wall, underneath his right breast.  No hematoma over bruising noted.  No change in overlying skin.) present.  Musculoskeletal:        General: Normal range of motion.     Cervical back: Normal range of motion.     Comments: Mild tremors of outstretched hands  Skin:    General: Skin is warm.  Neurological:     Mental Status: He is alert. Mental status is at baseline.  Psychiatric:        Mood and Affect: Mood normal.      Assessment & Plan:   See Encounters Tab for problem based  charting.  Alcohol use disorder Patient is here to follow-up on his alcohol tapering process.  He has been cutting back slowly and only drink every other day.  He has about 5-6 shots of liquor and 4-5 beers a day.  He reports mild tremor of his hands on the day that he does not drink.  He denies any anxiety symptoms, nausea or vomiting.  He has not had any alcoholic drink today and only has mild tremors of outstretched hands.  His vital signs are within normal limits and he is does not appear in acute withdrawal.  We rediscussed option of adding naltrexone or gabapentin to help with his withdrawal symptoms.  Patient states that he is doing well and does not require medication because he is afraid of polypharmacy.  He will continue to taper in the next few weeks. His plan is to cut back further to every other 2 days and reduce the amount of alcohol as well.  He will let us know if he develops severe symptoms of alcohol withdrawal.  Chest wall pain Patient reports a fall 3 days ago that he fell on his right side.  He developed significant anterior chest wall tenderness after the incident.  Pain is worse with movement.  He states that he has issues finding a comfortable position at night due to pain.  He took 3 tablets of Tylenol 500 mg and also indomethacin without much relief.  He is having issue clearing his secretions due to pain with coughing.  Physical exam reveals significant tenderness to palpation of his anterior right chest wall, beneath his breast.  There is no change to the overlying skin, no hematoma or bruising.  -Obtain chest x-ray to rule out rib fracture.  If confirmed broken bone, will prescribe a short course of opioid for pain control -Encourage patient to use incentive spirometry to help with air movement and reduce risk of pneumonia -Continue Tylenol and indomethacin for pain control for now  Essential hypertension Blood pressure is well-controlled 132/69  -Continue  amlodipine-olmesartan 10-40 mg daily -Kidney function was normal in CMP a few weeks ago   Patient discussed with Dr. Evette Doffing

## 2023-03-04 NOTE — Assessment & Plan Note (Addendum)
Patient reports a fall 3 days ago that he fell on his right side.  He developed significant anterior chest wall tenderness after the incident.  Pain is worse with movement.  He states that he has issues finding a comfortable position at night due to pain.  He took 3 tablets of Tylenol 500 mg and also indomethacin without much relief.  He is having issue clearing his secretions due to pain with coughing.  Physical exam reveals significant tenderness to palpation of his anterior right chest wall, beneath his breast.  There is no change to the overlying skin, no hematoma or bruising.  -Obtain chest x-ray to rule out rib fracture.  If confirmed broken bone, will prescribe a short course of opioid for pain control -Encourage patient to use incentive spirometry to help with air movement and reduce risk of pneumonia -Continue Tylenol and indomethacin for pain control for now

## 2023-03-04 NOTE — Assessment & Plan Note (Signed)
Patient is here to follow-up on his alcohol tapering process.  He has been cutting back slowly and only drink every other day.  He has about 5-6 shots of liquor and 4-5 beers a day.  He reports mild tremor of his hands on the day that he does not drink.  He denies any anxiety symptoms, nausea or vomiting.  He has not had any alcoholic drink today and only has mild tremors of outstretched hands.  His vital signs are within normal limits and he is does not appear in acute withdrawal.  We rediscussed option of adding naltrexone or gabapentin to help with his withdrawal symptoms.  Patient states that he is doing well and does not require medication because he is afraid of polypharmacy.  He will continue to taper in the next few weeks. His plan is to cut back further to every other 2 days and reduce the amount of alcohol as well.  He will let us know if he develops severe symptoms of alcohol withdrawal.

## 2023-03-04 NOTE — Assessment & Plan Note (Signed)
Blood pressure is well-controlled 132/69  -Continue amlodipine-olmesartan 10-40 mg daily -Kidney function was normal in CMP a few weeks ago

## 2023-03-05 ENCOUNTER — Other Ambulatory Visit: Payer: Self-pay | Admitting: Student

## 2023-03-05 DIAGNOSIS — R0789 Other chest pain: Secondary | ICD-10-CM

## 2023-03-05 MED ORDER — LIDOCAINE 4 % EX PTCH: 1.0000 | MEDICATED_PATCH | Freq: Two times a day (BID) | CUTANEOUS | 0 refills | Status: AC | PRN

## 2023-03-05 NOTE — Addendum Note (Signed)
Addended byGaylan Gerold on: 03/05/2023 11:28 AM   Modules accepted: Orders

## 2023-03-05 NOTE — Progress Notes (Signed)
Internal Medicine Clinic Attending  Case discussed with Dr. Nguyen  At the time of the visit.  We reviewed the resident's history and exam and pertinent patient test results.  I agree with the assessment, diagnosis, and plan of care documented in the resident's note. 

## 2023-04-08 ENCOUNTER — Other Ambulatory Visit: Payer: Self-pay | Admitting: Student

## 2023-04-08 DIAGNOSIS — M1A49X0 Other secondary chronic gout, multiple sites, without tophus (tophi): Secondary | ICD-10-CM

## 2023-05-26 ENCOUNTER — Encounter: Payer: Self-pay | Admitting: *Deleted

## 2023-06-10 ENCOUNTER — Ambulatory Visit: Payer: BC Managed Care – PPO | Admitting: Student

## 2023-06-10 ENCOUNTER — Encounter: Payer: Self-pay | Admitting: Student

## 2023-06-10 ENCOUNTER — Other Ambulatory Visit: Payer: Self-pay

## 2023-06-10 VITALS — BP 134/79 | HR 90 | Temp 97.8°F | Ht 70.5 in | Wt 220.5 lb

## 2023-06-10 DIAGNOSIS — I10 Essential (primary) hypertension: Secondary | ICD-10-CM

## 2023-06-10 DIAGNOSIS — Z0000 Encounter for general adult medical examination without abnormal findings: Secondary | ICD-10-CM | POA: Insufficient documentation

## 2023-06-10 DIAGNOSIS — E1169 Type 2 diabetes mellitus with other specified complication: Secondary | ICD-10-CM | POA: Diagnosis not present

## 2023-06-10 DIAGNOSIS — R809 Proteinuria, unspecified: Secondary | ICD-10-CM

## 2023-06-10 DIAGNOSIS — E1129 Type 2 diabetes mellitus with other diabetic kidney complication: Secondary | ICD-10-CM | POA: Diagnosis not present

## 2023-06-10 DIAGNOSIS — M1A49X0 Other secondary chronic gout, multiple sites, without tophus (tophi): Secondary | ICD-10-CM

## 2023-06-10 DIAGNOSIS — F1090 Alcohol use, unspecified, uncomplicated: Secondary | ICD-10-CM

## 2023-06-10 DIAGNOSIS — E785 Hyperlipidemia, unspecified: Secondary | ICD-10-CM

## 2023-06-10 DIAGNOSIS — M109 Gout, unspecified: Secondary | ICD-10-CM

## 2023-06-10 DIAGNOSIS — M5136 Other intervertebral disc degeneration, lumbar region: Secondary | ICD-10-CM

## 2023-06-10 LAB — POCT GLYCOSYLATED HEMOGLOBIN (HGB A1C): Hemoglobin A1C: 6.9 % — AB (ref 4.0–5.6)

## 2023-06-10 LAB — GLUCOSE, CAPILLARY: Glucose-Capillary: 161 mg/dL — ABNORMAL HIGH (ref 70–99)

## 2023-06-10 MED ORDER — EMPAGLIFLOZIN 10 MG PO TABS: 10.0000 mg | ORAL_TABLET | Freq: Every day | ORAL | 3 refills | Status: DC

## 2023-06-10 MED ORDER — INDOMETHACIN 50 MG PO CAPS: ORAL_CAPSULE | ORAL | 0 refills | Status: DC

## 2023-06-10 MED ORDER — METFORMIN HCL 1000 MG PO TABS: 1000.0000 mg | ORAL_TABLET | Freq: Two times a day (BID) | ORAL | 2 refills | Status: DC

## 2023-06-10 MED ORDER — ATORVASTATIN CALCIUM 40 MG PO TABS: 40.0000 mg | ORAL_TABLET | Freq: Every day | ORAL | 5 refills | Status: DC

## 2023-06-10 MED ORDER — ALLOPURINOL 100 MG PO TABS: 100.0000 mg | ORAL_TABLET | Freq: Every day | ORAL | 2 refills | Status: DC

## 2023-06-10 MED ORDER — DULOXETINE HCL 30 MG PO CPEP: 30.0000 mg | ORAL_CAPSULE | Freq: Every day | ORAL | 2 refills | Status: DC

## 2023-06-10 NOTE — Assessment & Plan Note (Signed)
No recent gout flares, will refill indomethacin and allopurinol

## 2023-06-10 NOTE — Assessment & Plan Note (Signed)
A1c at last visit on 02/10/2023 7.5, now is 6.9. Patient states that his diet has improved, and he has incorporated exercise consistently in his daily routine. He is taking metformin 1000mg  twice daily. Denies symptoms such as polyuria, polydipsia, numbness, or tingling. He has an eye appointment set up. Urine microalbumin notably elevated above 500, pt had been hesitant previously to take an SGLT2 given polypharmacy, but now is amenable. Will initiate therapy. Foot exam within normal limits.   Plan:  - Continue metformin 1000mg  BID (refilled today) - Start Jardiance 10mg   - May need repeat urine microalbumin at next visit in six months

## 2023-06-10 NOTE — Assessment & Plan Note (Addendum)
Refilled Lipitor 40mg , last LDL within goal of 64

## 2023-06-10 NOTE — Assessment & Plan Note (Signed)
Refilled Cymbalta, pt states this is adequate in controlling his pain.

## 2023-06-10 NOTE — Progress Notes (Signed)
CC: Diabetes follow up  HPI:  [name] is a 62 y.o. male living with a history stated below and presents today for diabetes follow up. Please see problem based assessment and plan for additional details.  Past Medical History:  Diagnosis Date   Allergy    Arthritis    Chronic lower back pain    COVID-19 virus infection 01/10/2021   Diabetes mellitus without complication (HCC)    GERD (gastroesophageal reflux disease)    Gout    Hyperlipidemia    Hyperosmolar hyperglycemic state (HHS) (HCC) 01/10/2021   Hypertension    Seasonal allergies     Current Outpatient Medications on File Prior to Visit  Medication Sig Dispense Refill   Albuterol Sulfate (PROAIR RESPICLICK) 108 (90 Base) MCG/ACT AEPB Inhale 2 puffs into the lungs every 6 (six) hours as needed. 1 each 0   amLODipine-olmesartan (AZOR) 10-40 MG tablet Take 1 tablet by mouth once daily 30 tablet 5   Blood Glucose Monitoring Suppl (RELION CONFIRM GLUCOSE MONITOR) W/DEVICE KIT 1 Units by Does not apply route once. 1 kit 0   glucose blood (FREESTYLE TEST STRIPS) test strip Use as instructed 100 each 12   tiZANidine (ZANAFLEX) 4 MG tablet Take 4 mg by mouth every 6 (six) hours as needed for muscle spasms.     No current facility-administered medications on file prior to visit.    Family History  Problem Relation Age of Onset   Heart disease Mother    Diabetes Mother    Liver disease Father    Diabetes Sister    Colon polyps Neg Hx    Colon cancer Neg Hx    Esophageal cancer Neg Hx    Stomach cancer Neg Hx    Rectal cancer Neg Hx     Social History   Socioeconomic History   Marital status: Married    Spouse name: Not on file   Number of children: Not on file   Years of education: 8   Highest education level: Not on file  Occupational History   Occupation: employed    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  Tobacco Use   Smoking status: Former    Packs/day: 0.10    Years: 40.00     Additional pack years: 0.00    Total pack years: 4.00    Types: Cigarettes    Quit date: 12/31/2019    Years since quitting: 3.4   Smokeless tobacco: Former   Tobacco comments:    05/08/2018 "used chewing tobacco years ago"  Vaping Use   Vaping Use: Never used  Substance and Sexual Activity   Alcohol use: Yes    Alcohol/week: 33.0 standard drinks of alcohol    Types: 12 Cans of beer, 21 Shots of liquor per week    Comment: "12 pack beer & 2 pints of vodka"   Drug use: No    Comment: Used cocaine "in the 1990s; quit cold [place]" (05/07/2018)   Sexual activity: Not Currently  Other Topics Concern   Not on file  Social History Narrative   Patient lives in [place] with his wife and 3 daughters.   He has have a total of 6 daughters.   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   Social Determinants of Health   Financial Resource Strain: Low Risk  (06/10/2023)   Overall Financial Resource Strain (CARDIA)    Difficulty of Paying Living Expenses: Not hard  at all  Food Insecurity: No Food Insecurity (06/10/2023)   Hunger Vital Sign    Worried About Running Out of Food in the Last Year: Never true    Ran Out of Food in the Last Year: Never true  Transportation Needs: No Transportation Needs (06/10/2023)   PRAPARE - Administrator, Civil Service (Medical): No    Lack of Transportation (Non-Medical): No  Physical Activity: Sufficiently Active (06/10/2023)   Exercise Vital Sign    Days of Exercise per Week: 4 days    Minutes of Exercise per Session: 70 min  Stress: No Stress Concern Present (06/10/2023)   [employer] of Occupational Health - Occupational Stress Questionnaire    Feeling of Stress : Not at all  Social Connections: Moderately Integrated (06/10/2023)   Social Connection and Isolation Panel [NHANES]    Frequency of Communication with Friends and Family: More than three times a week    Frequency of Social Gatherings with Friends  and Family: More than three times a week    Attends Religious Services: More than 4 times per year    Active Member of [employer] or Organizations: No    Attends Banker Meetings: Never    Marital Status: Married  Catering manager Violence: Not At Risk (06/10/2023)   Humiliation, Afraid, Rape, and Kick questionnaire    Fear of Current or Ex-Partner: No    Emotionally Abused: No    Physically Abused: No    Sexually Abused: No    Review of Systems: ROS negative except for what is noted on the assessment and plan.  Vitals:   06/10/23 1426  BP: 134/79  Pulse: 90  Temp: 97.8 F (36.6 C)  TempSrc: Oral  SpO2: 99%  Weight: 220 lb 8 oz (100 kg)  Height: 5' 10.5" (1.791 m)    Physical Exam: Constitutional: well-appearing male  in no acute distress Cardiovascular: regular rate and rhythm, no m/r/g Pulmonary/Chest: normal work of breathing on room air, lungs clear to auscultation bilaterally Neurological: alert & oriented x 3, 5/5 strength in bilateral upper and lower extremities, normal gait, normal sensation in lower extremities Skin: warm and dry   Assessment & Plan:   Type 2 diabetes mellitus with proteinuria (HCC) A1c at last visit on 02/10/2023 7.5, now is 6.9. Patient states that his diet has improved, and he has incorporated exercise consistently in his daily routine. He is taking metformin 1000mg  twice daily. Denies symptoms such as polyuria, polydipsia, numbness, or tingling. He has an eye appointment set up. Urine microalbumin notably elevated above 500, pt had been hesitant previously to take an SGLT2 given polypharmacy, but now is amenable. Will initiate therapy. Foot exam within normal limits.   Plan:  - Continue metformin 1000mg  BID (refilled today) - Start Jardiance 10mg   - May need repeat urine microalbumin at next visit in six months  Hyperlipidemia associated with type 2 diabetes mellitus (HCC) Refilled Lipitor 40mg , last LDL within goal of 64  Gout No  recent gout flares, will refill indomethacin and allopurinol  Alcohol use disorder Pt has decreased amount of shots taken from 4-5 to 3-4. Still drinks about 4-5 beers daily. He is still not interested in medical aid such as naltrexone, and will continue to work on decreasing alcohol intake. Wife in room also will help. Encouraged patient that if he changes in mind and would like medical assistance to return to clinic.   Healthcare maintenance Pt last colonoscopy in 2020 revealed polyps which were removed,  and recommended follow up in 3 years. He is overdue for colonoscopy, so will place referral to GI for one.   Plan:  - GI Referral   DDD (degenerative disc disease), lumbar Refilled Cymbalta, pt states this is adequate in controlling his pain.   Patient discussed with Dr. Dierdre Forth Bertie Mcconathy, M.D. [facility] Internal Medicine, PGY-1 Pager: [phone] Date 06/10/2023 Time 5:59 PM

## 2023-06-10 NOTE — Assessment & Plan Note (Signed)
Pt last colonoscopy in 2020 revealed polyps which were removed, and recommended follow up in 3 years. He is overdue for colonoscopy, so will place referral to GI for one.   Plan:  - GI Referral

## 2023-06-10 NOTE — Patient Instructions (Addendum)
Thank you so much for coming to the clinic today!   I have sent refills for your medications, as well as sent a referral for a colonoscopy. The number for the ortho doctor is [phone]. I am also sending in a medication called Jardiance 10mg , which should help with your kidneys. If you start having painful urination please stop taking it. We can see you back in six months, or earlier if you need to! You're making good progress on quitting alcohol drinking, keep it up, and please let us know if you need any help.   If you have any questions please feel free to the call the clinic at anytime at [phone]. It was a pleasure seeing you!  Best, Dr. Thomasene Ripple

## 2023-06-10 NOTE — Assessment & Plan Note (Addendum)
Pt has decreased amount of shots taken from 4-5 to 3-4. Still drinks about 4-5 beers daily. He is still not interested in medical aid such as naltrexone, and will continue to work on decreasing alcohol intake. Wife in room also will help. Encouraged patient that if he changes in mind and would like medical assistance to return to clinic.

## 2023-06-11 NOTE — Progress Notes (Signed)
Internal Medicine Clinic Attending  Case discussed with Dr. Nooruddin  At the time of the visit.  We reviewed the resident's history and exam and pertinent patient test results.  I agree with the assessment, diagnosis, and plan of care documented in the resident's note.  

## 2023-06-16 ENCOUNTER — Encounter: Payer: Self-pay | Admitting: Gastroenterology

## 2023-06-23 DIAGNOSIS — M170 Bilateral primary osteoarthritis of knee: Secondary | ICD-10-CM | POA: Diagnosis not present

## 2023-06-23 DIAGNOSIS — M25561 Pain in right knee: Secondary | ICD-10-CM | POA: Diagnosis not present

## 2023-06-23 DIAGNOSIS — M25562 Pain in left knee: Secondary | ICD-10-CM | POA: Diagnosis not present

## 2023-08-04 ENCOUNTER — Ambulatory Visit (AMBULATORY_SURGERY_CENTER): Payer: BC Managed Care – PPO

## 2023-08-04 VITALS — Ht 71.0 in | Wt 220.0 lb

## 2023-08-04 DIAGNOSIS — Z86010 Personal history of colonic polyps: Secondary | ICD-10-CM

## 2023-08-04 MED ORDER — NA SULFATE-K SULFATE-MG SULF 17.5-3.13-1.6 GM/177ML PO SOLN: 1.0000 | Freq: Once | ORAL | 0 refills | Status: AC

## 2023-08-04 NOTE — Progress Notes (Signed)

## 2023-08-08 ENCOUNTER — Encounter: Payer: Self-pay | Admitting: Gastroenterology

## 2023-08-12 ENCOUNTER — Other Ambulatory Visit: Payer: Self-pay

## 2023-08-12 DIAGNOSIS — I10 Essential (primary) hypertension: Secondary | ICD-10-CM

## 2023-08-12 MED ORDER — AMLODIPINE-OLMESARTAN 10-40 MG PO TABS: 1.0000 | ORAL_TABLET | Freq: Every day | ORAL | 5 refills | Status: DC

## 2023-08-21 ENCOUNTER — Other Ambulatory Visit: Payer: Self-pay

## 2023-08-21 DIAGNOSIS — M1A49X0 Other secondary chronic gout, multiple sites, without tophus (tophi): Secondary | ICD-10-CM

## 2023-08-21 MED ORDER — INDOMETHACIN 50 MG PO CAPS: ORAL_CAPSULE | ORAL | 0 refills | Status: DC

## 2023-08-25 ENCOUNTER — Encounter: Payer: Self-pay | Admitting: Gastroenterology

## 2023-08-25 ENCOUNTER — Ambulatory Visit (AMBULATORY_SURGERY_CENTER): Payer: BC Managed Care – PPO | Admitting: Gastroenterology

## 2023-08-25 VITALS — BP 129/94 | HR 75 | Temp 98.4°F | Resp 10 | Ht 70.5 in | Wt 220.0 lb

## 2023-08-25 DIAGNOSIS — Z86010 Personal history of colonic polyps: Secondary | ICD-10-CM | POA: Diagnosis not present

## 2023-08-25 DIAGNOSIS — Z09 Encounter for follow-up examination after completed treatment for conditions other than malignant neoplasm: Secondary | ICD-10-CM

## 2023-08-25 DIAGNOSIS — Z1211 Encounter for screening for malignant neoplasm of colon: Secondary | ICD-10-CM | POA: Diagnosis not present

## 2023-08-25 MED ORDER — SODIUM CHLORIDE 0.9 % IV SOLN: 500.0000 mL | Freq: Once | INTRAVENOUS | Status: DC

## 2023-08-25 NOTE — Progress Notes (Unsigned)
Vss nad trans to pacu 

## 2023-08-25 NOTE — Progress Notes (Unsigned)
[facility] History and Physical   Primary Care Physician:  Olegario Messier, MD   Reason for Procedure:  History of adenomatous colon polyps  Plan:    Surveillance colonoscopy with possible interventions as needed     HPI: [name] is a very pleasant 62 y.o. male here for surveillance colonoscopy. Denies any nausea, vomiting, abdominal pain, melena or bright red blood per rectum  The risks and benefits as well as alternatives of endoscopic procedure(s) have been discussed and reviewed. All questions answered. The patient agrees to proceed.    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 Authorizing Provider  amLODipine-olmesartan (AZOR) 10-40 MG tablet Take 1 tablet by mouth daily. 08/12/23  Yes Nooruddin, Jason Fila, MD  atorvastatin (LIPITOR) 40 MG tablet Take 1 tablet (40 mg total) by mouth daily. 06/10/23  Yes Nooruddin, Jason Fila, MD  indomethacin (INDOCIN) 50 MG capsule TAKE 1 CAPSULE BY MOUTH TWICE DAILY AS NEEDED 08/21/23  Yes Nooruddin, Jason Fila, MD  metFORMIN (GLUCOPHAGE) 1000 MG tablet Take 1 tablet (1,000 mg total) by mouth 2 (two) times daily with a meal. 06/10/23 09/08/23 Yes Nooruddin, Jason Fila, MD  Albuterol Sulfate (PROAIR RESPICLICK) 108  (90 Base) MCG/ACT AEPB Inhale 2 puffs into the lungs every 6 (six) hours as needed. Patient not taking: Reported on 08/25/2023 07/22/18   Ali Lowe, MD  allopurinol (ZYLOPRIM) 100 MG tablet Take 1 tablet (100 mg total) by mouth daily. 06/10/23   Nooruddin, Jason Fila, MD  Blood Glucose Monitoring Suppl (RELION CONFIRM GLUCOSE MONITOR) W/DEVICE KIT 1 Units by Does not apply route once. 05/29/14   Yolanda Manges, DO  DULoxetine (CYMBALTA) 30 MG capsule Take 1 capsule (30 mg total) by mouth daily. Patient not taking: Reported on 08/04/2023 06/10/23 06/09/24  Nooruddin, Jason Fila, MD  glucose blood (FREESTYLE TEST STRIPS) test strip Use as instructed Patient not taking: Reported on 08/25/2023 05/29/14   Yolanda Manges, DO    Current Outpatient Medications  Medication Sig Dispense Refill   amLODipine-olmesartan (AZOR) 10-40 MG tablet Take 1 tablet by mouth daily. 30 tablet 5   atorvastatin (LIPITOR) 40 MG tablet Take 1 tablet (40 mg total) by mouth daily. 30 tablet 5   indomethacin (INDOCIN) 50 MG capsule TAKE 1 CAPSULE BY MOUTH TWICE DAILY AS NEEDED 30 capsule 0   metFORMIN (GLUCOPHAGE) 1000 MG tablet Take 1 tablet (1,000 mg total) by mouth 2 (two) times daily with a meal. 60 tablet 2   Albuterol Sulfate (PROAIR RESPICLICK) 108 (90 Base) MCG/ACT AEPB Inhale 2 puffs into the lungs every 6 (six) hours as needed. (Patient not taking: Reported on 08/25/2023) 1 each 0   allopurinol (ZYLOPRIM) 100  MG tablet Take 1 tablet (100 mg total) by mouth daily. 90 tablet 2   Blood Glucose Monitoring Suppl (RELION CONFIRM GLUCOSE MONITOR) W/DEVICE KIT 1 Units by Does not apply route once. 1 kit 0   DULoxetine (CYMBALTA) 30 MG capsule Take 1 capsule (30 mg total) by mouth daily. (Patient not taking: Reported on 08/04/2023) 30 capsule 2   glucose blood (FREESTYLE TEST STRIPS) test strip Use as instructed (Patient not taking: Reported on 08/25/2023) 100 each 12   Current Facility-Administered Medications  Medication Dose Route Frequency  Provider Last Rate Last Admin   0.9 %  sodium chloride infusion  500 mL Intravenous Once Napoleon Form, MD        Allergies as of 08/25/2023 - Review Complete 08/25/2023  Allergen Reaction Noted   Tramadol Itching 12/02/2019    Family History  Problem Relation Age of Onset   Heart disease Mother    Diabetes Mother    Liver disease Father    Diabetes Sister    Colon cancer Paternal Aunt    Colon polyps Neg Hx    Esophageal cancer Neg Hx    Stomach cancer Neg Hx    Rectal cancer Neg Hx     Social History   Socioeconomic History   Marital status: Married    Spouse name: Not on file   Number of children: Not on file   Years of education: 8   Highest education level: Not on file  Occupational History   Occupation: employed    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  Tobacco Use   Smoking status: Former    Current packs/day: 0.00    Average packs/day: 0.1 packs/day for 40.0 years (4.0 ttl pk-yrs)    Types: Cigarettes    Start date: 12/31/1979    Quit date: 12/31/2019    Years since quitting: 3.6   Smokeless tobacco: Former   Tobacco comments:    05/08/2018 "used chewing tobacco years ago"  Vaping Use   Vaping status: Never Used  Substance and Sexual Activity   Alcohol use: Yes    Alcohol/week: 33.0 standard drinks of alcohol    Types: 12 Cans of beer, 21 Shots of liquor per week    Comment: "12 pack beer & 2 pints of vodka"   Drug use: No    Comment: Used cocaine "in the 1990s; quit cold [place]" (05/07/2018)   Sexual activity: Not Currently  Other Topics Concern   Not on file  Social History Narrative   Patient lives in [place] with his wife and 3 daughters.   He has have a total of 6 daughters.   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   Social Determinants of Health   Financial Resource Strain: Low Risk  (06/10/2023)   Overall Financial Resource Strain (CARDIA)    Difficulty of Paying Living Expenses:  Not hard at all  Food Insecurity: No Food Insecurity (06/10/2023)   Hunger Vital Sign    Worried About Running Out of Food in the Last Year: Never true    Ran Out of Food in the Last Year: Never true  Transportation Needs: No Transportation Needs (06/10/2023)   PRAPARE - Administrator, Civil Service (Medical): No    Lack of Transportation (Non-Medical): No  Physical Activity: Sufficiently Active (06/10/2023)   Exercise Vital Sign    Days of Exercise per Week: 4 days    Minutes of  Exercise per Session: 70 min  Stress: No Stress Concern Present (06/10/2023)   [employer] of Occupational Health - Occupational Stress Questionnaire    Feeling of Stress : Not at all  Social Connections: Moderately Integrated (06/10/2023)   Social Connection and Isolation Panel [NHANES]    Frequency of Communication with Friends and Family: More than three times a week    Frequency of Social Gatherings with Friends and Family: More than three times a week    Attends Religious Services: More than 4 times per year    Active Member of [employer] or Organizations: No    Attends Banker Meetings: Never    Marital Status: Married  Catering manager Violence: Not At Risk (06/10/2023)   Humiliation, Afraid, Rape, and Kick questionnaire    Fear of Current or Ex-Partner: No    Emotionally Abused: No    Physically Abused: No    Sexually Abused: No    Review of Systems:  All other review of systems negative except as mentioned in the HPI.  Physical Exam: Vital signs in last 24 hours: BP 136/75   Pulse 89   Temp 98.4 F (36.9 C)   Ht 5' 10.5" (1.791 m)   Wt 220 lb (99.8 kg)   SpO2 97%   BMI 31.12 kg/m  General:   Alert, NAD Lungs:  Clear .   Heart:  Regular rate and rhythm Abdomen:  Soft, nontender and nondistended. Neuro/Psych:  Alert and cooperative. Normal mood and affect. A and O x 3  Reviewed labs, radiology imaging, old records and pertinent past GI work up  Patient is  appropriate for planned procedure(s) and anesthesia in an ambulatory setting   K. Scherry Ran , MD [phone]

## 2023-08-25 NOTE — Op Note (Signed)
[facility] Patient Name: [name] Procedure Date: 08/25/2023 2:05 PM MRN: [id number] Endoscopist: Napoleon Form , MD, [id number] Age: 62 Referring MD:  Date of Birth: 10-Feb-1961 Gender: Male Account #: [id number] Procedure:                Colonoscopy Indications:              High risk colon cancer surveillance: Personal                            history of colonic polyps, High risk colon cancer                            surveillance: Personal history of adenoma (10 mm or                            greater in size), High risk colon cancer                            surveillance: Personal history of multiple (3 or                            more) adenomas Medicines:                Monitored Anesthesia Care Procedure:                Pre-Anesthesia Assessment:                           - Prior to the procedure, a History and Physical                            was performed, and patient medications and                            allergies were reviewed. The patient's tolerance of                            previous anesthesia was also reviewed. The risks                            and benefits of the procedure and the sedation                            options and risks were discussed with the patient.                            All questions were answered, and informed consent                            was obtained. Prior Anticoagulants: The patient has                            taken no anticoagulant or antiplatelet agents. ASA  Grade Assessment: III - A patient with severe                            systemic disease. After reviewing the risks and                            benefits, the patient was deemed in satisfactory                            condition to undergo the procedure.                           After obtaining informed consent, the colonoscope                            was passed under direct vision. Throughout the                             procedure, the patient's blood pressure, pulse, and                            oxygen saturations were monitored continuously. The                            Olympus Scope SN: [id number] was introduced through                            the anus and advanced to the the cecum, identified                            by appendiceal orifice and ileocecal valve. The                            colonoscopy was performed without difficulty. The                            patient tolerated the procedure well. The quality                            of the bowel preparation was good. The ileocecal                            valve, appendiceal orifice, and rectum were                            photographed. Scope In: 2:16:29 PM Scope Out: 2:31:48 PM Scope Withdrawal Time: 0 hours 11 minutes 21 seconds  Total Procedure Duration: 0 hours 15 minutes 19 seconds  Findings:                 The perianal and digital rectal examinations were                            normal.  Scattered small-mouthed diverticula were found in                            the sigmoid colon, descending colon, transverse                            colon and ascending colon.                           Non-bleeding internal hemorrhoids were found during                            retroflexion. The hemorrhoids were medium-sized. Complications:            No immediate complications. Estimated Blood Loss:     Estimated blood loss was minimal. Impression:               - Mild diverticulosis in the sigmoid colon, in the                            descending colon, in the transverse colon and in                            the ascending colon.                           - Non-bleeding internal hemorrhoids.                           - No specimens collected. Recommendation:           - Patient has a contact number available for                            emergencies. The signs and symptoms of  potential                            delayed complications were discussed with the                            patient. Return to normal activities tomorrow.                            Written discharge instructions were provided to the                            patient.                           - Resume previous diet.                           - Continue present medications.                           - Repeat colonoscopy in 5 years for surveillance. Napoleon Form, MD 08/25/2023 2:39:14 PM This report has been signed electronically.

## 2023-08-25 NOTE — Progress Notes (Signed)
Pt's states no medical or surgical changes since previsit or office visit. 

## 2023-08-25 NOTE — Patient Instructions (Signed)
Handouts provided on diverticulosis and hemorrhoids.  Resume previous diet.  Continue present medications.  Repeat colonoscopy in 5 years for surveillance.   YOU HAD AN ENDOSCOPIC PROCEDURE TODAY AT THE [facility]:   Refer to the procedure report that was given to you for any specific questions about what was found during the examination.  If the procedure report does not answer your questions, please call your gastroenterologist to clarify.  If you requested that your care partner not be given the details of your procedure findings, then the procedure report has been included in a sealed envelope for you to review at your convenience later.  YOU SHOULD EXPECT: Some feelings of bloating in the abdomen. Passage of more gas than usual.  Walking can help get rid of the air that was put into your GI tract during the procedure and reduce the bloating. If you had a lower endoscopy (such as a colonoscopy or flexible sigmoidoscopy) you may notice spotting of blood in your stool or on the toilet paper. If you underwent a bowel prep for your procedure, you may not have a normal bowel movement for a few days.  Please Note:  You might notice some irritation and congestion in your nose or some drainage.  This is from the oxygen used during your procedure.  There is no need for concern and it should clear up in a day or so.  SYMPTOMS TO REPORT IMMEDIATELY:  Following lower endoscopy (colonoscopy or flexible sigmoidoscopy):  Excessive amounts of blood in the stool  Significant tenderness or worsening of abdominal pains  Swelling of the abdomen that is new, acute  Fever of 100F or higher  For urgent or emergent issues, a gastroenterologist can be reached at any hour by calling [phone]. Do not use MyChart messaging for urgent concerns.    DIET:  We do recommend a small meal at first, but then you may proceed to your regular diet.  Drink plenty of fluids but you should avoid alcoholic  beverages for 24 hours.  ACTIVITY:  You should plan to take it easy for the rest of today and you should NOT DRIVE or use heavy machinery until tomorrow (because of the sedation medicines used during the test).    FOLLOW UP: Our staff will call the number listed on your records the next business day following your procedure.  We will call around 7:15- 8:00 am to check on you and address any questions or concerns that you may have regarding the information given to you following your procedure. If we do not reach you, we will leave a message.     If any biopsies were taken you will be contacted by phone or by letter within the next 1-3 weeks.  Please call us at [phone] if you have not heard about the biopsies in 3 weeks.    SIGNATURES/CONFIDENTIALITY: You and/or your care partner have signed paperwork which will be entered into your electronic medical record.  These signatures attest to the fact that that the information above on your After Visit Summary has been reviewed and is understood.  Full responsibility of the confidentiality of this discharge information lies with you and/or your care-partner.

## 2023-08-26 ENCOUNTER — Telehealth: Payer: Self-pay | Admitting: *Deleted

## 2023-08-26 NOTE — Telephone Encounter (Signed)
  Follow up Call-     08/25/2023    1:29 PM  Call back number  Post procedure Call Back phone  # [phone]  Permission to leave phone message Yes     Patient questions:  Do you have a fever, pain , or abdominal swelling? No. Pain Score  0 *  Have you tolerated food without any problems? Yes.    Have you been able to return to your normal activities? Yes.    Do you have any questions about your discharge instructions: Diet   No. Medications  No. Follow up visit  No.  Do you have questions or concerns about your Care? No.  Actions: * If pain score is 4 or above: No action needed, pain <4.

## 2023-12-01 ENCOUNTER — Other Ambulatory Visit: Payer: Self-pay | Admitting: Student

## 2023-12-01 DIAGNOSIS — E1129 Type 2 diabetes mellitus with other diabetic kidney complication: Secondary | ICD-10-CM

## 2023-12-18 ENCOUNTER — Ambulatory Visit: Payer: BC Managed Care – PPO | Admitting: Internal Medicine

## 2023-12-18 ENCOUNTER — Encounter: Payer: Self-pay | Admitting: Internal Medicine

## 2023-12-18 DIAGNOSIS — J069 Acute upper respiratory infection, unspecified: Secondary | ICD-10-CM | POA: Diagnosis not present

## 2023-12-18 MED ORDER — BENZONATATE 100 MG PO CAPS: 100.0000 mg | ORAL_CAPSULE | Freq: Four times a day (QID) | ORAL | 1 refills | Status: AC | PRN

## 2023-12-18 NOTE — Progress Notes (Signed)
   CC: sore throat  This is a telephone encounter between [facility] and [name] on 12/18/2023 for sore throat, cough, congestion. The visit was conducted with the patient located at home and [name] at [facility]. The patient's identity was confirmed using their DOB and current address. The patient has consented to being evaluated through a telephone encounter and understands the associated risks (an examination cannot be done and the patient may need to come in for an appointment) / benefits (allows the patient to remain at home, decreasing exposure to coronavirus). I personally spent 20 minutes on medical discussion.   [name] is a 62 y.o. with PMH as below.   Please see A&P for assessment of the patient's acute and chronic medical conditions.   Past Medical History:  Diagnosis Date   Allergy    Arthritis    Chronic lower back pain    COVID-19 virus infection 01/10/2021   Diabetes mellitus without complication (HCC)    GERD (gastroesophageal reflux disease)    Gout    Hyperlipidemia    Hyperosmolar hyperglycemic state (HHS) (HCC) 01/10/2021   Hypertension    Seasonal allergies    Review of Systems:  see HPI  Assessment & Plan:   Patient reports sore throat, productive cough, sinus congestion, runny nose, myalgias since Monday of this week. He denies fever, chest pain, severe shortness of breath. Denies sick contacts. Has not had flu shot or Covid booster. He has had to call out of work since Monday. He feels like his symptoms are finally starting to improve since this morning. He has been treating his symptoms with Dayquil.   Discussed with patient that his symptoms are most likely due to viral URI rather than bacterial infection. Recommended symptomatic management with Tessalon perles, Dayquil as needed. Encouraged hydration. Return precautions given for worsening symptoms, fever, worsening shortness of breath.   Patient has not been to the clinic for several months.  Message sent to schedulers to arrange routine visit with patient in 2-3 weeks for management of chronic conditions. Also per chart review there was a thyroid nodule noted as incidental finding on CT, follow up ultrasound never completed.   20 minutes spent in discussion with patient and medical decision making.   Patient discussed with Dr. Melida Quitter Internal Medicine Resident

## 2023-12-29 NOTE — Progress Notes (Signed)
Internal Medicine Clinic Attending  Case discussed with the resident at the time of the visit.  We reviewed the resident's history and exam and pertinent patient test results.  I agree with the assessment, diagnosis, and plan of care documented in the resident's note.  

## 2024-01-06 ENCOUNTER — Ambulatory Visit: Payer: BC Managed Care – PPO | Admitting: Internal Medicine

## 2024-01-06 ENCOUNTER — Encounter: Payer: Self-pay | Admitting: Internal Medicine

## 2024-01-06 ENCOUNTER — Other Ambulatory Visit (HOSPITAL_COMMUNITY): Payer: Self-pay

## 2024-01-06 VITALS — BP 125/62 | HR 96 | Temp 98.2°F | Ht 70.5 in | Wt 232.3 lb

## 2024-01-06 DIAGNOSIS — E041 Nontoxic single thyroid nodule: Secondary | ICD-10-CM | POA: Diagnosis not present

## 2024-01-06 DIAGNOSIS — E1129 Type 2 diabetes mellitus with other diabetic kidney complication: Secondary | ICD-10-CM

## 2024-01-06 DIAGNOSIS — R809 Proteinuria, unspecified: Secondary | ICD-10-CM | POA: Diagnosis not present

## 2024-01-06 DIAGNOSIS — I10 Essential (primary) hypertension: Secondary | ICD-10-CM

## 2024-01-06 DIAGNOSIS — F1090 Alcohol use, unspecified, uncomplicated: Secondary | ICD-10-CM

## 2024-01-06 DIAGNOSIS — Z720 Tobacco use: Secondary | ICD-10-CM

## 2024-01-06 DIAGNOSIS — M5442 Lumbago with sciatica, left side: Secondary | ICD-10-CM

## 2024-01-06 DIAGNOSIS — E1169 Type 2 diabetes mellitus with other specified complication: Secondary | ICD-10-CM | POA: Diagnosis not present

## 2024-01-06 DIAGNOSIS — F17211 Nicotine dependence, cigarettes, in remission: Secondary | ICD-10-CM

## 2024-01-06 DIAGNOSIS — F17200 Nicotine dependence, unspecified, uncomplicated: Secondary | ICD-10-CM

## 2024-01-06 DIAGNOSIS — M1A49X0 Other secondary chronic gout, multiple sites, without tophus (tophi): Secondary | ICD-10-CM

## 2024-01-06 DIAGNOSIS — E785 Hyperlipidemia, unspecified: Secondary | ICD-10-CM

## 2024-01-06 DIAGNOSIS — Z7984 Long term (current) use of oral hypoglycemic drugs: Secondary | ICD-10-CM

## 2024-01-06 DIAGNOSIS — G8929 Other chronic pain: Secondary | ICD-10-CM

## 2024-01-06 LAB — GLUCOSE, CAPILLARY: Glucose-Capillary: 160 mg/dL — ABNORMAL HIGH (ref 70–99)

## 2024-01-06 LAB — POCT GLYCOSYLATED HEMOGLOBIN (HGB A1C): Hemoglobin A1C: 7.2 % — AB (ref 4.0–5.6)

## 2024-01-06 MED ORDER — INDOMETHACIN 50 MG PO CAPS
ORAL_CAPSULE | ORAL | 0 refills | Status: DC
  Filled 2024-01-06: qty 30, 15d supply, fill #0

## 2024-01-06 MED ORDER — EMPAGLIFLOZIN 10 MG PO TABS
10.0000 mg | ORAL_TABLET | Freq: Every day | ORAL | 2 refills | Status: DC
  Filled 2024-01-06: qty 30, 30d supply, fill #0

## 2024-01-06 NOTE — Assessment & Plan Note (Signed)
 BP today is 125/62, at goal. Continue current regimen of amlodipine-olmesartan 10-40mg 

## 2024-01-06 NOTE — Assessment & Plan Note (Addendum)
 A1c today is 7.2 from 6.9 when last checked. Current regimen is metformin  1000mg  BID. Urine microalbumin was elevated above 500 at last visit. Jardiance  was previously discussed and prescribed but patient never picked up from the pharmacy.  - Continue metformin  1000mg  BID, start Jardiance  10mg . Patient will call back the clinic if price of SGLT2 is prohibitive - BMP, Urine MCR today - Return in 3 months for A1c check

## 2024-01-06 NOTE — Assessment & Plan Note (Signed)
 Takes atorvastatin 40mg  daily - Lipid profile today

## 2024-01-06 NOTE — Progress Notes (Signed)
 Subjective:  CC: DM follow up  HPI:  [name] is a 62 y.o. male with a past medical history stated below and presents today for above. Please see problem based assessment and plan for additional details.  Past Medical History:  Diagnosis Date   Allergy    Arthritis    Chronic lower back pain    COVID-19 virus infection 01/10/2021   Diabetes mellitus without complication (HCC)    GERD (gastroesophageal reflux disease)    Gout    Hyperlipidemia    Hyperosmolar hyperglycemic state (HHS) (HCC) 01/10/2021   Hypertension    Seasonal allergies    Tobacco use disorder 08/31/2013    Current Outpatient Medications on File Prior to Visit  Medication Sig Dispense Refill   Albuterol  Sulfate (PROAIR  RESPICLICK) 108 (90 Base) MCG/ACT AEPB Inhale 2 puffs into the lungs every 6 (six) hours as needed. (Patient not taking: Reported on 08/25/2023) 1 each 0   allopurinol  (ZYLOPRIM ) 100 MG tablet Take 1 tablet (100 mg total) by mouth daily. 90 tablet 2   amLODipine -olmesartan  (AZOR ) 10-40 MG tablet Take 1 tablet by mouth daily. 30 tablet 5   atorvastatin  (LIPITOR) 40 MG tablet Take 1 tablet (40 mg total) by mouth daily. 30 tablet 5   benzonatate  (TESSALON  PERLES) 100 MG capsule Take 1 capsule (100 mg total) by mouth every 6 (six) hours as needed for cough. 30 capsule 1   Blood Glucose Monitoring Suppl (RELION CONFIRM GLUCOSE MONITOR) W/DEVICE KIT 1 Units by Does not apply route once. 1 kit 0   glucose blood (FREESTYLE TEST STRIPS) test strip Use as instructed (Patient not taking: Reported on 08/25/2023) 100 each 12   metFORMIN  (GLUCOPHAGE ) 1000 MG tablet TAKE 1 TABLET BY MOUTH TWICE DAILY WITH A MEAL 180 tablet 0   No current facility-administered medications on file prior to visit.    Review of Systems: ROS negative except for as is noted on the assessment and plan.  Objective:   Vitals:   01/06/24 0932  BP: 125/62  Pulse: 96  Temp: 98.2 F (36.8 C)  TempSrc: Oral  SpO2: 97%   Weight: 232 lb 4.8 oz (105.4 kg)  Height: 5' 10.5 (1.791 m)    Physical Exam: Constitutional: well-appearing, in no acute distress Cardiovascular: regular rate and rhythm Pulmonary/Chest: normal work of breathing on room air, bilateral upper lobe congestion on auscultation MSK: normal bulk and tone  Assessment & Plan:   Essential hypertension BP today is 125/62, at goal. Continue current regimen of amlodipine -olmesartan  10-40mg   Type 2 diabetes mellitus with proteinuria (HCC) A1c today is 7.2 from 6.9 when last checked. Current regimen is metformin  1000mg  BID. Urine microalbumin was elevated above 500 at last visit. Jardiance  was previously discussed and prescribed but patient never picked up from the pharmacy.  - Continue metformin  1000mg  BID, start Jardiance  10mg . Patient will call back the clinic if price of SGLT2 is prohibitive - BMP, Urine MCR today - Return in 3 months for A1c check  Hyperlipidemia associated with type 2 diabetes mellitus (HCC) Takes atorvastatin  40mg  daily - Lipid profile today  Tobacco use disorder Quit cigarettes 3 years ago. Low dose chest CT from 2023 shows lung rads 2 with recommendation for annual screening.  - Low dose chest CT ordered  Alcohol use disorder Patient reports drinking approximately 7 shots of liquor per night. He is not interested in quitting or decreasing at this time. He drinks partly to control his chronic lower back pain. Encouraged to decrease alcohol  intake, and that more options for pain control may be available if he is drinking less. Informed that recent chest CT showed findings concerning for cirrhosis.   Thyroid  nodule Chest CT from 2023 shows right thyroid  nodule extending into the upper chest. Patient denies noticing any neck asymmetry. No nodules palpated.  - Thyroid  ultrasound ordered  Chronic low back pain (1ry area of Pain) (Bilateral) (L>R) w/ sciatica (Left) Patient feels his chronic low back pain is manageable  with over the counter medications and with alcohol. He remains active and mobile. He was previously referred to surgery who intended to perform surgery, but ultimately the surgery did not take place. He was previously taking Cymbalta  but did not feel it helped his pain. Discussed other options such as gabapentin, which would be safer to take if he could decrease his alcohol consumption. We will continue to discuss other options for pain control if patient wishes to and is motivated to decrease his drinking.     Patient discussed with Dr. Forest Redell Burnet MD [facility]  PGY-1 Pager: [phone] Date 01/06/2024  Time 11:40 AM

## 2024-01-06 NOTE — Assessment & Plan Note (Signed)
 Patient reports drinking approximately 7 shots of liquor per night. He is not interested in quitting or decreasing at this time. He drinks partly to control his chronic lower back pain. Encouraged to decrease alcohol intake, and that more options for pain control may be available if he is drinking less. Informed that recent chest CT showed findings concerning for cirrhosis.

## 2024-01-06 NOTE — Assessment & Plan Note (Signed)
 Quit cigarettes 3 years ago. Low dose chest CT from 2023 shows lung rads 2 with recommendation for annual screening.  - Low dose chest CT ordered

## 2024-01-06 NOTE — Assessment & Plan Note (Signed)
 Chest CT from 2023 shows right thyroid nodule extending into the upper chest. Patient denies noticing any neck asymmetry. No nodules palpated.  - Thyroid ultrasound ordered

## 2024-01-06 NOTE — Assessment & Plan Note (Signed)
 Patient feels his chronic low back pain is manageable with over the counter medications and with alcohol. He remains active and mobile. He was previously referred to surgery who intended to perform surgery, but ultimately the surgery did not take place. He was previously taking Cymbalta  but did not feel it helped his pain. Discussed other options such as gabapentin, which would be safer to take if he could decrease his alcohol consumption. We will continue to discuss other options for pain control if patient wishes to and is motivated to decrease his drinking.

## 2024-01-06 NOTE — Patient Instructions (Signed)
 [name],   It was a pleasure seeing you today.   I am starting a medication called Jardiance , which is good for helping your blood sugar and also helps prevent kidney disease. Please take 10mg  every day. We will plan to see you again in about 3 months to check your A1c again.   I have ordered a chest CT and a thyroid  ultrasound. I am also checking some bloodwork, and I will call you with any abnormal results.   Thanks,  Dr Francella

## 2024-01-07 LAB — LIPID PANEL
Chol/HDL Ratio: 2.5 {ratio} (ref 0.0–5.0)
Cholesterol, Total: 169 mg/dL (ref 100–199)
HDL: 67 mg/dL (ref >39)
LDL Chol Calc (NIH): 52 mg/dL (ref 0–99)
Triglycerides: 335 mg/dL — ABNORMAL HIGH (ref 0–149)
VLDL Cholesterol Cal: 50 mg/dL — ABNORMAL HIGH (ref 5–40)

## 2024-01-07 LAB — MICROALBUMIN / CREATININE URINE RATIO
Creatinine, Urine: 70.7 mg/dL
Microalb/Creat Ratio: 248 mg/g{creat} — ABNORMAL HIGH (ref 0–29)
Microalbumin, Urine: 175.4 ug/mL

## 2024-01-07 LAB — BASIC METABOLIC PANEL
BUN/Creatinine Ratio: 25 — ABNORMAL HIGH (ref 10–24)
BUN: 29 mg/dL — ABNORMAL HIGH (ref 8–27)
CO2: 17 mmol/L — ABNORMAL LOW (ref 20–29)
Calcium: 9.8 mg/dL (ref 8.6–10.2)
Chloride: 110 mmol/L — ABNORMAL HIGH (ref 96–106)
Creatinine, Ser: 1.14 mg/dL (ref 0.76–1.27)
Glucose: 137 mg/dL — ABNORMAL HIGH (ref 70–99)
Potassium: 4.8 mmol/L (ref 3.5–5.2)
Sodium: 140 mmol/L (ref 134–144)
eGFR: 72 mL/min/{1.73_m2} (ref >59)

## 2024-01-14 NOTE — Progress Notes (Signed)
 Internal Medicine Clinic Attending  Case discussed with the resident at the time of the visit.  We reviewed the resident's history and exam and pertinent patient test results.  I agree with the assessment, diagnosis, and plan of care documented in the resident's note.

## 2024-01-21 ENCOUNTER — Ambulatory Visit (HOSPITAL_COMMUNITY): Payer: BC Managed Care – PPO

## 2024-01-26 ENCOUNTER — Other Ambulatory Visit: Payer: Self-pay | Admitting: Student

## 2024-01-26 DIAGNOSIS — I10 Essential (primary) hypertension: Secondary | ICD-10-CM

## 2024-02-16 ENCOUNTER — Other Ambulatory Visit: Payer: Self-pay | Admitting: Student

## 2024-02-16 DIAGNOSIS — I10 Essential (primary) hypertension: Secondary | ICD-10-CM

## 2024-02-16 DIAGNOSIS — E1129 Type 2 diabetes mellitus with other diabetic kidney complication: Secondary | ICD-10-CM

## 2024-05-06 ENCOUNTER — Other Ambulatory Visit: Payer: Self-pay | Admitting: Student

## 2024-05-06 DIAGNOSIS — E1129 Type 2 diabetes mellitus with other diabetic kidney complication: Secondary | ICD-10-CM

## 2024-05-06 DIAGNOSIS — I10 Essential (primary) hypertension: Secondary | ICD-10-CM

## 2024-05-06 DIAGNOSIS — M109 Gout, unspecified: Secondary | ICD-10-CM

## 2024-05-07 NOTE — Telephone Encounter (Signed)
 Medication sent to pharmacy

## 2024-05-10 ENCOUNTER — Ambulatory Visit: Payer: Self-pay | Admitting: *Deleted

## 2024-05-10 NOTE — Telephone Encounter (Signed)
 Copied from CRM [id number]. Topic: Clinical - Red Word Triage >> May 10, 2024 11:19 AM Retta Caster wrote: Red Word that prompted transfer to Nurse Triage:  05/08/24- BP 158/102 BPM 110  05/11- 160/106 BMP 106 05/11/24-146/104 BMP 109 also patient has lost weight Reason for Disposition  Systolic BP  >= 180 OR Diastolic >= 110    Elevated BP, with symptoms  Answer Assessment - Initial Assessment Questions 1. BLOOD PRESSURE: "What is the blood pressure?" "Did you take at least two measurements 5 minutes apart?"     BP this 146/104.    I lost 13 lbs.   I'm light headed and staggering around.   I don't want to fall out at work.   2. ONSET: "When did you take your blood pressure?"     This morning 146/104  On 5/10 2025 158/102.  Elevated readings since then.    I have diabetes too.   I've not been checking my sugar regularly.   3. HOW: "How did you take your blood pressure?" (e.g., automatic home BP monitor, visiting nurse)     Home BP monitor. 4. HISTORY: "Do you have a history of high blood pressure?"     Yes I have diarrhea.    5. MEDICINES: "Are you taking any medicines for blood pressure?" "Have you missed any doses recently?"     Yes 6. OTHER SYMPTOMS: "Do you have any symptoms?" (e.g., blurred vision, chest pain, difficulty breathing, headache, weakness)     I'm having dizziness, high BP, lightheadedness, weakness. 7. PREGNANCY: "Is there any chance you are pregnant?" "When was your last menstrual period?"     N/A  Protocols used: Blood Pressure - High-A-AH  Chief Complaint: B is elevated 146/104. Symptoms: Having dizziness and lightheadedness.   Stayed home from work today.   Wanting to see if he can be seen this afternoon or tomorrow afternoon when his wife can bring him in.  He has lost 13 lbs.  He said he didn't go to work today because he was afraid he night fall at work. Frequency: Since about 05/08/2024 he has be feeling this way. Pertinent Negatives: Patient denies falling.   Also has  diabetes. Disposition: [] ED /[] Urgent Care (no appt availability in office) / [x] Appointment(In office/virtual)/ []  [facility]/ [] Home Care/ [] Refused Recommended Disposition /[] [facility]/ [x]  Follow-up with PCP Additional Notes: Pt is asking to see if any of the providers can work him in today or tomorrow or Wed. At 4:00 or after.   That's when his wife gets off and can bring him in.   He is not driving.   Please call and advise pt.   No appts available in the afternoons.

## 2024-05-10 NOTE — Telephone Encounter (Signed)
 RTC to patient informed him that there are no appointments in the Clinics after 4 PM.  He will need to go to an Urgent care or the ER for follow up of his blood pressures.  Patient voiced understanding of and will go to the ER after 4 PM.

## 2024-05-11 ENCOUNTER — Ambulatory Visit: Admitting: Student

## 2024-05-11 VITALS — BP 158/103 | HR 110 | Temp 97.5°F | Ht 70.0 in | Wt 211.4 lb

## 2024-05-11 DIAGNOSIS — R809 Proteinuria, unspecified: Secondary | ICD-10-CM

## 2024-05-11 DIAGNOSIS — E1169 Type 2 diabetes mellitus with other specified complication: Secondary | ICD-10-CM | POA: Diagnosis not present

## 2024-05-11 DIAGNOSIS — I10 Essential (primary) hypertension: Secondary | ICD-10-CM

## 2024-05-11 DIAGNOSIS — E1129 Type 2 diabetes mellitus with other diabetic kidney complication: Secondary | ICD-10-CM

## 2024-05-11 DIAGNOSIS — Z7984 Long term (current) use of oral hypoglycemic drugs: Secondary | ICD-10-CM

## 2024-05-11 DIAGNOSIS — M109 Gout, unspecified: Secondary | ICD-10-CM

## 2024-05-11 DIAGNOSIS — E785 Hyperlipidemia, unspecified: Secondary | ICD-10-CM

## 2024-05-11 LAB — POCT GLYCOSYLATED HEMOGLOBIN (HGB A1C): Hemoglobin A1C: 10.4 % — AB (ref 4.0–5.6)

## 2024-05-11 LAB — GLUCOSE, CAPILLARY: Glucose-Capillary: 261 mg/dL — ABNORMAL HIGH (ref 70–99)

## 2024-05-11 MED ORDER — EMPAGLIFLOZIN 10 MG PO TABS: 10.0000 mg | ORAL_TABLET | Freq: Every day | ORAL | 0 refills | Status: DC

## 2024-05-11 MED ORDER — ATORVASTATIN CALCIUM 40 MG PO TABS: 40.0000 mg | ORAL_TABLET | Freq: Every day | ORAL | 3 refills | Status: AC

## 2024-05-11 MED ORDER — AMLODIPINE-OLMESARTAN 10-40 MG PO TABS: 1.0000 | ORAL_TABLET | Freq: Every day | ORAL | 3 refills | Status: AC

## 2024-05-11 MED ORDER — ALLOPURINOL 100 MG PO TABS: 100.0000 mg | ORAL_TABLET | Freq: Every day | ORAL | 3 refills | Status: DC

## 2024-05-11 MED ORDER — METFORMIN HCL 1000 MG PO TABS: 1000.0000 mg | ORAL_TABLET | Freq: Two times a day (BID) | ORAL | 3 refills | Status: AC

## 2024-05-11 NOTE — Progress Notes (Unsigned)
 CC: Follow-up for hypertension and diabetes.  HPI:  [name] is a 63 y.o. male living with a history stated below and presents today for follow-up on hypertension and diabetes.  Please see problem based assessment and plan for additional details.  Patient was last seen on 01/06/2024 resident [facility].  Past Medical History:  Diagnosis Date   Allergy    Arthritis    Chronic lower back pain    COVID-19 virus infection 01/10/2021   Diabetes mellitus without complication (HCC)    GERD (gastroesophageal reflux disease)    Gout    Hyperlipidemia    Hyperosmolar hyperglycemic state (HHS) (HCC) 01/10/2021   Hypertension    Seasonal allergies    Tobacco use disorder 08/31/2013    Current Outpatient Medications on File Prior to Visit  Medication Sig Dispense Refill   Albuterol  Sulfate (PROAIR  RESPICLICK) 108 (90 Base) MCG/ACT AEPB Inhale 2 puffs into the lungs every 6 (six) hours as needed. (Patient not taking: Reported on 08/25/2023) 1 each 0   benzonatate  (TESSALON  PERLES) 100 MG capsule Take 1 capsule (100 mg total) by mouth every 6 (six) hours as needed for cough. 30 capsule 1   Blood Glucose Monitoring Suppl (RELION CONFIRM GLUCOSE MONITOR) W/DEVICE KIT 1 Units by Does not apply route once. 1 kit 0   glucose blood (FREESTYLE TEST STRIPS) test strip Use as instructed (Patient not taking: Reported on 08/25/2023) 100 each 12   indomethacin  (INDOCIN ) 50 MG capsule TAKE 1 CAPSULE BY MOUTH TWICE DAILY AS NEEDED 30 capsule 0   No current facility-administered medications on file prior to visit.   Review of Systems: ROS negative except for what is noted on the assessment and plan.  Vitals:   05/11/24 1535  BP: (!) 158/103  Pulse: (!) 110  Temp: (!) 97.5 F (36.4 C)  TempSrc: Oral  Weight: 211 lb 6.4 oz (95.9 kg)  Height: 5\' 10"  (1.778 m)    Last hemoglobin A1c Lab Results  Component Value Date   HGBA1C 10.4 (A) 05/11/2024     BP Readings from Last 3 Encounters:   05/11/24 (!) 158/103  01/06/24 125/62  08/25/23 (!) 129/94     Physical Exam: Constitutional: NAD Cardiovascular: regular rate and rhythm, no m/r/g Pulmonary/Chest: normal work of breathing on room air, lungs clear to auscultation bilaterally Abdominal: soft, non-tender, non-distended MSK: 2+ DP/PT pulses bilateral.  Normal sensation. Assessment & Plan:   Patient discussed with Dr. Broadus Canes  Type 2 diabetes mellitus with proteinuria (HCC) Home sugar monitoring the past 3 weeks has been greater than 300.  A1c  7.2 >>10.4.  Elevated A1c in the setting of medication noncompliance.  Endorses polyuria and polydipsia.  Denies numbness, tingling sensation or new ulcers.  Home regimen includes metformin  1000 mg twice daily and Jardiance  10 mg.  He has not recently been screened for diabetic retinopathy, will place an order for this.   - Metformin  1000 mg twice daily. - Jardiance  10 mg. - Ophthalmology referral placed. - Telephone visit in a week to discuss diabetes and hypertension.  Essential hypertension Bp 158/103. Blood pressure elevated as result of medication nonadherence.  Per medication dispense history, and patient report, he has been without amlodipine -olmesartan  for about 6 months.Patient forgot to pick this medicine up from the pharmacy, last refilled in December 2024.  He endorses remittent palpitation.  No chest pain or shortness of breath. I discussed adherence measures including making use of pillbox, and providing 36-month supply, with additional refills.  Patient advised to  keep a blood pressure log, and schedule a 1 week telephone visit to go over the log. -Restart amlodipine -losartan  10-40 mg. - Will check BMP.   Hyperlipidemia associated with type 2 diabetes mellitus (HCC) Nonadherent with atorvastatin  40 mg.  Encouraged adherence, recheck lipids in 6 months.   Marni Sins, MD [facility] Internal Medicine, PGY-1 Pager: [phone] Date 05/12/2024 Time 8:42 AM

## 2024-05-11 NOTE — Patient Instructions (Signed)
 It was a pleasure taking care of you today!    1.  Your blood pressure has been elevated, as a result of not taking her medicines.  I have sent refills of your blood pressure medicine amlodipine -olmesartan  10-40mg .  2.  Your A1c is elevated today.  Please take metformin  1000 mg twice a day. Also take Jardiance  10 mg once a day.  3.  Please schedule a telephone visit with me next week.  Keep a log of your blood pressure and your sugars.  I have ordered the following labs for you:  Lab Orders         Glucose, capillary         BMP8+Anion Gap         POC Hbg A1C         Follow up: 3 months   Should you have any questions or concerns please call the internal medicine clinic at [phone].     Marni Sins, MD  [facility]

## 2024-05-12 LAB — BMP8+ANION GAP
Anion Gap: 18 mmol/L (ref 10.0–18.0)
BUN/Creatinine Ratio: 23 (ref 10–24)
BUN: 28 mg/dL — ABNORMAL HIGH (ref 8–27)
CO2: 20 mmol/L (ref 20–29)
Calcium: 10.9 mg/dL — ABNORMAL HIGH (ref 8.6–10.2)
Chloride: 95 mmol/L — ABNORMAL LOW (ref 96–106)
Creatinine, Ser: 1.2 mg/dL (ref 0.76–1.27)
Glucose: 239 mg/dL — ABNORMAL HIGH (ref 70–99)
Potassium: 4.5 mmol/L (ref 3.5–5.2)
Sodium: 133 mmol/L — ABNORMAL LOW (ref 134–144)
eGFR: 68 mL/min/{1.73_m2} (ref >59)

## 2024-05-12 NOTE — Assessment & Plan Note (Signed)
 Home sugar monitoring the past 3 weeks has been greater than 300.  A1c  7.2 >>10.4.  Elevated A1c in the setting of medication noncompliance.  Endorses polyuria and polydipsia.  Denies numbness, tingling sensation or new ulcers.  Home regimen includes metformin  1000 mg twice daily and Jardiance  10 mg.  He has not recently been screened for diabetic retinopathy, will place an order for this.   - Metformin  1000 mg twice daily. - Jardiance  10 mg. - Ophthalmology referral placed. - Telephone visit in a week to discuss diabetes and hypertension.

## 2024-05-12 NOTE — Assessment & Plan Note (Signed)
 Nonadherent with atorvastatin  40 mg.  Encouraged adherence, recheck lipids in 6 months.

## 2024-05-12 NOTE — Assessment & Plan Note (Signed)
 Bp 158/103. Blood pressure elevated as result of medication nonadherence.  Per medication dispense history, and patient report, he has been without amlodipine -olmesartan  for about 6 months.Patient forgot to pick this medicine up from the pharmacy, last refilled in December 2024.  He endorses remittent palpitation.  No chest pain or shortness of breath. I discussed adherence measures including making use of pillbox, and providing 42-month supply, with additional refills.  Patient advised to keep a blood pressure log, and schedule a 1 week telephone visit to go over the log. -Restart amlodipine -losartan  10-40 mg. - Will check BMP.

## 2024-05-13 ENCOUNTER — Ambulatory Visit: Payer: Self-pay | Admitting: Student

## 2024-05-13 NOTE — Progress Notes (Signed)
 BMP with mild hyponatremia and mild hypercalcemia.  He is asymptomatic.  Will repeat CMP in 3 months.

## 2024-05-26 NOTE — Progress Notes (Signed)
 Internal Medicine Clinic Attending  Case discussed with the resident at the time of the visit.  We reviewed the resident's history and exam and pertinent patient test results.  I agree with the assessment, diagnosis, and plan of care documented in the resident's note.

## 2024-07-15 ENCOUNTER — Ambulatory Visit (HOSPITAL_COMMUNITY)
Admission: RE | Admit: 2024-07-15 | Discharge: 2024-07-15 | Disposition: A | Discharge status: Home / Self Care | Source: Ambulatory Visit | Attending: Family Medicine | Admitting: Family Medicine

## 2024-07-15 ENCOUNTER — Ambulatory Visit: Payer: Self-pay

## 2024-07-15 ENCOUNTER — Ambulatory Visit: Admitting: Student

## 2024-07-15 VITALS — BP 144/83 | HR 101 | Temp 98.2°F | Ht 70.0 in | Wt 219.4 lb

## 2024-07-15 DIAGNOSIS — M25572 Pain in left ankle and joints of left foot: Secondary | ICD-10-CM | POA: Insufficient documentation

## 2024-07-15 NOTE — Patient Instructions (Signed)
 Thank you so much for coming to the clinic today!   I have put in for the xray, we should be able to get it done across the street at the hospital In the meanwhile, keep using tylenol  and try putting some ice on it for the swelling  If the pain gets worse or you notice the area becoming red please give us  a call  If you have any questions please feel free to the call the clinic at anytime at [phone]. It was a pleasure seeing you!  Best, Dr. Delrose Rohwer

## 2024-07-15 NOTE — Telephone Encounter (Signed)
 FYI Only or Action Required?: FYI only for provider.  Patient was last seen in primary care on 05/11/2024 by Celestina Czar, MD.  Called Nurse Triage reporting Ankle Injury.  Symptoms began yesterday.  Interventions attempted: OTC medications: tylenol .  Symptoms are: gradually worsening.  Triage Disposition: See Physician Within 24 Hours  Patient/caregiver understands and will follow disposition?: Yes  Reason for Disposition  [1] MODERATE pain (e.g., interferes with normal activities, limping) AND [2] high-risk adult (e.g., age > 60 years, osteoporosis, chronic steroid use)  Answer Assessment - Initial Assessment Questions 1. MECHANISM: How did the injury happen? (e.g., twisting injury, direct blow)      Believes he twisted his ankle at work 2. ONSET: When did the injury happen? (e.g., minutes or hours ago)      07/14/24 3. LOCATION: Where is the injury located?      Left ankle 4. APPEARANCE of INJURY: What does the injury look like?      Swollen 5. WEIGHT-BEARING: Can you put weight on that foot? Can you walk (four steps or more)?       Weight bearing with pain 7. PAIN: Is there pain? If Yes, ask: How bad is the pain?  What does it keep you from doing? (Scale 0-10; or none, mild, moderate, severe)     9/10  Protocols used: Ankle Injury-A-AH

## 2024-07-15 NOTE — Telephone Encounter (Signed)
 Copied from CRM [id number]. Topic: Clinical - Red Word Triage >> Jul 15, 2024  2:44 PM Carrielelia G wrote: Red Word that prompted transfer to Nurse Triage: patient experiencing left ankle, pain (9),  swollen , difficulty walking.SABRA

## 2024-07-16 DIAGNOSIS — M25672 Stiffness of left ankle, not elsewhere classified: Secondary | ICD-10-CM | POA: Insufficient documentation

## 2024-07-16 DIAGNOSIS — M25572 Pain in left ankle and joints of left foot: Secondary | ICD-10-CM | POA: Insufficient documentation

## 2024-07-16 HISTORY — DX: Stiffness of left ankle, not elsewhere classified: M25.672

## 2024-07-16 NOTE — Assessment & Plan Note (Signed)
 Pt presents for an acute visit for left ankle pain. He states that he was at work two days ago when he accidentally inverted his ankle while walking. The pain started the day after, along with swelling. He has been unable to put much weight on it, and has been trying things like tylenol  to help with the pain. On my exam, his ankle is not erythematous, tenderness to palpation over posterior edge of lateral malleolus of left ankle. Mildly decreased range of motion of left ankle, swelling present as well. Unable to put much weight on left ankle  Plan:  - Left ankle X-Ray  - Supportive care with ice and rest

## 2024-07-16 NOTE — Progress Notes (Signed)
 CC: Left ankle pain  HPI:  [name] is a 63 y.o. male living with a history stated below and presents today for left ankle pain. Please see problem based assessment and plan for additional details.  Past Medical History:  Diagnosis Date   Allergy    Arthritis    Chronic lower back pain    COVID-19 virus infection 01/10/2021   Diabetes mellitus without complication (HCC)    GERD (gastroesophageal reflux disease)    Gout    Hyperlipidemia    Hyperosmolar hyperglycemic state (HHS) (HCC) 01/10/2021   Hypertension    Seasonal allergies    Tobacco use disorder 08/31/2013    Current Outpatient Medications on File Prior to Visit  Medication Sig Dispense Refill   Albuterol  Sulfate (PROAIR  RESPICLICK) 108 (90 Base) MCG/ACT AEPB Inhale 2 puffs into the lungs every 6 (six) hours as needed. (Patient not taking: Reported on 08/25/2023) 1 each 0   allopurinol  (ZYLOPRIM ) 100 MG tablet Take 1 tablet (100 mg total) by mouth daily. 90 tablet 3   amLODipine -olmesartan  (AZOR ) 10-40 MG tablet Take 1 tablet by mouth daily. 90 tablet 3   atorvastatin  (LIPITOR) 40 MG tablet Take 1 tablet (40 mg total) by mouth daily. 90 tablet 3   benzonatate  (TESSALON  PERLES) 100 MG capsule Take 1 capsule (100 mg total) by mouth every 6 (six) hours as needed for cough. 30 capsule 1   Blood Glucose Monitoring Suppl (RELION CONFIRM GLUCOSE MONITOR) W/DEVICE KIT 1 Units by Does not apply route once. 1 kit 0   empagliflozin  (JARDIANCE ) 10 MG TABS tablet Take 1 tablet (10 mg total) by mouth daily before breakfast. 90 tablet 0   glucose blood (FREESTYLE TEST STRIPS) test strip Use as instructed (Patient not taking: Reported on 08/25/2023) 100 each 12   indomethacin  (INDOCIN ) 50 MG capsule TAKE 1 CAPSULE BY MOUTH TWICE DAILY AS NEEDED 30 capsule 0   metFORMIN  (GLUCOPHAGE ) 1000 MG tablet Take 1 tablet (1,000 mg total) by mouth 2 (two) times daily with a meal. 180 tablet 3   No current facility-administered medications  on file prior to visit.    Family History  Problem Relation Age of Onset   Heart disease Mother    Diabetes Mother    Liver disease Father    Diabetes Sister    Colon cancer Paternal Aunt    Colon polyps Neg Hx    Esophageal cancer Neg Hx    Stomach cancer Neg Hx    Rectal cancer Neg Hx     Social History   Socioeconomic History   Marital status: Married    Spouse name: Not on file   Number of children: Not on file   Years of education: 8   Highest education level: Not on file  Occupational History   Occupation: employed    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  Tobacco Use   Smoking status: Former    Current packs/day: 0.00    Average packs/day: 0.1 packs/day for 40.0 years (4.0 ttl pk-yrs)    Types: Cigarettes    Start date: 12/31/1979    Quit date: 12/31/2019    Years since quitting: 4.5   Smokeless tobacco: Former   Tobacco comments:    05/08/2018 used chewing tobacco years ago  Vaping Use   Vaping status: Never Used  Substance and Sexual Activity   Alcohol use: Yes    Alcohol/week: 33.0 standard drinks of alcohol    Types: 12 Cans of beer, 21  Shots of liquor per week    Comment: 12 pack beer & 2 pints of vodka   Drug use: No    Comment: Used cocaine in the 1990s; quit cold [place] (05/07/2018)   Sexual activity: Not Currently  Other Topics Concern   Not on file  Social History Narrative   Patient lives in [place] with his wife and 3 daughters.   He has have a total of 6 daughters.   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   Social Drivers of Corporate investment banker Strain: Low Risk  (06/10/2023)   Overall Financial Resource Strain (CARDIA)    Difficulty of Paying Living Expenses: Not hard at all  Food Insecurity: No Food Insecurity (06/10/2023)   Hunger Vital Sign    Worried About Running Out of Food in the Last Year: Never true    Ran Out of Food in the Last Year: Never true  Transportation  Needs: No Transportation Needs (06/10/2023)   PRAPARE - Administrator, Civil Service (Medical): No    Lack of Transportation (Non-Medical): No  Physical Activity: Sufficiently Active (06/10/2023)   Exercise Vital Sign    Days of Exercise per Week: 4 days    Minutes of Exercise per Session: 70 min  Stress: No Stress Concern Present (06/10/2023)   [employer] of Occupational Health - Occupational Stress Questionnaire    Feeling of Stress : Not at all  Social Connections: Moderately Integrated (06/10/2023)   Social Connection and Isolation Panel    Frequency of Communication with Friends and Family: More than three times a week    Frequency of Social Gatherings with Friends and Family: More than three times a week    Attends Religious Services: More than 4 times per year    Active Member of [employer] or Organizations: No    Attends Banker Meetings: Never    Marital Status: Married  Catering manager Violence: Not At Risk (06/10/2023)   Humiliation, Afraid, Rape, and Kick questionnaire    Fear of Current or Ex-Partner: No    Emotionally Abused: No    Physically Abused: No    Sexually Abused: No    Review of Systems: ROS negative except for what is noted on the assessment and plan.  Vitals:   07/15/24 1549  BP: (!) 144/83  Pulse: (!) 101  Temp: 98.2 F (36.8 C)  TempSrc: Oral  SpO2: 96%  Weight: 219 lb 6.4 oz (99.5 kg)  Height: 5' 10 (1.778 m)    Physical Exam: Constitutional: well-appearing male  in no acute distress HENT: normocephalic atraumatic, mucous membranes moist Eyes: conjunctiva non-erythematous Neck: supple Cardiovascular: regular rate and rhythm, no m/r/g Pulmonary/Chest: normal work of breathing on room air, lungs clear to auscultation bilaterally Abdominal: soft, non-tender, non-distended MSK: normal bulk and tone, tenderness to palpation over posterior edge of lateral malleolus of left ankle. Mildly decreased range of motion of left  ankle, swelling present as well. Unable to put much weight on left ankle   Assessment & Plan:   Acute left ankle pain Pt presents for an acute visit for left ankle pain. He states that he was at work two days ago when he accidentally inverted his ankle while walking. The pain started the day after, along with swelling. He has been unable to put much weight on it, and has been trying things like tylenol  to help with the pain. On my exam, his  ankle is not erythematous, tenderness to palpation over posterior edge of lateral malleolus of left ankle. Mildly decreased range of motion of left ankle, swelling present as well. Unable to put much weight on left ankle  Plan:  - Left ankle X-Ray  - Supportive care with ice and rest   Patient discussed with Dr. Jeanelle Roetta Chars, M.D. [facility], PGY-3 Pager: [phone] Date 07/16/2024 Time 8:08 AM

## 2024-07-19 ENCOUNTER — Ambulatory Visit: Payer: Self-pay | Admitting: Student

## 2024-07-19 NOTE — Addendum Note (Signed)
 Addended by: Liev Brockbank on: 07/19/2024 03:51 PM   Modules accepted: Orders

## 2024-07-20 NOTE — Progress Notes (Signed)
 Internal Medicine Clinic Attending  Case discussed with the resident at the time of the visit.  We reviewed the resident's history and exam and pertinent patient test results.  I agree with the assessment, diagnosis, and plan of care documented in the resident's note.

## 2024-07-26 ENCOUNTER — Ambulatory Visit: Admitting: Physical Therapy

## 2024-07-26 NOTE — Therapy (Unsigned)
 OUTPATIENT PHYSICAL THERAPY LOWER EXTREMITY EVALUATION   Patient Name: [name] MRN: [id number] DOB:August 26, 1961, 63 y.o., male Today's Date: 07/26/2024  END OF SESSION:   Past Medical History:  Diagnosis Date   Allergy    Arthritis    Chronic lower back pain    COVID-19 virus infection 01/10/2021   Diabetes mellitus without complication (HCC)    GERD (gastroesophageal reflux disease)    Gout    Hyperlipidemia    Hyperosmolar hyperglycemic state (HHS) (HCC) 01/10/2021   Hypertension    Seasonal allergies    Tobacco use disorder 08/31/2013   Past Surgical History:  Procedure Laterality Date   ANKLE ARTHROSCOPY W/ OPEN REPAIR Bilateral 2008   ANKLE SURGERY     for torn ligaments at [facility]    DENTAL SURGERY     TOTAL HIP ARTHROPLASTY Right 05/07/2018   Procedure: RIGHT TOTAL HIP ARTHROPLASTY ANTERIOR APPROACH;  Surgeon: Jerri Kay HERO, MD;  Location: [facility] OR;  Service: Orthopedics;  Laterality: Right;   Patient Active Problem List   Diagnosis Date Noted   Acute left ankle pain 07/16/2024   Healthcare maintenance 06/10/2023   Chest wall pain 03/04/2023   Thyroid  nodule 07/09/2022   Pancreatic abnormality 07/09/2022   Encounter for screening for lung cancer 04/02/2022   Vitamin D deficiency 04/01/2022   Chronic lower extremity pain (2ry area of Pain) (Bilateral) (L>R) 10/01/2021   Chronic neck pain (Posterior) (Bilateral) (R>L) 03/01/2021   DDD (degenerative disc disease), lumbar 05/25/2020   Asthma in adult 08/08/2017   Alcohol use disorder 07/19/2016   Hyperlipidemia associated with type 2 diabetes mellitus (HCC) 07/19/2016   Chronic low back pain (1ry area of Pain) (Bilateral) (L>R) w/ sciatica (Left) 09/29/2014   Type 2 diabetes mellitus with proteinuria (HCC) 05/25/2014   Tobacco use disorder 08/31/2013   Gout 04/30/2013   Essential hypertension 04/30/2013    PCP: Nooruddin, Saad, MD  REFERRING PROVIDER: Onesimo Claude, MD  REFERRING DIAG: Acute left ankle  pain [M25.572]   Rationale for Evaluation and Treatment: Rehabilitation  THERAPY DIAG:  No diagnosis found.  PERTINENT HISTORY: HTN, DM2,  WEIGHT BEARING RESTRICTIONS: {Yes ***/No:24003}  FALLS:  Has patient fallen in last 6 months? {fallsyesno:27318}  LIVING ENVIRONMENT: Lives with: {[facility] lives with:25569::lives with their family} Lives in: {Lives in:25570} Stairs: {opstairs:27293} Has following equipment at home: {Assistive devices:23999}  OCCUPATION: ***   PRECAUTIONS: {Therapy precautions:24002} ---------------------------------------------------------------------------------------------  SUBJECTIVE:   SUBJECTIVE STATEMENT: Eval statement 07/26/2024: ***  RED FLAGS: {PT Red Flags:29287}   PLOF: {PLOF:24004}  PATIENT GOALS: ***  NEXT MD VISIT: *** ---------------------------------------------------------------------------------------------  OBJECTIVE:  Note: Objective measures were completed at Evaluation unless otherwise noted.  DIAGNOSTIC FINDINGS: ***  PATIENT SURVEYS:  {rehab surveys:24030}  COGNITION: Overall cognitive status: {cognition:24006}     SENSATION: {sensation:27233}  EDEMA:  ***  MUSCLE LENGTH: Hamstrings: Right *** deg; Left *** deg Debby test: Right *** deg; Left *** deg  POSTURE: {posture:25561}  PALPATION: ***  LOWER EXTREMITY ROM:  {AROM/PROM:27142} ROM Right eval Left eval  Hip flexion    Hip extension    Hip abduction    Hip adduction    Hip internal rotation    Hip external rotation    Knee flexion    Knee extension    Ankle dorsiflexion    Ankle plantarflexion    Ankle inversion    Ankle eversion     (Blank rows = not tested)  ! Indicates pain with testing  LOWER EXTREMITY MMT:  MMT Right eval Left eval  Hip flexion    Hip extension    Hip abduction    Hip adduction    Hip internal rotation    Hip external rotation    Knee flexion    Knee extension    Ankle dorsiflexion    Ankle  plantarflexion    Ankle inversion    Ankle eversion     (Blank rows = not tested)  ! Indicates pain with testing  LOWER EXTREMITY SPECIAL TESTS:  {LEspecialtests:26242}  FUNCTIONAL TESTS:  {Functional tests:24029}  GAIT: Distance walked: *** Assistive device utilized: {Assistive devices:23999} Level of assistance: {Levels of assistance:24026} Comments: ***                                                                                                                                [facility] Adult PT Treatment:                                                DATE: 07/26/2024  Therapeutic Exercise: *** Manual Therapy: *** Neuromuscular re-ed: *** Therapeutic Activity: *** Modalities: *** Self Care: Pt education, detailed below POC discussion    PATIENT EDUCATION:  Education details: Pt received education regarding HEP performance, ADL performance, functional activity tolerance, impairment education, appropriate performance of therapeutic activities.*** Person educated: {Person educated:25204} Education method: {Education Method:25205} Education comprehension: {Education Comprehension:25206}  HOME EXERCISE PROGRAM: *** ---------------------------------------------------------------------------------------------  ASSESSMENT:  CLINICAL IMPRESSION: Eval impression (07/26/2024): Pt. attended today's physical therapy session for evaluation of ***. Pt has complaints of ***. Pt has notable deficits with ***.  Signs and symptoms are concurrent with ***. Pt would benefit from therapeutic focus on ***.  Treatment performed today focused on *** Pt demonstrated *** understanding of education provided. required *** cues and *** assistance for appropriate performance with today's activities. Pt requires the intervention of skilled outpatient physical therapy to address the aforementioned deficits and progress towards a functional level in line with therapeutic goals.   OBJECTIVE  IMPAIRMENTS: {opptimpairments:25111}.   ACTIVITY LIMITATIONS: {activitylimitations:27494}  PARTICIPATION LIMITATIONS: {participationrestrictions:25113}  PERSONAL FACTORS: {Personal factors:25162} are also affecting patient's functional outcome.   REHAB POTENTIAL: {rehabpotential:25112}  CLINICAL DECISION MAKING: {clinical decision making:25114}  EVALUATION COMPLEXITY: {Evaluation complexity:25115}   GOALS: Goals reviewed with patient? YES  SHORT TERM GOALS: Target date: *** Pt will be independent with administered HEP to demonstrate the competency necessary for long term managemnet of symptoms at home.  Baseline: Goal status: INITIAL  2.  *** Baseline:  Goal status: INITIAL  3.  *** Baseline:  Goal status: INITIAL  4.  *** Baseline:  Goal status: INITIAL  5.  *** Baseline:  Goal status: INITIAL  6.  *** Baseline:  Goal status: INITIAL  LONG TERM GOALS: Target date: ***  Pt. Will achieve a LEFS score of *** as to demonstrate improvement in self-perceived functional  ability with daily activities.  Baseline:  Goal status: INITIAL  2.  Pt will improve Global Hip/knee strength to a ***/5 to demonstrate improvement in strength for quality of motion and activity performance.  Baseline:  Goal status: INITIAL  3.  *** Baseline:  Goal status: INITIAL  4.  *** Baseline:  Goal status: INITIAL  5.  *** Baseline:  Goal status: INITIAL  6.  *** Baseline:  Goal status: INITIAL  --------------------------------------------------------------------------------------------- PLAN:  PT FREQUENCY: 1-2x/week  PT DURATION: 6 weeks  PLANNED INTERVENTIONS: 97110-Therapeutic exercises, 97530- Therapeutic activity, 97112- Neuromuscular re-education, 97535- Self Care, 02859- Manual therapy, [id number]- Gait training, Patient/Family education, Balance training, Stair training, Taping, Joint mobilization, DME instructions, Cryotherapy, and Moist heat  PLAN FOR NEXT SESSION:  Review HEP, Begin POC as detailed in the assessment   Mabel Kiang, PT, DPT 07/26/2024, 3:33 PM

## 2024-07-29 ENCOUNTER — Telehealth: Payer: Self-pay

## 2024-07-29 NOTE — Telephone Encounter (Signed)
 I contacted pt to let him know that I received disability paperwork from [employer], no answer. I left message to call the clinic back.

## 2024-08-05 ENCOUNTER — Other Ambulatory Visit: Payer: Self-pay | Admitting: *Deleted

## 2024-08-05 ENCOUNTER — Ambulatory Visit: Admitting: Student

## 2024-08-05 ENCOUNTER — Telehealth: Payer: Self-pay

## 2024-08-05 VITALS — BP 145/85 | HR 99 | Temp 98.2°F | Ht 70.0 in | Wt 223.6 lb

## 2024-08-05 DIAGNOSIS — R809 Proteinuria, unspecified: Secondary | ICD-10-CM | POA: Diagnosis not present

## 2024-08-05 DIAGNOSIS — E1129 Type 2 diabetes mellitus with other diabetic kidney complication: Secondary | ICD-10-CM | POA: Diagnosis not present

## 2024-08-05 DIAGNOSIS — M25672 Stiffness of left ankle, not elsewhere classified: Secondary | ICD-10-CM | POA: Diagnosis not present

## 2024-08-05 DIAGNOSIS — Z7984 Long term (current) use of oral hypoglycemic drugs: Secondary | ICD-10-CM

## 2024-08-05 DIAGNOSIS — I10 Essential (primary) hypertension: Secondary | ICD-10-CM

## 2024-08-05 DIAGNOSIS — M1A49X0 Other secondary chronic gout, multiple sites, without tophus (tophi): Secondary | ICD-10-CM

## 2024-08-05 MED ORDER — INDOMETHACIN 50 MG PO CAPS: ORAL_CAPSULE | ORAL | 1 refills | Status: AC

## 2024-08-05 NOTE — Progress Notes (Signed)
  Patient name: [name] Date of birth: 01-May-1961 Date of visit: 08/05/24  Subjective   Chief concern: left ankle pain   Left ankle is still swollen and painful. He notes increased swelling and pain after prolonged periods walking or standing. Joint mobility is limited. No warmth or redness. Requests that we fill out disability paperwork for paid leave from work.  ROS per above.  Current Outpatient Medications  Medication Instructions   Albuterol  Sulfate (PROAIR  RESPICLICK) 108 (90 Base) MCG/ACT AEPB 2 puffs, Inhalation, Every 6 hours PRN   allopurinol  (ZYLOPRIM ) 100 mg, Oral, Daily   amLODipine -olmesartan  (AZOR ) 10-40 MG tablet 1 tablet, Oral, Daily   atorvastatin  (LIPITOR) 40 mg, Oral, Daily   benzonatate  (TESSALON  PERLES) 100 mg, Oral, Every 6 hours PRN   empagliflozin  (JARDIANCE ) 10 mg, Oral, Daily before breakfast   glucose blood (FREESTYLE TEST STRIPS) test strip Use as instructed   indomethacin  (INDOCIN ) 50 MG capsule TAKE 1 CAPSULE BY MOUTH TWICE DAILY AS NEEDED   metFORMIN  (GLUCOPHAGE ) 1,000 mg, Oral, 2 times daily with meals   ReliOn Confirm Glucose Monitor 1 Units, Does not apply,  Once     Objective  Today's Vitals   08/05/24 1421 08/05/24 1510  BP: (!) 147/84 (!) 145/85  Pulse: (!) 101 99  Temp: 98.2 F (36.8 C)   TempSrc: Oral   SpO2: 96%   Weight: 223 lb 9.6 oz (101.4 kg)   Height: 5' 10 (1.778 m)   Body mass index is 32.08 kg/m.   Physical Exam Constitutional:      Appearance: Normal appearance.  Cardiovascular:     Rate and Rhythm: Normal rate and regular rhythm.     Comments: Normal femoral and DP pulses bilaterally, no femoral bruits Pulmonary:     Effort: Pulmonary effort is normal. No respiratory distress.  Musculoskeletal:     Comments: Mild effusion around left ankle joint with some tenderness around medial and lateral malleoli, limited range of motion due to pain and stiffness  Skin:    General: Skin is warm and dry.  Neurological:      Mental Status: He is alert.     Cranial Nerves: No facial asymmetry.  Psychiatric:        Mood and Affect: Affect normal.        Speech: Speech normal.        Behavior: Behavior normal.      Assessment & Plan   Stiffness of ankle joint, left Assessment & Plan: Slow to recover from ligamentous strain or sprain of left ankle. Pain and stiffness ongoing. Declined PT services due to cost. Continue supportive care including rest, ice, compression, and elevation. Tylenol  is safe for pain control, 1,000 mg every 6 hours as needed. Disability paperwork filled out today.   Essential hypertension Assessment & Plan: Poor control. No changes today. Avoid salt, especially processed meats like bologna. Track blood pressure at home, return in 2 weeks for follow-up.   Type 2 diabetes mellitus with proteinuria (HCC) Assessment & Plan: Poor control. Follow-up in 2 weeks for diabetes and hypertension.     Return in about 2 weeks (around 08/19/2024) for hypertension, diabetes, and weight loss.  Ozell Kung MD 08/05/2024, 4:58 PM

## 2024-08-05 NOTE — Telephone Encounter (Signed)
 Discussed refill request with DrMclendon (saw pt today)

## 2024-08-05 NOTE — Assessment & Plan Note (Signed)
 Poor control. No changes today. Avoid salt, especially processed meats like bologna. Track blood pressure at home, return in 2 weeks for follow-up.

## 2024-08-05 NOTE — Assessment & Plan Note (Signed)
 Poor control. Follow-up in 2 weeks for diabetes and hypertension.

## 2024-08-05 NOTE — Telephone Encounter (Signed)
 SDOH paperwork was given to the patient during his appointment. Patient refused to complete the paperwork.

## 2024-08-05 NOTE — Patient Instructions (Addendum)
 Return in about 2 weeks (around 08/19/2024) for hypertension, diabetes, and weight loss.  Check your blood pressure at home.  Avoid processed foods.  Remember to bring all of the medications that you take (including over the counter medications and supplements) with you to every clinic visit.  This after visit summary is an important review of tests, referrals, and medication changes that were discussed during your visit. If you have questions or concerns, call [phone]. Outside of clinic business hours, call the main hospital at [phone] and ask the operator for the on-call internal medicine resident.   Ozell Kung MD 08/05/2024, 3:22 PM

## 2024-08-05 NOTE — Assessment & Plan Note (Signed)
 Slow to recover from ligamentous strain or sprain of left ankle. Pain and stiffness ongoing. Declined PT services due to cost. Continue supportive care including rest, ice, compression, and elevation. Tylenol  is safe for pain control, 1,000 mg every 6 hours as needed. Disability paperwork filled out today.

## 2024-08-06 NOTE — Progress Notes (Signed)
 Internal Medicine Clinic Attending  Case discussed with the resident at the time of the visit.  We reviewed the resident's history and exam and pertinent patient test results.  I agree with the assessment, diagnosis, and plan of care documented in the resident's note.

## 2024-08-19 ENCOUNTER — Encounter: Payer: Self-pay | Admitting: Student

## 2024-08-19 ENCOUNTER — Ambulatory Visit: Admitting: Student

## 2024-08-19 VITALS — BP 128/88 | HR 98 | Temp 97.7°F | Ht 70.0 in | Wt 224.0 lb

## 2024-08-19 DIAGNOSIS — R0789 Other chest pain: Secondary | ICD-10-CM

## 2024-08-19 DIAGNOSIS — M25672 Stiffness of left ankle, not elsewhere classified: Secondary | ICD-10-CM

## 2024-08-19 DIAGNOSIS — I10 Essential (primary) hypertension: Secondary | ICD-10-CM | POA: Diagnosis not present

## 2024-08-19 DIAGNOSIS — Z7984 Long term (current) use of oral hypoglycemic drugs: Secondary | ICD-10-CM

## 2024-08-19 DIAGNOSIS — R809 Proteinuria, unspecified: Secondary | ICD-10-CM | POA: Diagnosis not present

## 2024-08-19 DIAGNOSIS — M25562 Pain in left knee: Secondary | ICD-10-CM

## 2024-08-19 DIAGNOSIS — E1129 Type 2 diabetes mellitus with other diabetic kidney complication: Secondary | ICD-10-CM

## 2024-08-19 DIAGNOSIS — M25569 Pain in unspecified knee: Secondary | ICD-10-CM | POA: Insufficient documentation

## 2024-08-19 DIAGNOSIS — M25561 Pain in right knee: Secondary | ICD-10-CM

## 2024-08-19 LAB — POCT GLYCOSYLATED HEMOGLOBIN (HGB A1C): HbA1c, POC (controlled diabetic range): 6 % (ref 0.0–7.0)

## 2024-08-19 LAB — GLUCOSE, CAPILLARY: Glucose-Capillary: 105 mg/dL — ABNORMAL HIGH (ref 70–99)

## 2024-08-19 NOTE — Assessment & Plan Note (Signed)
 Resolved

## 2024-08-19 NOTE — Assessment & Plan Note (Signed)
 BP Readings from Last 3 Encounters:  08/19/24 128/88  08/05/24 (!) 145/85  07/15/24 (!) 144/83   Chronic, at goal. Checking blood pressure at home, seeing ~130-140 systolic. Initially higher in clinic today, repeated with home BP cuff and ~125-30 systolic, confirmed by side-by-side testing again with clinic BP cuff. Continue amlodipine  olmesartan  10-40 mg. Avoid processed foods, work on weight loss.

## 2024-08-19 NOTE — Patient Instructions (Signed)
 Remember to bring all of the medications that you take (including over the counter medications and supplements) with you to every clinic visit.  This after visit summary is an important review of tests, referrals, and medication changes that were discussed during your visit. If you have questions or concerns, call [phone]. Outside of clinic business hours, call the main hospital at [phone] and ask the operator for the on-call internal medicine resident.   Ozell Kung MD 08/19/2024, 3:51 PM

## 2024-08-19 NOTE — Assessment & Plan Note (Signed)
 Chronic, recurrent. Reports lancinating chest pain localized to left chest. Not associated with activity. For instance was mowing lawn yesterday pain free but then had spasm after he sat down. Reproducible to palpation. Reassured that this is not cardiac pain but rather a sore chest muscle.

## 2024-08-19 NOTE — Assessment & Plan Note (Signed)
 Lab Results  Component Value Date   HGBA1C 6.0 08/19/2024   HGBA1C 10.4 (A) 05/11/2024   HGBA1C 7.2 (A) 01/06/2024   Remarkable improvement from 3 months ago with metformin  1000 mg bid and jardiance  10 mg daily. This is also despite some weight gain. Perhaps worth repeating at next follow-up to ensure this isn't spurious result. He has made positive lifestyle changes which could account for improvement. Continue metformin  and jardiance .

## 2024-08-19 NOTE — Assessment & Plan Note (Signed)
 Chronic, stable. Bilateral. Has had hip replacement for hip osteoarthritis. Suspect bilateral knee OA. Using tylenol , voltaren . Declines PT, joint injections. Limited use of ibuprofen  okay but only if pain is severe.

## 2024-08-19 NOTE — Progress Notes (Signed)
 Patient name: [name] Date of birth: 1961-12-08 Date of visit: 08/19/24  Subjective   Reason for visit: bilateral knee pain, diabetes, hypertension  Knee pain ongoing 3 years. Bilateral. Worse after long day on feet. Achy. Non-radiating. Sometimes function limiting. Has good days and bad days. No swelling or redness.  Working on [employer] modifications. Avoiding processed meats.  Current Outpatient Medications  Medication Instructions   Albuterol  Sulfate (PROAIR  RESPICLICK) 108 (90 Base) MCG/ACT AEPB 2 puffs, Inhalation, Every 6 hours PRN   allopurinol  (ZYLOPRIM ) 100 mg, Oral, Daily   amLODipine -olmesartan  (AZOR ) 10-40 MG tablet 1 tablet, Oral, Daily   atorvastatin  (LIPITOR) 40 mg, Oral, Daily   benzonatate  (TESSALON  PERLES) 100 mg, Oral, Every 6 hours PRN   empagliflozin  (JARDIANCE ) 10 mg, Oral, Daily before breakfast   glucose blood (FREESTYLE TEST STRIPS) test strip Use as instructed   indomethacin  (INDOCIN ) 50 MG capsule TAKE 1 CAPSULE BY MOUTH TWICE DAILY AS NEEDED   metFORMIN  (GLUCOPHAGE ) 1,000 mg, Oral, 2 times daily with meals   ReliOn Confirm Glucose Monitor 1 Units, Does not apply,  Once     Objective  Today's Vitals   08/19/24 1521 08/19/24 1522  BP: (!) 147/89 128/88  Pulse: 98   Temp: 97.7 F (36.5 C)   TempSrc: Oral   SpO2: 100%   Weight: 224 lb (101.6 kg)   Height: 5' 10 (1.778 m)   PainSc:  2   Body mass index is 32.14 kg/m.   Physical Exam Constitutional:      Appearance: Normal appearance.  Cardiovascular:     Rate and Rhythm: Normal rate and regular rhythm.  Pulmonary:     Effort: Pulmonary effort is normal. No respiratory distress.  Musculoskeletal:     Comments: Tenderness over lateral joint line bilateral knees  Skin:    General: Skin is warm and dry.  Neurological:     Mental Status: He is alert.     Cranial Nerves: No facial asymmetry.  Psychiatric:        Mood and Affect: Affect normal.        Speech: Speech normal.         Behavior: Behavior normal.    Diabetic Foot Exam - Simple   Simple Foot Form Diabetic Foot exam was performed with the following findings: Yes 08/19/2024  3:23 PM  Visual Inspection No deformities, no ulcerations, no other skin breakdown bilaterally: Yes Sensation Testing Intact to touch and monofilament testing bilaterally: Yes Pulse Check Posterior Tibialis and Dorsalis pulse intact bilaterally: Yes Comments       Assessment & Plan   Type 2 diabetes mellitus with proteinuria [facility]) Assessment & Plan: Lab Results  Component Value Date   HGBA1C 6.0 08/19/2024   HGBA1C 10.4 (A) 05/11/2024   HGBA1C 7.2 (A) 01/06/2024   Remarkable improvement from 3 months ago with metformin  1000 mg bid and jardiance  10 mg daily. This is also despite some weight gain. Perhaps worth repeating at next follow-up to ensure this isn't spurious result. He has made positive lifestyle changes which could account for improvement. Continue metformin  and jardiance .  Orders: -     POCT glycosylated hemoglobin (Hb A1C)  Essential hypertension Assessment & Plan: BP Readings from Last 3 Encounters:  08/19/24 128/88  08/05/24 (!) 145/85  07/15/24 (!) 144/83   Chronic, at goal. Checking blood pressure at home, seeing ~130-140 systolic. Initially higher in clinic today, repeated with home BP cuff and ~125-30 systolic, confirmed by side-by-side testing again with clinic BP cuff.  Continue amlodipine  olmesartan  10-40 mg. Avoid processed foods, work on weight loss.  Orders: -     Basic metabolic panel with GFR  Stiffness of ankle joint, left Assessment & Plan: Resolved.   Chest wall pain Assessment & Plan: Chronic, recurrent. Reports lancinating chest pain localized to left chest. Not associated with activity. For instance was mowing lawn yesterday pain free but then had spasm after he sat down. Reproducible to palpation. Reassured that this is not cardiac pain but rather a sore chest muscle.   Arthralgia  of both knees Assessment & Plan: Chronic, stable. Bilateral. Has had hip replacement for hip osteoarthritis. Suspect bilateral knee OA. Using tylenol , voltaren . Declines PT, joint injections. Limited use of ibuprofen  okay but only if pain is severe.   Other orders -     Glucose, capillary    Return in about 3 months (around 11/19/2024) for preventive medicine exam.  Ozell Kung MD 08/19/2024, 9:29 PM

## 2024-08-20 ENCOUNTER — Ambulatory Visit: Payer: Self-pay | Admitting: Student

## 2024-08-20 DIAGNOSIS — R7989 Other specified abnormal findings of blood chemistry: Secondary | ICD-10-CM

## 2024-08-20 LAB — BASIC METABOLIC PANEL WITH GFR
BUN/Creatinine Ratio: 22 (ref 10–24)
BUN: 50 mg/dL — ABNORMAL HIGH (ref 8–27)
CO2: 15 mmol/L — ABNORMAL LOW (ref 20–29)
Calcium: 10.6 mg/dL — ABNORMAL HIGH (ref 8.6–10.2)
Chloride: 105 mmol/L (ref 96–106)
Creatinine, Ser: 2.24 mg/dL — ABNORMAL HIGH (ref 0.76–1.27)
Glucose: 99 mg/dL (ref 70–99)
Potassium: 5.8 mmol/L — ABNORMAL HIGH (ref 3.5–5.2)
Sodium: 138 mmol/L (ref 134–144)
eGFR: 32 mL/min/1.73 — ABNORMAL LOW (ref >59)

## 2024-08-20 NOTE — Progress Notes (Signed)
 Called to discuss results including new renal insufficiency.   Notes increased urinary frequency lately but otherwise asymptomatic.   No recent changes to his medications. No recent NSAID use.   Recommend that he come back to the clinic early next week for repeat serum chemistry and urinalysis.  Hold metformin  over the weekend. Continue avoiding NSAIDs.

## 2024-08-22 NOTE — Telephone Encounter (Signed)
 Called again regarding worsening renal function, hyperkalemia, and acidosis.  He remains asymptomatic, feeling well. No decrease urine output, hematuria, heart palpitations.  Recommended ED evaluation for new renal insufficiency. He declines to have repeat laboratory workup completed this afternoon. Instead he'll go to the clinic tomorrow morning for repeat laboratory testing.  I counseled him about precautions necessitating ED evaluation including decreased urine output.  Ozell Kung MD 08/22/2024, 4:07 PM

## 2024-08-23 ENCOUNTER — Other Ambulatory Visit

## 2024-08-23 DIAGNOSIS — R7989 Other specified abnormal findings of blood chemistry: Secondary | ICD-10-CM

## 2024-08-23 NOTE — Progress Notes (Signed)
 Internal Medicine Clinic Attending  Case discussed with the resident at the time of the visit.  We reviewed the resident's history and exam and pertinent patient test results.  I agree with the assessment, diagnosis, and plan of care documented in the resident's note.

## 2024-08-24 ENCOUNTER — Telehealth: Payer: Self-pay | Admitting: Student

## 2024-08-24 ENCOUNTER — Ambulatory Visit: Payer: Self-pay | Admitting: Student

## 2024-08-24 DIAGNOSIS — D649 Anemia, unspecified: Secondary | ICD-10-CM

## 2024-08-24 DIAGNOSIS — N289 Disorder of kidney and ureter, unspecified: Secondary | ICD-10-CM

## 2024-08-24 LAB — MICROALBUMIN / CREATININE URINE RATIO
Creatinine, Urine: 83.7 mg/dL
Microalb/Creat Ratio: 514 mg/g{creat} — ABNORMAL HIGH (ref 0–29)
Microalbumin, Urine: 429.9 ug/mL

## 2024-08-24 LAB — BASIC METABOLIC PANEL WITH GFR
BUN/Creatinine Ratio: 18 (ref 10–24)
BUN: 24 mg/dL (ref 8–27)
CO2: 20 mmol/L (ref 20–29)
Calcium: 10.9 mg/dL — ABNORMAL HIGH (ref 8.6–10.2)
Chloride: 106 mmol/L (ref 96–106)
Creatinine, Ser: 1.3 mg/dL — ABNORMAL HIGH (ref 0.76–1.27)
Glucose: 95 mg/dL (ref 70–99)
Potassium: 4.7 mmol/L (ref 3.5–5.2)
Sodium: 141 mmol/L (ref 134–144)
eGFR: 61 mL/min/1.73 (ref >59)

## 2024-08-24 LAB — MICROSCOPIC EXAMINATION
Bacteria, UA: NONE SEEN
Casts: NONE SEEN /LPF
Epithelial Cells (non renal): NONE SEEN /HPF (ref 0–10)
RBC, Urine: NONE SEEN /HPF (ref 0–2)
WBC, UA: NONE SEEN /HPF (ref 0–5)

## 2024-08-24 LAB — URINALYSIS, ROUTINE W REFLEX MICROSCOPIC
Bilirubin, UA: NEGATIVE
Ketones, UA: NEGATIVE
Leukocytes,UA: NEGATIVE
Nitrite, UA: NEGATIVE
Specific Gravity, UA: 1.019 (ref 1.005–1.030)
Urobilinogen, Ur: 0.2 mg/dL (ref 0.2–1.0)
pH, UA: 5.5 (ref 5.0–7.5)

## 2024-08-24 NOTE — Telephone Encounter (Addendum)
 Received a page from [employer] regarding this patient's BMP that was drawn yesterday, 8/25. [facility] [employer] noted that the page was sent out as a mistake for critical lab values; this patient's BMP showed normal Potassium and improved Scr.   Primary MD who saw last at the last OV was notified.

## 2024-08-26 NOTE — Telephone Encounter (Signed)
 Creatine and potassium improved after holding ARB over the weekend. He restarted this within the last couple of weeks after a period of non-adherence. I think the renal insufficiency and hyperkalemia could be attributed to restarting the ARB. I want him to return to the clinic for hypertension, restarting ARB at lower dose or trying a different agent. Considered paraproteinemia given incidental hypercalcemia. He also had mild anemia in 2023 without repeat hemoglobin level.   Orders Placed This Encounter  Procedures   CBC with Diff    Standing Status:   Future    Expiration Date:   08/26/2025   Comprehensive metabolic panel with GFR    Standing Status:   Future    Expiration Date:   08/26/2025   Multiple Myeloma Panel (SPEP&IFE w/QIG)    Standing Status:   Future    Expiration Date:   08/26/2025   PE and FLC, Serum    Standing Status:   Future    Expiration Date:   08/26/2025   Ozell Kung MD 08/26/2024, 11:58 AM

## 2024-09-08 ENCOUNTER — Other Ambulatory Visit: Payer: Self-pay | Admitting: Student

## 2024-09-08 MED ORDER — STEGLATRO 5 MG PO TABS: 5.0000 mg | ORAL_TABLET | Freq: Every day | ORAL | 3 refills | Status: DC

## 2024-09-08 NOTE — Progress Notes (Signed)
 Changed patient's Jardiance  to steglarto 5mg  for cost saving.  Patient aware of change, jardiance  d/c and seglarto 5mg  sent to pharmacy.

## 2024-09-09 ENCOUNTER — Other Ambulatory Visit (HOSPITAL_COMMUNITY): Payer: Self-pay

## 2024-09-09 ENCOUNTER — Telehealth: Payer: Self-pay | Admitting: *Deleted

## 2024-09-09 DIAGNOSIS — E1129 Type 2 diabetes mellitus with other diabetic kidney complication: Secondary | ICD-10-CM

## 2024-09-09 MED ORDER — EMPAGLIFLOZIN 10 MG PO TABS: 10.0000 mg | ORAL_TABLET | Freq: Every day | ORAL | 11 refills | Status: AC

## 2024-09-09 NOTE — Progress Notes (Signed)
 Contacted pharmacy who confirmed copay for Steglatro  was $91 for 1 mo supply and Jardiance  was previous $10/30ds. PCP received notification from insurance that Steglatro  would now be preferred, however, pharmacy confirmed that patient had been getting Jardiance  for $10/mo and his insurance has not changed. They were not able to run a test claim for Jardiance  because there were no refills left on file. Will send refills for Jardiance  and follow-up on cost.  Patient aware that we are investigating cost/coverage.   Lorain Baseman, PharmD [facility] [phone]

## 2024-09-09 NOTE — Telephone Encounter (Signed)
 Will forward to Dr. Kandis and to C. Everette the Pharmacy Tech for the Clinic who may have some ideas about the medication.  Copied from CRM #[id number]. Topic: Clinical - Prescription Issue >> Sep 09, 2024  3:26 PM Farrel B wrote: Reason for CRM: patient is calling from [phone] in regards to his jardiance  and has been switched over to Steglatro  he states that he had a card for jardiance  that only cost him 12.00 he stated with the medication that the insurance changed him to is going to cost him 91.00. I gave him some information on [employer] with is a company that offers patient assistance and advised him to speak with the pharmacy about the GoodRX or BuzzRx patient is needing to know what he can do to cut the cost of this medication please call patient to advise

## 2024-09-10 ENCOUNTER — Other Ambulatory Visit (HOSPITAL_COMMUNITY): Payer: Self-pay

## 2024-09-10 NOTE — Telephone Encounter (Signed)
 [facility] pharmacy confirmed that they can fil Jardiance  for $10 for a 30ds using the patients insurance + coupon card. Steglatro  has been discontinued.   Lorain Baseman, PharmD [facility] [phone]

## 2024-09-13 ENCOUNTER — Other Ambulatory Visit: Payer: Self-pay | Admitting: Student

## 2024-09-13 NOTE — Progress Notes (Signed)
 Steglarto 5mg  was actually more expensive than Jardicance. Will continue with Jardiance  10 mg. Patient notified.

## 2024-11-18 ENCOUNTER — Ambulatory Visit: Admitting: Student

## 2024-11-22 ENCOUNTER — Ambulatory Visit: Admitting: Student

## 2024-11-22 ENCOUNTER — Other Ambulatory Visit: Payer: Self-pay

## 2024-11-22 VITALS — BP 160/98 | HR 100 | Temp 97.9°F | Ht 70.0 in | Wt 226.8 lb

## 2024-11-22 DIAGNOSIS — N289 Disorder of kidney and ureter, unspecified: Secondary | ICD-10-CM

## 2024-11-22 DIAGNOSIS — E1129 Type 2 diabetes mellitus with other diabetic kidney complication: Secondary | ICD-10-CM

## 2024-11-22 DIAGNOSIS — M25561 Pain in right knee: Secondary | ICD-10-CM

## 2024-11-22 DIAGNOSIS — D649 Anemia, unspecified: Secondary | ICD-10-CM

## 2024-11-22 DIAGNOSIS — I10 Essential (primary) hypertension: Secondary | ICD-10-CM

## 2024-11-22 LAB — POCT GLYCOSYLATED HEMOGLOBIN (HGB A1C): HbA1c, POC (controlled diabetic range): 6.5 % (ref 0.0–7.0)

## 2024-11-22 LAB — GLUCOSE, CAPILLARY: Glucose-Capillary: 110 mg/dL — ABNORMAL HIGH (ref 70–99)

## 2024-11-22 NOTE — Patient Instructions (Addendum)
 Continue your medications as prescribed. Careful with the indomethacin  as it can put a strain on your kidneys. I'll call you about your results.  Remember to bring all of the medications that you take (including over the counter medications and supplements) with you to every clinic visit.  This after visit summary is an important review of tests, referrals, and medication changes that were discussed during your visit. If you have questions or concerns, call [phone]. Outside of clinic business hours, call the main hospital at [phone] and ask the operator for the on-call internal medicine resident.   Ozell Kung MD 11/22/2024, 4:42 PM

## 2024-11-22 NOTE — Assessment & Plan Note (Addendum)
 Lab Results  Component Value Date   HGBA1C 6.5 11/22/2024   HGBA1C 6.0 08/19/2024   HGBA1C 10.4 (A) 05/11/2024   Chronic, stable. Continue metformin  1000 mg BID and Jardiance  10 mg daily.  Orders:   POC Hbg A1C   Glucose, capillary

## 2024-11-22 NOTE — Assessment & Plan Note (Signed)
 Chronic and stable. Likely osteoarthritis. Declines joint injections. Recommend using indomethacin  sparingly given history of renal insufficiency and hypertension.

## 2024-11-22 NOTE — Assessment & Plan Note (Addendum)
 BP Readings from Last 3 Encounters:  11/22/24 (!) 160/98  08/19/24 128/88  08/05/24 (!) 145/85   Chronic, hypertensive in clinic today. Corroborated with home BP cuff. He recently restarted his amlodipine -olmesartan  10-40 mg daily. Continue for now. Check serum chemistry given recent renal insufficiency associated with ARB.  Orders:   Comprehensive metabolic panel with GFR

## 2024-11-22 NOTE — Progress Notes (Signed)
 Patient name: [name] Date of birth: [birth year] Date of visit: 11/22/24  Subjective:  Reason for visit: Routine Checkup , Diabetes, and Hypertension  Discussed the use of AI scribe software for clinical note transcription with the patient, who gave verbal consent to proceed.  History of Present Illness   [name] is a 63 year old male with hypertension and gout who presents for follow-up of his blood pressure and kidney function.  Hypertension and renal function - Blood pressure is 144/103 mmHg at today's visit - Restarted amlodipine  and olmesartan  after previous adjustments due to abnormal kidney function tests - History of decreased kidney function - No chest pain or shortness of breath  Gout and musculoskeletal symptoms - Swelling and pain in knees and big toe attributed to gout, alleviated with indomethacin  - Has not used indomethacin  for several days - Continues allopurinol  for gout management - Difficulty bending over to tie shoes  Diabetes mellitus - Takes metformin  and Jardiance  for diabetes management  Hyperlipidemia - Takes atorvastatin  for cholesterol management, unsure about refills  Recent infectious symptoms - Recently had a mild cold despite vaccinations for influenza, pneumonia, and COVID-19  Other historical laboratory abnormalities - History of anemia - History of hypercalcemia         Current Outpatient Medications  Medication Instructions   allopurinol  (ZYLOPRIM ) 100 mg, Oral, Daily   amLODipine -olmesartan  (AZOR ) 10-40 MG tablet 1 tablet, Oral, Daily   atorvastatin  (LIPITOR) 40 mg, Oral, Daily   benzonatate  (TESSALON  PERLES) 100 mg, Oral, Every 6 hours PRN   empagliflozin  (JARDIANCE ) 10 mg, Oral, Daily   glucose blood (FREESTYLE TEST STRIPS) test strip Use as instructed   indomethacin  (INDOCIN ) 50 MG capsule TAKE 1 CAPSULE BY MOUTH TWICE DAILY AS NEEDED   metFORMIN  (GLUCOPHAGE ) 1,000 mg, Oral, 2 times daily with meals   ReliOn  Confirm Glucose Monitor 1 Units, Does not apply,  Once    Objective: Today's Vitals   11/22/24 1557  BP: (!) 160/98  Pulse: 100  Temp: 97.9 F (36.6 C)  SpO2: 97%  Weight: 226 lb 12.8 oz (102.9 kg)  Height: 5' 10 (1.778 m)  Body mass index is 32.54 kg/m.   Physical Exam Constitutional:      Appearance: Normal appearance.  Cardiovascular:     Rate and Rhythm: Normal rate and regular rhythm.     Pulses: Normal pulses.     Heart sounds: No murmur heard. Pulmonary:     Effort: Pulmonary effort is normal. No respiratory distress.  Skin:    General: Skin is warm and dry.  Neurological:     Mental Status: He is alert.     Cranial Nerves: No facial asymmetry.  Psychiatric:        Mood and Affect: Affect normal.        Speech: Speech normal.        Behavior: Behavior normal.     Assessment & Plan Type 2 diabetes mellitus with proteinuria ([facility]) Lab Results  Component Value Date   HGBA1C 6.5 11/22/2024   HGBA1C 6.0 08/19/2024   HGBA1C 10.4 (A) 05/11/2024   Chronic, stable. Continue metformin  1000 mg BID and Jardiance  10 mg daily.  Orders:   POC Hbg A1C   Glucose, capillary   Anemia, unspecified type Lab Results  Component Value Date   HGB 12.5 (L) 07/09/2022   HGB 13.4 12/17/2021   HGB 15.8 01/09/2021   Mild anemia noted in July without follow-up. Check CBC. Orders:   Multiple Myeloma Panel (SPEP&IFE w/QIG)  CBC with Diff  Renal insufficiency Associated with ARB usage, which he restarted recently. Check creatinine today. Given hypercalcemia and mild anemia noted on prior labs will check for multiple myeloma. Orders:   Multiple Myeloma Panel (SPEP&IFE w/QIG)   Comprehensive metabolic panel with GFR   Kappa/lambda light chains  Essential hypertension BP Readings from Last 3 Encounters:  11/22/24 (!) 160/98  08/19/24 128/88  08/05/24 (!) 145/85   Chronic, hypertensive in clinic today. Corroborated with home BP cuff. He recently restarted his  amlodipine -olmesartan  10-40 mg daily. Continue for now. Check serum chemistry given recent renal insufficiency associated with ARB.  Orders:   Comprehensive metabolic panel with GFR   Arthralgia of both knees Chronic and stable. Likely osteoarthritis. Declines joint injections. Recommend using indomethacin  sparingly given history of renal insufficiency and hypertension.       Return in about 3 months (around 02/22/2025).  Ozell Kung MD 11/22/2024, 5:42 PM

## 2024-11-26 LAB — CBC WITH DIFFERENTIAL/PLATELET
Basophils Absolute: 0.1 x10E3/uL (ref 0.0–0.2)
Basos: 1 %
EOS (ABSOLUTE): 0.1 x10E3/uL (ref 0.0–0.4)
Eos: 2 %
Hematocrit: 43.3 % (ref 37.5–51.0)
Hemoglobin: 14.6 g/dL (ref 13.0–17.7)
Immature Grans (Abs): 0 x10E3/uL (ref 0.0–0.1)
Immature Granulocytes: 0 %
Lymphocytes Absolute: 3.3 x10E3/uL — ABNORMAL HIGH (ref 0.7–3.1)
Lymphs: 38 %
MCH: 31.1 pg (ref 26.6–33.0)
MCHC: 33.7 g/dL (ref 31.5–35.7)
MCV: 92 fL (ref 79–97)
Monocytes Absolute: 0.7 x10E3/uL (ref 0.1–0.9)
Monocytes: 8 %
Neutrophils Absolute: 4.5 x10E3/uL (ref 1.4–7.0)
Neutrophils: 51 %
Platelets: 350 x10E3/uL (ref 150–450)
RBC: 4.69 x10E6/uL (ref 4.14–5.80)
RDW: 14.1 % (ref 11.6–15.4)
WBC: 8.8 x10E3/uL (ref 3.4–10.8)

## 2024-11-26 LAB — KAPPA/LAMBDA LIGHT CHAINS
Ig Kappa Free Light Chain: 33.2 mg/L — ABNORMAL HIGH (ref 3.3–19.4)
Ig Lambda Free Light Chain: 19.7 mg/L (ref 5.7–26.3)
KAPPA/LAMBDA RATIO: 1.69 — ABNORMAL HIGH (ref 0.26–1.65)

## 2024-11-26 LAB — MULTIPLE MYELOMA PANEL, SERUM
Albumin SerPl Elph-Mcnc: 4.1 g/dL (ref 2.9–4.4)
Albumin/Glob SerPl: 1 (ref 0.7–1.7)
Alpha 1: 0.2 g/dL (ref 0.0–0.4)
Alpha2 Glob SerPl Elph-Mcnc: 1 g/dL (ref 0.4–1.0)
B-Globulin SerPl Elph-Mcnc: 1.5 g/dL — ABNORMAL HIGH (ref 0.7–1.3)
Gamma Glob SerPl Elph-Mcnc: 1.5 g/dL (ref 0.4–1.8)
Globulin, Total: 4.2 g/dL — ABNORMAL HIGH (ref 2.2–3.9)
IgG (Immunoglobin G), Serum: 1400 mg/dL (ref 603–1613)
IgM (Immunoglobulin M), Srm: 97 mg/dL (ref 20–172)
Immunoglobulin A, (IgA) QN, Serum: 379 mg/dL (ref 61–437)

## 2024-11-26 LAB — COMPREHENSIVE METABOLIC PANEL WITH GFR
ALT: 29 IU/L (ref 0–44)
AST: 27 IU/L (ref 0–40)
Albumin: 5 g/dL — ABNORMAL HIGH (ref 3.9–4.9)
Alkaline Phosphatase: 119 IU/L (ref 47–123)
BUN/Creatinine Ratio: 12 (ref 10–24)
BUN: 13 mg/dL (ref 8–27)
Bilirubin Total: 0.8 mg/dL (ref 0.0–1.2)
CO2: 20 mmol/L (ref 20–29)
Calcium: 10.8 mg/dL — ABNORMAL HIGH (ref 8.6–10.2)
Chloride: 103 mmol/L (ref 96–106)
Creatinine, Ser: 1.05 mg/dL (ref 0.76–1.27)
Globulin, Total: 3.3 g/dL (ref 1.5–4.5)
Glucose: 99 mg/dL (ref 70–99)
Potassium: 4.5 mmol/L (ref 3.5–5.2)
Sodium: 141 mmol/L (ref 134–144)
Total Protein: 8.3 g/dL (ref 6.0–8.5)
eGFR: 79 mL/min/1.73 (ref >59)

## 2024-11-29 ENCOUNTER — Ambulatory Visit: Payer: Self-pay | Admitting: Student

## 2024-12-09 NOTE — Progress Notes (Signed)
 Internal Medicine Clinic Attending  Case discussed with the resident at the time of the visit.  We reviewed the resident's history and exam and pertinent patient test results.  I agree with the assessment, diagnosis, and plan of care documented in the resident's note.

## 2025-01-17 ENCOUNTER — Ambulatory Visit: Payer: Self-pay

## 2025-01-17 VITALS — BP 139/86 | HR 96 | Temp 97.6°F | Ht 70.0 in | Wt 231.4 lb

## 2025-01-17 DIAGNOSIS — M7989 Other specified soft tissue disorders: Secondary | ICD-10-CM

## 2025-01-17 DIAGNOSIS — M25562 Pain in left knee: Secondary | ICD-10-CM | POA: Diagnosis not present

## 2025-01-17 DIAGNOSIS — M25561 Pain in right knee: Secondary | ICD-10-CM | POA: Diagnosis not present

## 2025-01-17 DIAGNOSIS — D1722 Benign lipomatous neoplasm of skin and subcutaneous tissue of left arm: Secondary | ICD-10-CM | POA: Diagnosis not present

## 2025-01-17 DIAGNOSIS — M1A4710 Other secondary chronic gout, right ankle and foot, without tophus (tophi): Secondary | ICD-10-CM

## 2025-01-17 DIAGNOSIS — M109 Gout, unspecified: Secondary | ICD-10-CM

## 2025-01-17 MED ORDER — DICLOFENAC SODIUM 1 % EX GEL: 4.0000 g | Freq: Four times a day (QID) | CUTANEOUS | 2 refills | Status: AC

## 2025-01-17 MED ORDER — ALLOPURINOL 100 MG PO TABS: 100.0000 mg | ORAL_TABLET | Freq: Every day | ORAL | 3 refills | Status: AC

## 2025-01-17 NOTE — Patient Instructions (Addendum)
 It was wonderful seeing you today!   1) For your knee pain, be on the lookout for someone to call you from the orthopedic doctor office. I also sent in some Voltaren  gel to your pharmacy.   2) For your swelling, please come back tomorrow to get labs. Please elevate your feet whenever you're sitting down, wear compression stockings daily, and limit your salt and sugar intake.   3) For your gout, please come back tomorrow to get labs. Please take allopurinol  everyday to prevent flares. Please limit your alcohol, red meat and salt intake.   If you have any questions please feel free to the call the clinic at anytime at [phone].  Have a blessed day,  Dr. Charmayne

## 2025-01-18 ENCOUNTER — Other Ambulatory Visit

## 2025-01-18 ENCOUNTER — Other Ambulatory Visit: Payer: Self-pay | Admitting: *Deleted

## 2025-01-18 DIAGNOSIS — M7989 Other specified soft tissue disorders: Secondary | ICD-10-CM

## 2025-01-18 DIAGNOSIS — M1A4710 Other secondary chronic gout, right ankle and foot, without tophus (tophi): Secondary | ICD-10-CM

## 2025-01-18 NOTE — Assessment & Plan Note (Signed)
 Presented today with bilateral lower extremity edema.  Patient says this has been ongoing for some time and has required him to miss work multiple times in the past.  His wife says that the swelling has increased and is different than what it is previously he is swelling even on his days when he does not work and the swelling is worse around his ankles over the last month or so.  Patient is having an acute gout flare in his right great toe and he has had this before, he started his rescue indomethacin  and says that it is improving.  He takes allopurinol  100 mg daily with indomethacin  50 mg twice daily as needed for acute flares.  He had originally said he was taking his allopurinol  although towards the end of the visit his wife realized that allopurinol  was not in his medication bag and he realized that he had not picked it up in several months and was actually not taking it.  Per dispense history he has not refilled it since May/2025.  Addition to not taking his allopurinol  he routinely eats steaks and drinks beer about every night.  He has several risk factors for gout flares we discussed trying to cut down on these foods as well as remaining adherent to his allopurinol .  I was initially worried that his swelling was due to an alternate cause although no knowing that he has not been taking the allopurinol  I suspect that this is contributing to his bilateral lower extremity swelling.  Will still check a uric acid today and can increase allopurinol  dose in the future if he has a flare while adhering to the medication.  Will have him follow-up in 4 weeks to ensure a solution of symptoms and medication adherence.  Lab had closed at the conclusion of our visit but he said that he would come back the following day to complete the labs. Plan Follow-up uric acid Encourage patient to adhere to allopurinol  100 mg every day Consider increasing allopurinol  if has flare while taking allopurinol  consistently Follow-up  dietary modifications Orders:   Uric acid; Future   allopurinol  (ZYLOPRIM ) 100 MG tablet; Take 1 tablet (100 mg total) by mouth daily.

## 2025-01-18 NOTE — Assessment & Plan Note (Signed)
 Has known history of DDD and chronic bilateral knee pain.  He has injured his back and knee at work and has done physical therapy for his back in the past.  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  He says that his knee pain is making it difficult for him to be active in and outside of work and he has been more sedentary lately.  We discussed that reducing excess body weight and controlling diabetes is very important for osteoarthritis.  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  I recommended that he see an orthopedic doctor for recommendations such as repeat imaging and knee injections or potential workup for bone infarcts. Plan Placed referral today Voltaren  gel as needed Orders:   Ambulatory referral to Orthopedic Surgery

## 2025-01-18 NOTE — Addendum Note (Signed)
 Addended by: ANTONE DWAYNE SAILOR on: 01/18/2025 03:54 PM   Modules accepted: Orders

## 2025-01-18 NOTE — Addendum Note (Signed)
 Addended by: ANTONE DWAYNE SAILOR on: 01/18/2025 03:48 PM   Modules accepted: Orders

## 2025-01-18 NOTE — Progress Notes (Signed)
 "  Established Patient Office Visit  Subjective   Patient ID: [name], male    DOB: February 16, 1961  Age: 64 y.o. MRN: [id number]  HPI LOV in 10/2024.  Patient here for 94-month medication and diabetes follow-up.  He is currently having a gout flare and has increased bilateral lower extremity swelling as well as chronic bilateral knee pain.  Accompanied by spouse. Past Medical History:  Diagnosis Date   Allergy    Arthritis    Chronic lower back pain    COVID-19 virus infection 01/10/2021   Diabetes mellitus without complication (HCC)    GERD (gastroesophageal reflux disease)    Gout    Hyperlipidemia    Hyperosmolar hyperglycemic state (HHS) (HCC) 01/10/2021   Hypertension    Seasonal allergies    Stiffness of ankle joint, left 07/16/2024   Tobacco use disorder 08/31/2013        Objective:     BP 139/86 (BP Location: Right Arm, Patient Position: Sitting, Cuff Size: Normal)   Pulse 96   Temp 97.6 F (36.4 C) (Oral)   Ht 5' 10 (1.778 m)   Wt 231 lb 6.4 oz (105 kg)   SpO2 98%   BMI 33.20 kg/m  BP Readings from Last 3 Encounters:  01/17/25 139/86  11/22/24 (!) 160/98  08/19/24 128/88   Wt Readings from Last 3 Encounters:  01/17/25 231 lb 6.4 oz (105 kg)  11/22/24 226 lb 12.8 oz (102.9 kg)  08/19/24 224 lb (101.6 kg)      Physical Exam Vitals reviewed.  Constitutional:      Appearance: Normal appearance.  Pulmonary:     Effort: Pulmonary effort is normal.  Musculoskeletal:        General: No swelling, tenderness, deformity or signs of injury.     Right lower leg: Edema present.     Left lower leg: Edema present.     Right foot: Swelling present.     Left foot: Swelling present.       Feet:     Comments: Read, hot and swollen great toe  Mobile, soft, nontender subcutaneous mass below olecranon process  Skin:    General: Skin is warm.  Neurological:     General: No focal deficit present.     Mental Status: He is alert and oriented to person, place,  and time.  Psychiatric:        Mood and Affect: Mood normal.        Behavior: Behavior normal.       No results found for any visits on 01/17/25.  Last metabolic panel Lab Results  Component Value Date   GLUCOSE 99 11/22/2024   NA 141 11/22/2024   K 4.5 11/22/2024   CL 103 11/22/2024   CO2 20 11/22/2024   BUN 13 11/22/2024   CREATININE 1.05 11/22/2024   EGFR 79 11/22/2024   CALCIUM  10.8 (H) 11/22/2024   PROT 8.3 11/22/2024   ALBUMIN 5.0 (H) 11/22/2024   LABGLOB 4.2 (H) 11/22/2024   LABGLOB 3.3 11/22/2024   AGRATIO 1.4 02/10/2023   BILITOT 0.8 11/22/2024   ALKPHOS 119 11/22/2024   AST 27 11/22/2024   ALT 29 11/22/2024   ANIONGAP 9 02/18/2023   Last hemoglobin A1c Lab Results  Component Value Date   HGBA1C 6.5 11/22/2024      The 10-year ASCVD risk score (Arnett DK, et al., 2019) is: 27.9%    Assessment & Plan:   Assessment & Plan 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  Patient says this has been ongoing for some time and has required him to miss work multiple times in the past.  His wife says that the swelling has increased and is different than what it is previously he is swelling even on his days when he does not work and the swelling is worse around his ankles over the last month or so.  Patient is having an acute gout flare in his right great toe and he has had this before, he started his rescue indomethacin  and says that it is improving.  He takes allopurinol  100 mg daily with indomethacin  50 mg twice daily as needed for acute flares.  He had originally said he was taking his allopurinol  although towards the end of the visit his wife realized that allopurinol  was not in his medication bag and he realized that he had not picked it up in several months and was actually not taking it.  Per dispense history he has not refilled it since May/2025.  Addition to not taking  his allopurinol  he routinely eats steaks and drinks beer about every night.  He has several risk factors for gout flares we discussed trying to cut down on these foods as well as remaining adherent to his allopurinol .  I was initially worried that his swelling was due to an alternate cause although no knowing that he has not been taking the allopurinol  I suspect that this is contributing to his bilateral lower extremity swelling.  Will still check a uric acid today and can increase allopurinol  dose in the future if he has a flare while adhering to the medication.  Will have him follow-up in 4 weeks to ensure a solution of symptoms and medication adherence.  Lab had closed at the conclusion of our visit but he said that he would come back the following day to complete the labs. Plan Follow-up uric acid Encourage patient to adhere to allopurinol  100 mg every day Consider increasing allopurinol  if has flare while taking allopurinol  consistently Follow-up dietary modifications Orders:   Uric acid; Future   allopurinol  (ZYLOPRIM ) 100 MG tablet; Take 1 tablet (100 mg total) by mouth daily.  Arthralgia of both knees Has known history of DDD and chronic bilateral knee pain.  He has injured his back and knee at work and has done physical therapy for his back in the past.  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  He says that his knee pain is making it difficult for him to be active in and outside of work and he has been more sedentary lately.  We discussed that reducing excess body weight and controlling diabetes is very important for osteoarthritis.  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  I recommended that he see an orthopedic doctor for recommendations such as repeat imaging and knee injections or potential  workup for bone infarcts. Plan Placed referral today Voltaren  gel as needed Orders:   Ambulatory referral to Orthopedic Surgery  Leg swelling See above.  He has several risk factors for bilateral lower extremity swelling including alcohol use disorder, central adiposity, recurrent gout attacks, osteoarthritis, hypertension on amlodipine  and elevated urine microalbumin/creatinine (514, 4 months ago) with known hypertension and hypertriglyceridemia.  I favor that his presentation is  less likely due to amlodipine  because he has been on this for several years and was recently on 10 mg and now he is only on 5.  I suspect this is due to obesity and diabetes as well as acute gouty attack.  Will get a urine protein creatinine ratio today to assess for nephrotic syndrome.  Recommended he minimize salt, wear compression stockings, and elevate his feet.  Will have close follow-up in 4 weeks to assess swelling Orders:   Protein / Creatinine Ratio, Urine; Future  Lipoma of left upper extremity He is not sure when this nodule first arrived but first noticed it after bumping it while at work.  It does not bother him, he has no symptoms associated with it.  I suspect this is a lipoma, obtained a image for future reference.  Discussed with patient the benign etiology and he prefers to monitor at this time.  Discussed with him to let us  know if it rapidly changes in size or tenderness. Plan Continue to monitor     Return in about 4 weeks (around 02/14/2025) for follow up .    Viktoria King, DO "

## 2025-01-19 NOTE — Progress Notes (Signed)
 Internal Medicine Clinic Attending  Case discussed with the resident at the time of the visit.  We reviewed the resident's history and exam and pertinent patient test results.  I agree with the assessment, diagnosis, and plan of care documented in the resident's note.

## 2025-01-20 LAB — PROTEIN / CREATININE RATIO, URINE
Creatinine, Urine: 105.4 mg/dL
Protein, Ur: 83.8 mg/dL
Protein/Creat Ratio: 795 mg/g{creat} — AB (ref 0–200)

## 2025-01-20 LAB — URIC ACID: Uric Acid: 8.9 mg/dL — ABNORMAL HIGH (ref 3.8–8.4)
